# Patient Record
Sex: Female | Born: 1940 | Race: White | Hispanic: No | Smoking: Never smoker
Health system: Southern US, Community
[De-identification: ages and names within clinical notes are randomized; demographics above are authoritative.]

## PROBLEM LIST (undated history)

## (undated) DIAGNOSIS — I2510 Atherosclerotic heart disease of native coronary artery without angina pectoris: Secondary | ICD-10-CM

## (undated) DIAGNOSIS — C801 Malignant (primary) neoplasm, unspecified: Secondary | ICD-10-CM

## (undated) DIAGNOSIS — F329 Major depressive disorder, single episode, unspecified: Secondary | ICD-10-CM

## (undated) DIAGNOSIS — E785 Hyperlipidemia, unspecified: Secondary | ICD-10-CM

## (undated) DIAGNOSIS — E669 Obesity, unspecified: Secondary | ICD-10-CM

## (undated) DIAGNOSIS — Z923 Personal history of irradiation: Secondary | ICD-10-CM

## (undated) DIAGNOSIS — L821 Other seborrheic keratosis: Secondary | ICD-10-CM

## (undated) DIAGNOSIS — F32A Depression, unspecified: Secondary | ICD-10-CM

## (undated) DIAGNOSIS — I519 Heart disease, unspecified: Secondary | ICD-10-CM

## (undated) DIAGNOSIS — Z780 Asymptomatic menopausal state: Secondary | ICD-10-CM

## (undated) DIAGNOSIS — R7309 Other abnormal glucose: Secondary | ICD-10-CM

## (undated) DIAGNOSIS — Z803 Family history of malignant neoplasm of breast: Secondary | ICD-10-CM

## (undated) DIAGNOSIS — E538 Deficiency of other specified B group vitamins: Secondary | ICD-10-CM

## (undated) DIAGNOSIS — C50919 Malignant neoplasm of unspecified site of unspecified female breast: Secondary | ICD-10-CM

## (undated) HISTORY — DX: Obesity, unspecified: E66.9

## (undated) HISTORY — DX: Other seborrheic keratosis: L82.1

## (undated) HISTORY — DX: Depression, unspecified: F32.A

## (undated) HISTORY — DX: Malignant neoplasm of unspecified site of unspecified female breast: C50.919

## (undated) HISTORY — DX: Atherosclerotic heart disease of native coronary artery without angina pectoris: I25.10

## (undated) HISTORY — DX: Family history of malignant neoplasm of breast: Z80.3

## (undated) HISTORY — DX: Hyperlipidemia, unspecified: E78.5

## (undated) HISTORY — DX: Deficiency of other specified B group vitamins: E53.8

## (undated) HISTORY — DX: Asymptomatic menopausal state: Z78.0

## (undated) HISTORY — PX: TUBAL LIGATION: SHX77

## (undated) HISTORY — PX: TONSILLECTOMY: SUR1361

## (undated) HISTORY — DX: Other abnormal glucose: R73.09

## (undated) HISTORY — DX: Heart disease, unspecified: I51.9

## (undated) HISTORY — PX: NECK SURGERY: SHX720

## (undated) HISTORY — DX: Major depressive disorder, single episode, unspecified: F32.9

## (undated) HISTORY — PX: CHOLECYSTECTOMY: SHX55

---

## 2001-12-06 ENCOUNTER — Other Ambulatory Visit: Admission: RE | Admit: 2001-12-06 | Discharge: 2001-12-06 | Payer: Self-pay | Admitting: Family Medicine

## 2001-12-28 HISTORY — PX: CARDIAC CATHETERIZATION: SHX172

## 2002-01-10 ENCOUNTER — Ambulatory Visit (HOSPITAL_COMMUNITY): Admission: RE | Admit: 2002-01-10 | Discharge: 2002-01-10 | Payer: Self-pay | Admitting: Cardiology

## 2003-08-16 ENCOUNTER — Other Ambulatory Visit: Admission: RE | Admit: 2003-08-16 | Discharge: 2003-08-16 | Payer: Self-pay | Admitting: Family Medicine

## 2003-10-03 ENCOUNTER — Ambulatory Visit (HOSPITAL_COMMUNITY): Admission: RE | Admit: 2003-10-03 | Discharge: 2003-10-03 | Payer: Self-pay | Admitting: Family Medicine

## 2003-10-09 ENCOUNTER — Inpatient Hospital Stay (HOSPITAL_COMMUNITY): Admission: EM | Admit: 2003-10-09 | Discharge: 2003-10-10 | Payer: Self-pay | Admitting: Emergency Medicine

## 2004-11-09 ENCOUNTER — Ambulatory Visit: Payer: Self-pay | Admitting: Family Medicine

## 2004-11-09 ENCOUNTER — Other Ambulatory Visit: Admission: RE | Admit: 2004-11-09 | Discharge: 2004-11-09 | Payer: Self-pay | Admitting: Family Medicine

## 2005-06-09 ENCOUNTER — Ambulatory Visit: Payer: Self-pay | Admitting: Family Medicine

## 2005-06-21 ENCOUNTER — Ambulatory Visit: Payer: Self-pay

## 2005-07-19 ENCOUNTER — Ambulatory Visit: Payer: Self-pay | Admitting: Family Medicine

## 2005-11-23 ENCOUNTER — Ambulatory Visit: Payer: Self-pay | Admitting: Family Medicine

## 2006-01-18 ENCOUNTER — Ambulatory Visit: Payer: Self-pay | Admitting: Family Medicine

## 2006-03-07 ENCOUNTER — Ambulatory Visit: Payer: Self-pay | Admitting: Family Medicine

## 2007-07-13 ENCOUNTER — Ambulatory Visit: Payer: Self-pay | Admitting: Family Medicine

## 2007-07-13 DIAGNOSIS — I2510 Atherosclerotic heart disease of native coronary artery without angina pectoris: Secondary | ICD-10-CM | POA: Insufficient documentation

## 2007-07-13 DIAGNOSIS — E785 Hyperlipidemia, unspecified: Secondary | ICD-10-CM | POA: Insufficient documentation

## 2007-07-13 DIAGNOSIS — F329 Major depressive disorder, single episode, unspecified: Secondary | ICD-10-CM

## 2007-08-21 ENCOUNTER — Ambulatory Visit: Payer: Self-pay | Admitting: Family Medicine

## 2007-08-25 LAB — CONVERTED CEMR LAB
ALT: 18 units/L (ref 0–35)
BUN: 12 mg/dL (ref 6–23)
Bilirubin, Direct: 0.1 mg/dL (ref 0.0–0.3)
Calcium: 8.9 mg/dL (ref 8.4–10.5)
Eosinophils Absolute: 0.1 10*3/uL (ref 0.0–0.6)
Eosinophils Relative: 2.1 % (ref 0.0–5.0)
GFR calc Af Amer: 92 mL/min
GFR calc non Af Amer: 76 mL/min
Glucose, Bld: 110 mg/dL — ABNORMAL HIGH (ref 70–99)
Lymphocytes Relative: 34.2 % (ref 12.0–46.0)
MCHC: 35.9 g/dL (ref 30.0–36.0)
MCV: 90.8 fL (ref 78.0–100.0)
Monocytes Relative: 9.6 % (ref 3.0–11.0)
Neutro Abs: 2.8 10*3/uL (ref 1.4–7.7)
Platelets: 241 10*3/uL (ref 150–400)

## 2007-09-04 ENCOUNTER — Ambulatory Visit: Payer: Self-pay | Admitting: Family Medicine

## 2007-09-08 LAB — CONVERTED CEMR LAB
Direct LDL: 159.3 mg/dL
HDL: 46.1 mg/dL (ref >39.0)
Triglycerides: 124 mg/dL (ref 0–149)

## 2007-10-10 ENCOUNTER — Ambulatory Visit: Payer: Self-pay | Admitting: Family Medicine

## 2007-10-10 ENCOUNTER — Other Ambulatory Visit: Admission: RE | Admit: 2007-10-10 | Discharge: 2007-10-10 | Payer: Self-pay | Admitting: Family Medicine

## 2007-10-10 ENCOUNTER — Encounter: Payer: Self-pay | Admitting: Family Medicine

## 2007-10-10 DIAGNOSIS — R7309 Other abnormal glucose: Secondary | ICD-10-CM

## 2007-10-16 ENCOUNTER — Encounter (INDEPENDENT_AMBULATORY_CARE_PROVIDER_SITE_OTHER): Payer: Self-pay | Admitting: *Deleted

## 2007-10-23 ENCOUNTER — Ambulatory Visit: Payer: Self-pay | Admitting: Family Medicine

## 2007-10-25 LAB — CONVERTED CEMR LAB
Hgb A1c MFr Bld: 5.9 % (ref 4.6–6.0)
Total CHOL/HDL Ratio: 3.9
Triglycerides: 122 mg/dL (ref 0–149)
VLDL: 24 mg/dL (ref 0–40)

## 2008-02-19 ENCOUNTER — Encounter: Payer: Self-pay | Admitting: Family Medicine

## 2008-02-21 ENCOUNTER — Encounter: Payer: Self-pay | Admitting: Family Medicine

## 2008-02-27 ENCOUNTER — Encounter (INDEPENDENT_AMBULATORY_CARE_PROVIDER_SITE_OTHER): Payer: Self-pay | Admitting: *Deleted

## 2008-10-10 ENCOUNTER — Emergency Department (HOSPITAL_COMMUNITY): Admission: EM | Admit: 2008-10-10 | Discharge: 2008-10-11 | Payer: Self-pay | Admitting: Emergency Medicine

## 2009-04-29 ENCOUNTER — Encounter: Payer: Self-pay | Admitting: Family Medicine

## 2009-05-08 ENCOUNTER — Encounter (INDEPENDENT_AMBULATORY_CARE_PROVIDER_SITE_OTHER): Payer: Self-pay | Admitting: *Deleted

## 2009-07-15 ENCOUNTER — Ambulatory Visit: Payer: Self-pay | Admitting: Family Medicine

## 2009-07-15 DIAGNOSIS — E669 Obesity, unspecified: Secondary | ICD-10-CM

## 2009-07-17 LAB — CONVERTED CEMR LAB
ALT: 19 units/L (ref 0–35)
AST: 23 units/L (ref 0–37)
Alkaline Phosphatase: 69 units/L (ref 39–117)
CO2: 31 meq/L (ref 19–32)
Chloride: 102 meq/L (ref 96–112)
Cholesterol: 293 mg/dL — ABNORMAL HIGH (ref 0–200)
Eosinophils Relative: 1.8 % (ref 0.0–5.0)
GFR calc non Af Amer: 75.75 mL/min (ref >60)
HCT: 43.1 % (ref 36.0–46.0)
Hemoglobin: 14.5 g/dL (ref 12.0–15.0)
Hgb A1c MFr Bld: 5.8 % (ref 4.6–6.5)
Lymphs Abs: 1.5 10*3/uL (ref 0.7–4.0)
Monocytes Relative: 9.4 % (ref 3.0–12.0)
Neutro Abs: 3.1 10*3/uL (ref 1.4–7.7)
Potassium: 4.2 meq/L (ref 3.5–5.1)
RBC: 4.55 M/uL (ref 3.87–5.11)
Sodium: 141 meq/L (ref 135–145)
TSH: 1.94 microintl units/mL (ref 0.35–5.50)
Total CHOL/HDL Ratio: 6
WBC: 5.2 10*3/uL (ref 4.5–10.5)

## 2009-07-29 ENCOUNTER — Ambulatory Visit: Payer: Self-pay | Admitting: Family Medicine

## 2009-08-04 ENCOUNTER — Encounter: Payer: Self-pay | Admitting: Family Medicine

## 2009-09-18 ENCOUNTER — Encounter: Payer: Self-pay | Admitting: Family Medicine

## 2009-09-19 ENCOUNTER — Ambulatory Visit (HOSPITAL_COMMUNITY)
Admission: RE | Admit: 2009-09-19 | Discharge: 2009-09-19 | Payer: Self-pay | Source: Home / Self Care | Admitting: General Surgery

## 2009-09-26 ENCOUNTER — Encounter
Admission: RE | Admit: 2009-09-26 | Discharge: 2009-12-25 | Payer: Self-pay | Source: Home / Self Care | Admitting: General Surgery

## 2009-09-30 ENCOUNTER — Encounter (INDEPENDENT_AMBULATORY_CARE_PROVIDER_SITE_OTHER): Payer: Self-pay

## 2009-10-01 ENCOUNTER — Ambulatory Visit: Payer: Self-pay | Admitting: Gastroenterology

## 2009-10-20 ENCOUNTER — Ambulatory Visit: Payer: Self-pay | Admitting: Gastroenterology

## 2009-10-20 LAB — HM COLONOSCOPY

## 2009-10-22 ENCOUNTER — Encounter: Payer: Self-pay | Admitting: Gastroenterology

## 2009-11-27 ENCOUNTER — Ambulatory Visit (HOSPITAL_COMMUNITY): Admission: RE | Admit: 2009-11-27 | Discharge: 2009-11-27 | Payer: Self-pay | Admitting: General Surgery

## 2009-12-25 ENCOUNTER — Encounter
Admission: RE | Admit: 2009-12-25 | Discharge: 2010-03-25 | Payer: Self-pay | Source: Home / Self Care | Admitting: General Surgery

## 2010-01-13 ENCOUNTER — Inpatient Hospital Stay (HOSPITAL_COMMUNITY)
Admission: RE | Admit: 2010-01-13 | Discharge: 2010-01-15 | Payer: Self-pay | Source: Home / Self Care | Admitting: General Surgery

## 2010-01-13 HISTORY — PX: LAPAROSCOPIC GASTRIC BANDING: SHX1100

## 2010-02-05 ENCOUNTER — Encounter: Payer: Self-pay | Admitting: Family Medicine

## 2010-04-28 ENCOUNTER — Encounter
Admission: RE | Admit: 2010-04-28 | Discharge: 2010-05-29 | Payer: Self-pay | Source: Home / Self Care | Admitting: General Surgery

## 2010-05-27 IMAGING — CR DG ABDOMEN 1V
1 series · 1 of 1 positions shown · non-contrast
Comparison: None.

CLINICAL DATA: Postop gastric banding procedure.  Nausea.

ABDOMEN - 1 VIEW

[t abdomen supine]
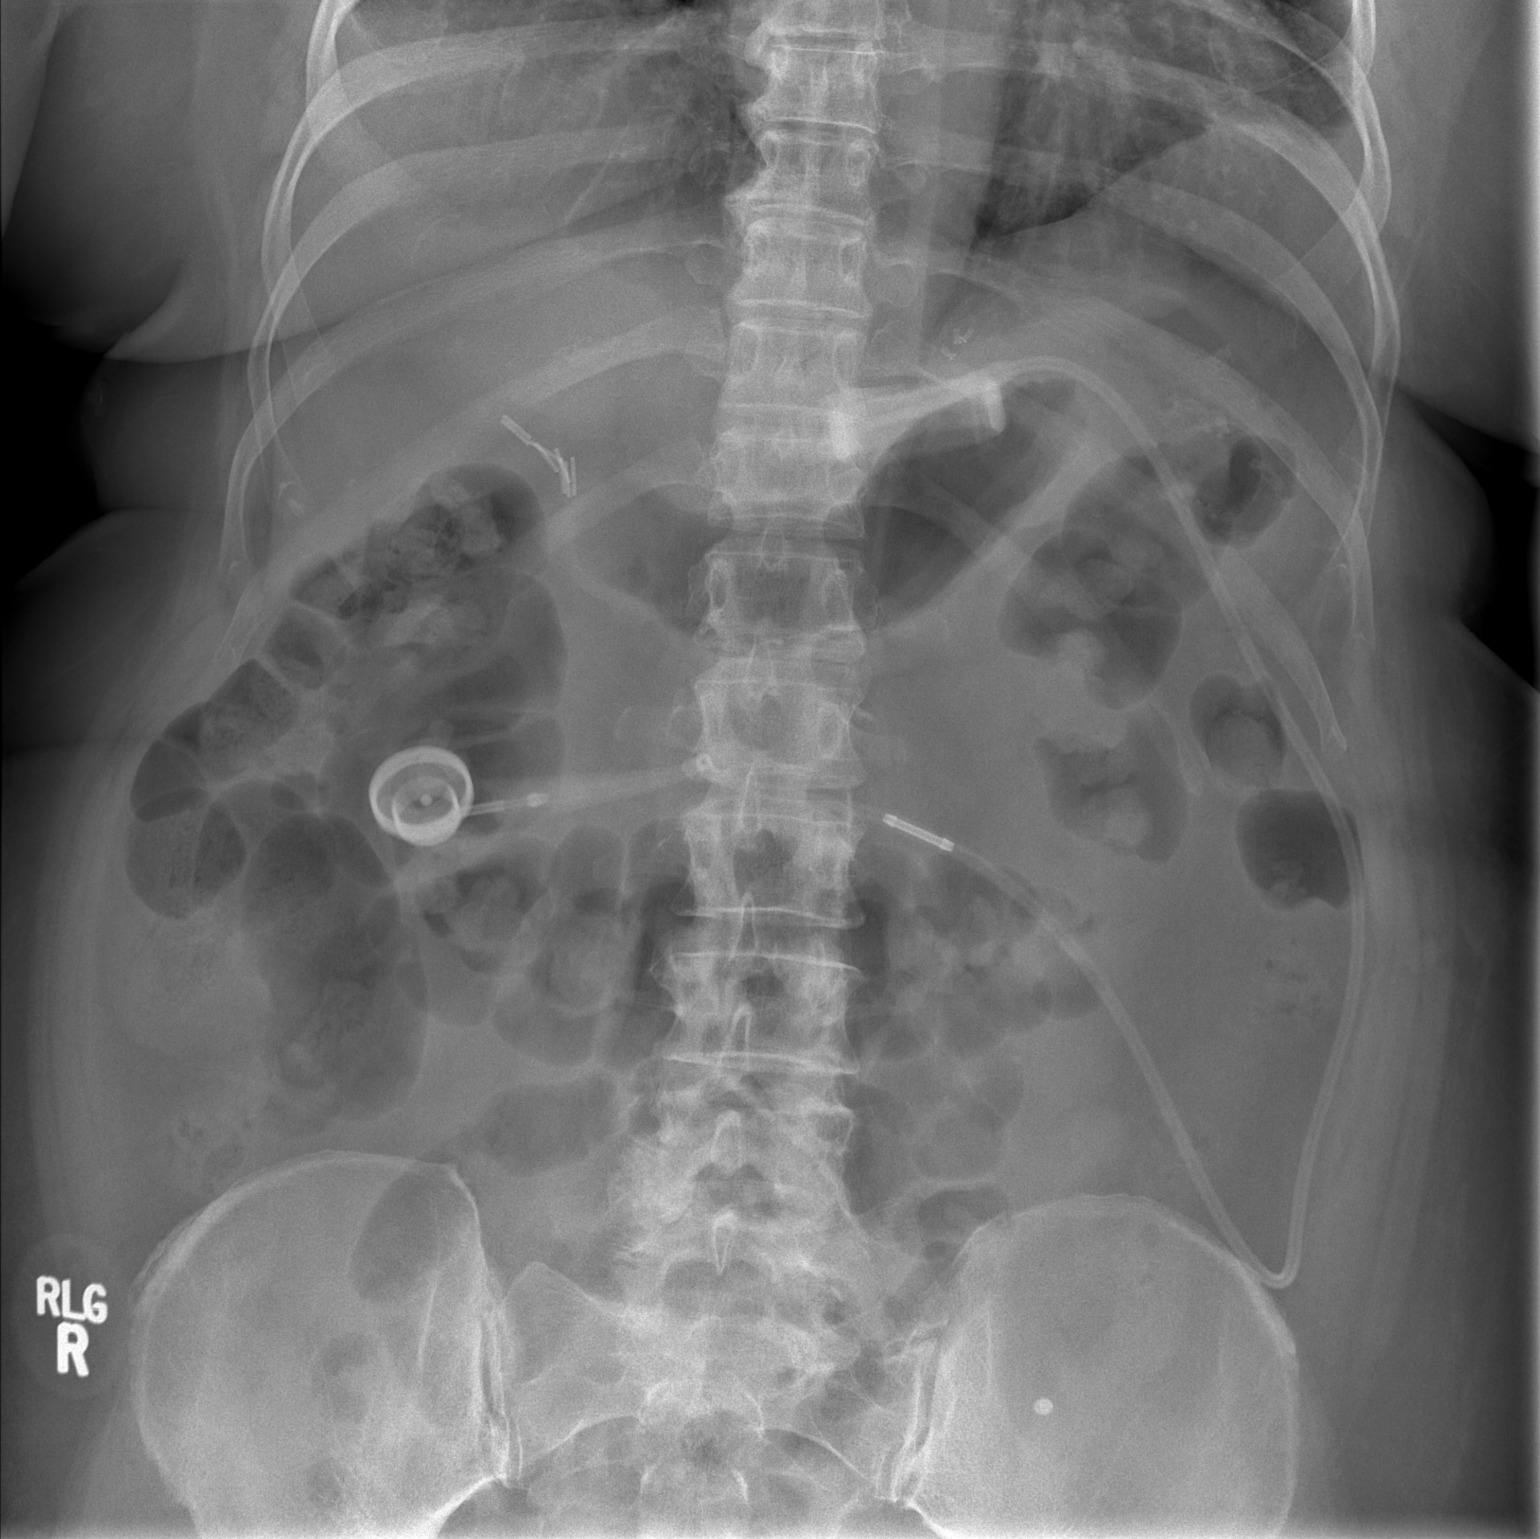

[1 of 1 positions shown; findings below may reference images not displayed]

FINDINGS: Single view of the abdomen was obtained.  There is a
gastric banding device in the upper abdomen near the GE junction.
There is continuity of the banding tube.  The patient has
cholecystectomy clips in the right upper quadrant.  The banding
device is nearly horizontal with a slight tilt towards the 2
o'clock and 8 o'clock position. Nonspecific bowel gas pattern with
gas in the small and large bowel.  Question a small BB or foreign
body overlying the left iliac crest.
IMPRESSION: Status post gastric banding procedure.  No gross abnormality to the
banding device.

Nonspecific bowel gas pattern.

## 2010-09-08 ENCOUNTER — Other Ambulatory Visit: Payer: Self-pay | Admitting: Family Medicine

## 2010-09-08 ENCOUNTER — Ambulatory Visit
Admission: RE | Admit: 2010-09-08 | Discharge: 2010-09-08 | Payer: Self-pay | Source: Home / Self Care | Attending: Family Medicine | Admitting: Family Medicine

## 2010-09-08 DIAGNOSIS — Z9884 Bariatric surgery status: Secondary | ICD-10-CM | POA: Insufficient documentation

## 2010-09-08 DIAGNOSIS — L821 Other seborrheic keratosis: Secondary | ICD-10-CM | POA: Insufficient documentation

## 2010-09-08 LAB — CBC WITH DIFFERENTIAL/PLATELET
Basophils Absolute: 0 10*3/uL (ref 0.0–0.1)
Basophils Relative: 0.5 % (ref 0.0–3.0)
Eosinophils Absolute: 0.1 10*3/uL (ref 0.0–0.7)
Eosinophils Relative: 2 % (ref 0.0–5.0)
HCT: 39.7 % (ref 36.0–46.0)
Hemoglobin: 13.4 g/dL (ref 12.0–15.0)
Lymphocytes Relative: 29.8 % (ref 12.0–46.0)
Lymphs Abs: 1.6 10*3/uL (ref 0.7–4.0)
MCHC: 33.8 g/dL (ref 30.0–36.0)
MCV: 92.4 fl (ref 78.0–100.0)
Monocytes Absolute: 0.5 10*3/uL (ref 0.1–1.0)
Monocytes Relative: 9.6 % (ref 3.0–12.0)
Neutro Abs: 3.2 10*3/uL (ref 1.4–7.7)
Neutrophils Relative %: 58.1 % (ref 43.0–77.0)
Platelets: 280 10*3/uL (ref 150.0–400.0)
RBC: 4.29 Mil/uL (ref 3.87–5.11)
RDW: 13.1 % (ref 11.5–14.6)
WBC: 5.5 10*3/uL (ref 4.5–10.5)

## 2010-09-08 LAB — LIPID PANEL
Cholesterol: 273 mg/dL — ABNORMAL HIGH (ref 0–200)
HDL: 50.9 mg/dL (ref >39.00)
Total CHOL/HDL Ratio: 5
Triglycerides: 112 mg/dL (ref 0.0–149.0)
VLDL: 22.4 mg/dL (ref 0.0–40.0)

## 2010-09-08 LAB — B12 AND FOLATE PANEL
Folate: 10.9 ng/mL
Vitamin B-12: 214 pg/mL (ref 211–911)

## 2010-09-08 LAB — ALT: ALT: 12 U/L (ref 0–35)

## 2010-09-08 LAB — LDL CHOLESTEROL, DIRECT: Direct LDL: 211.6 mg/dL

## 2010-09-08 LAB — AST: AST: 20 U/L (ref 0–37)

## 2010-09-08 LAB — HEMOGLOBIN A1C: Hgb A1c MFr Bld: 5.7 % (ref 4.6–6.5)

## 2010-09-16 ENCOUNTER — Encounter: Payer: Self-pay | Admitting: Family Medicine

## 2010-09-17 ENCOUNTER — Ambulatory Visit
Admission: RE | Admit: 2010-09-17 | Discharge: 2010-09-17 | Payer: Self-pay | Source: Home / Self Care | Attending: Family Medicine | Admitting: Family Medicine

## 2010-09-19 ENCOUNTER — Encounter: Payer: Self-pay | Admitting: Family Medicine

## 2010-09-22 ENCOUNTER — Encounter: Payer: Self-pay | Admitting: Family Medicine

## 2010-09-22 ENCOUNTER — Encounter (INDEPENDENT_AMBULATORY_CARE_PROVIDER_SITE_OTHER): Payer: Self-pay | Admitting: *Deleted

## 2010-09-24 ENCOUNTER — Ambulatory Visit
Admission: RE | Admit: 2010-09-24 | Discharge: 2010-09-24 | Payer: Self-pay | Source: Home / Self Care | Attending: Family Medicine | Admitting: Family Medicine

## 2010-09-29 NOTE — Procedures (Signed)
Summary: Colonoscopy  Patient: Jessica Livingston Note: All result statuses are Final unless otherwise noted.  Tests: (1) Colonoscopy (COL)   COL Colonoscopy           DONE     [facility]     [address]     [place], [place]  [address]           COLONOSCOPY PROCEDURE REPORT           PATIENT:  [name], [name]  MR#:  [id number]     BIRTHDATE:  Nov 11, 1940, 68 yrs. old  GENDER:  female           ENDOSCOPIST:  Vania Rea. Jarold Motto, MD, [facility]     Referred by:  Glenna Fellows, M.D.           PROCEDURE DATE:  10/20/2009     PROCEDURE:  Colonoscopy with snare polypectomy     ASA CLASS:  Class II     INDICATIONS:  Routine Risk Screening           MEDICATIONS:   Fentanyl 75 mcg IV, Versed 8 mg IV           DESCRIPTION OF PROCEDURE:   After the risks benefits and     alternatives of the procedure were thoroughly explained, informed     consent was obtained.  Digital rectal exam was performed and     revealed no abnormalities.   The LB [id number] endoscope     was introduced through the anus and advanced to the cecum, which     was identified by both the appendix and ileocecal valve, without     limitations.  The quality of the prep was excellent, using     MoviPrep.  The instrument was then slowly withdrawn as the colon     was fully examined.     <<PROCEDUREIMAGES>>           FINDINGS:  There were multiple polyps identified and removed. in     the rectum and sigmoid colon. 3-71mm polyps hot snaes excised.see     pictures.  This was otherwise a normal examination of the colon.     Retroflexed views in the rectum revealed no abnormalities.    The     scope was then withdrawn from the patient and the procedure     completed.           COMPLICATIONS:  None           ENDOSCOPIC IMPRESSION:     1) Polyps, multiple in the rectum and sigmoid colon     2) Otherwise normal examination     multiple adenomas.     RECOMMENDATIONS:     1) If the polyp(s) removed today are proven  to be adenomatous     (pre-cancerous) polyps, you will need a colonoscopy in 3 years.     Otherwise you should continue to follow colorectal cancer     screening guidelines for "routine risk" patients with a     colonoscopy in 10 years.           REPEAT EXAM:  No           ______________________________     Vania Rea. Jarold Motto, MD, [facility]           CC:  Judy Pimple, MD           n.     Rosalie DoctorVania Rea. Patterson at 10/20/2009 11:26  AM           Charyl Bigger, [id number]  Note: An exclamation mark (!) indicates a result that was not dispersed into the flowsheet. Document Creation Date: 10/20/2009 11:26 AM _______________________________________________________________________  (1) Order result status: Final Collection or observation date-time: 10/20/2009 11:18 Requested date-time:  Receipt date-time:  Reported date-time:  Referring Physician:   Ordering Physician: Sheryn Bison [phone]) Specimen Source:  Source: Launa Grill Order Number: [id number] Lab site:   Appended Document: Colonoscopy     Procedures Next Due Date:    Colonoscopy: 10/2012

## 2010-09-29 NOTE — Miscellaneous (Signed)
Summary: Lec previsit  Clinical Lists Changes  Medications: Added new medication of MOVIPREP 100 GM  SOLR (PEG-KCL-NACL-NASULF-NA ASC-C) As per prep instructions. - Signed Rx of MOVIPREP 100 GM  SOLR (PEG-KCL-NACL-NASULF-NA ASC-C) As per prep instructions.;  #1 x 0;  Signed;  Entered by: Ulis Rias RN;  Authorized by: Mardella Layman MD [facility];  Method used: Electronically to [facility]*, [address] [place], [place]  [address], Ph: [phone] or [phone], Fax: [phone] Observations: Added new observation of ALLERGY REV: Done (10/01/2009 15:19)    Prescriptions: MOVIPREP 100 GM  SOLR (PEG-KCL-NACL-NASULF-NA ASC-C) As per prep instructions.  #1 x 0   Entered by:   Ulis Rias RN   Authorized by:   Mardella Layman MD [facility]   Signed by:   Ulis Rias RN on 10/01/2009   Method used:   Electronically to        [facility]* (retail)       [address] [place], [place]  [address]       Ph: [phone] or [phone]       Fax: [phone]   RxID:   [id number]

## 2010-09-29 NOTE — Letter (Signed)
Summary: Patient Notice- Polyp Results  [facility]  [address] [place], [place] [address]   Phone: [phone]  Fax: [phone]        October 22, 2009 MRN: [id number]    SHARLETT LIENEMANN [address] [place], [place]  [address]    Dear Ms. SANJUAN,  I am pleased to inform you that the colon polyp(s) removed during your recent colonoscopy was (were) found to be benign (no cancer detected) upon pathologic examination.  I recommend you have a repeat colonoscopy examination in 3_ years to look for recurrent polyps, as having colon polyps increases your risk for having recurrent polyps or even colon cancer in the future.  Should you develop new or worsening symptoms of abdominal pain, bowel habit changes or bleeding from the rectum or bowels, please schedule an evaluation with either your primary care physician or with me.  Additional information/recommendations:  _x_ No further action with gastroenterology is needed at this time. Please      follow-up with your primary care physician for your other healthcare      needs.  __ Please call [phone] to schedule a return visit to review your      situation.  __ Please keep your follow-up visit as already scheduled.  __ Continue treatment plan as outlined the day of your exam.  Please call us if you are having persistent problems or have questions about your condition that have not been fully answered at this time.  Sincerely,  Mardella Layman MD [facility]  This letter has been electronically signed by your physician.  Appended Document: Patient Notice- Polyp Results  Letter mailed 2.24.11

## 2010-09-29 NOTE — Letter (Signed)
Summary: [facility]   Imported By: Lanelle Bal 02/14/2010 09:49:58  _____________________________________________________________________  External Attachment:    Type:   Image     Comment:   External Document

## 2010-09-29 NOTE — Consult Note (Signed)
Summary: [facility]   Imported By: Lanelle Bal 10/17/2009 12:49:53  _____________________________________________________________________  External Attachment:    Type:   Image     Comment:   External Document

## 2010-09-29 NOTE — Letter (Signed)
Summary: [facility] Instructions  [facility]  [address] [place], [place] [address]   Phone: [phone]  Fax: [phone]       Jessica Livingston    1941-03-24    MRN: [id number]        Procedure Day /Date:  Monday 10/20/09     Arrival Time:  9:30am     Procedure Time:  10:30am     Location of Procedure:                    _X _  [facility] (4th Floor)                        PREPARATION FOR COLONOSCOPY WITH MOVIPREP   Starting 5 days prior to your procedure   Wednesday 02/16   do not eat nuts, seeds, popcorn, corn, beans, peas,  salads, or any raw vegetables.  Do not take any fiber supplements (e.g. Metamucil, Citrucel, and Benefiber).  THE DAY BEFORE YOUR PROCEDURE         DATE:  02/20   DAY:  Sunday 1.  Drink clear liquids the entire day-NO SOLID FOOD  2.  Do not drink anything colored red or purple.  Avoid juices with pulp.  No orange juice.  3.  Drink at least 64 oz. (8 glasses) of fluid/clear liquids during the day to prevent dehydration and help the prep work efficiently.  CLEAR LIQUIDS INCLUDE: Water Jello Ice Popsicles Tea (sugar ok, no milk/cream) Powdered fruit flavored drinks Coffee (sugar ok, no milk/cream) Gatorade Juice: apple, white grape, white cranberry  Lemonade Clear bullion, consomm, broth Carbonated beverages (any kind) Strained chicken noodle soup Hard Candy                             4.  In the morning, mix first dose of MoviPrep solution:    Empty 1 Pouch A and 1 Pouch B into the disposable container    Add lukewarm drinking water to the top line of the container. Mix to dissolve    Refrigerate (mixed solution should be used within 24 hrs)  5.  Begin drinking the prep at 5:00 p.m. The MoviPrep container is divided by 4 marks.   Every 15 minutes drink the solution down to the next mark (approximately 8 oz) until the full liter is complete.   6.  Follow completed prep with 16 oz of clear liquid of your  choice (Nothing red or purple).  Continue to drink clear liquids until bedtime.  7.  Before going to bed, mix second dose of MoviPrep solution:    Empty 1 Pouch A and 1 Pouch B into the disposable container    Add lukewarm drinking water to the top line of the container. Mix to dissolve    Refrigerate  THE DAY OF YOUR PROCEDURE      DATE:  02/21  DAY:  Monday  Beginning at  5:30 a.m. (5 hours before procedure):         1. Every 15 minutes, drink the solution down to the next mark (approx 8 oz) until the full liter is complete.  2. Follow completed prep with 16 oz. of clear liquid of your choice.    3. You may drink clear liquids until   8:30am  (2 HOURS BEFORE PROCEDURE).   MEDICATION INSTRUCTIONS  Unless otherwise instructed, you should take regular prescription medications with  a small sip of water   as early as possible the morning of your procedure.         OTHER INSTRUCTIONS  You will need a responsible adult at least 71 years of age to accompany you and drive you home.   This person must remain in the waiting room during your procedure.  Wear loose fitting clothing that is easily removed.  Leave jewelry and other valuables at home.  However, you may wish to bring a book to read or  an iPod/MP3 player to listen to music as you wait for your procedure to start.  Remove all body piercing jewelry and leave at home.  Total time from sign-in until discharge is approximately 2-3 hours.  You should go home directly after your procedure and rest.  You can resume normal activities the  day after your procedure.  The day of your procedure you should not:   Drive   Make legal decisions   Operate machinery   Drink alcohol   Return to work  You will receive specific instructions about eating, activities and medications before you leave.    The above instructions have been reviewed and explained to me by   Ulis Rias RN  October 01, 2009 3:58 PM     I  fully understand and can verbalize these instructions _____________________________ Date _________

## 2010-10-01 ENCOUNTER — Ambulatory Visit: Admit: 2010-10-01 | Payer: Self-pay | Admitting: Family Medicine

## 2010-10-01 ENCOUNTER — Ambulatory Visit: Payer: MEDICARE

## 2010-10-01 ENCOUNTER — Encounter: Payer: Self-pay | Admitting: Family Medicine

## 2010-10-01 DIAGNOSIS — Z9884 Bariatric surgery status: Secondary | ICD-10-CM

## 2010-10-01 NOTE — Assessment & Plan Note (Signed)
Summary: CHECK SPOT ON LEFT BREAST/[facility] PT WANTS  CHOL LAB WORK   Vital Signs:  Patient profile:   70 year old female Height:      62.5 inches Weight:      178.25 pounds BMI:     32.20 Temp:     98.1 degrees F oral Pulse rate:   60 / minute Pulse rhythm:   regular BP sitting:   130 / 70  (left arm) Cuff size:   regular  Vitals Entered By: Lewanda Rife LPN (10/02/10 8:22 AM) CC: ck spot or mole on lt breast and wants cholesterol lab work   History of Present Illness: has 2 moles to check on L breast -- sometimes throws off mammogram   not getting bigger   wants to check cholesterol  decided to stop her zocor after a month   wt down from 215 to 178 with lap band  eating small amounts and slowly  getting enough protien , some fruits , more vegetables  is not exercising -- except shopping     bad summer - mother died of breast cancer -- 68 years old  tough emotionally        Allergies: 1)  ! Lipitor  Past History:  Past Medical History: Last updated: 07/13/2007 Coronary artery disease Depression Hyperlipidemia  Family History: Last updated: 10/02/2010 Father: deceased age 69- MI Mother: pacemaker, ETOH, smoking, thyroid problems, breast cancer, HTN, high cholesterol (died of breast cancer)  Siblings:  PGM DM  Social History: Last updated: 10/10/2007 Marital Status: Married Children: 3- 1 son, suicide Occupation: retired Never Smoked takes art classed- oil painting  Risk Factors: Smoking Status: never (10/10/2007)  Past Surgical History: Cholecystectomy Tubal ligation Tonsillectomy Exercise treadmill 30865) Cath- mild to mod single vessel disease (12/2001) C-S surgery (10/2003) Carotid dopplers- neg (05/2005) lap band for wt loss 2011  Family History: Father: deceased age 63- MI Mother: pacemaker, ETOH, smoking, thyroid problems, breast cancer, HTN, high cholesterol (died of breast cancer)  Siblings:  PGM DM  Review of  Systems General:  Denies fatigue, malaise, and sweats. Eyes:  Denies blurring and eye irritation. CV:  Denies chest pain or discomfort, lightheadness, and palpitations. Resp:  Denies cough, shortness of breath, and wheezing. GI:  Denies abdominal pain, change in bowel habits, indigestion, and nausea. GU:  Denies dysuria and urinary frequency. MS:  Denies joint pain, joint redness, and joint swelling. Derm:  Complains of lesion(s); denies itching, poor wound healing, and rash. Neuro:  Denies numbness and tingling. Psych:  mood is ok -- now . Endo:  Denies cold intolerance, excessive thirst, excessive urination, and heat intolerance. Heme:  Denies abnormal bruising and bleeding.  Physical Exam  General:  overweight but generally well appearing  wt loss noted  Head:  normocephalic, atraumatic, and no abnormalities observed.   Eyes:  vision grossly intact, pupils equal, pupils round, and pupils reactive to light.  no conjunctival pallor, injection or icterus  Nose:  no nasal discharge.   Mouth:  pharynx pink and moist.   Neck:  supple with full rom and no masses or thyromegally, no JVD or carotid bruit  Chest Wall:  No deformities, masses, or tenderness noted. Lungs:  Normal respiratory effort, chest expands symmetrically. Lungs are clear to auscultation, no crackles or wheezes. Heart:  Normal rate and regular rhythm. S1 and S2 normal without gallop, murmur, click, rub or other extra sounds. Abdomen:  soft and non-tender.  no renal bruits  Msk:  No deformity  or scoliosis noted of thoracic or lumbar spine.   no acute joint changes  Pulses:  R and L carotid,radial,femoral,dorsalis pedis and posterior tibial pulses are full and equal bilaterally Extremities:  No clubbing, cyanosis, edema, or deformity noted with normal full range of motion of all joints.   Neurologic:  sensation intact to light touch, gait normal, and DTRs symmetrical and normal.   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) Assessment Unchanged  these were treated (2 large on breast) with liquid nitrogen  pt tol well  disc aftercare   Orders: Cryotherapy/Destruction benign or premalignant lesion (1st lesion)  (17000) Cryotherapy/Destruction benign or premalignant lesion (2nd-14th lesions) (17003)  Problem # 2:  BARIATRIC SURGERY STATUS (ICD-V45.86) doing well with wt loss so far  lab today  rev diet and need for protien and vitamin supplementation Orders: Venipuncture (47829) TLB-Lipid Panel (80061-LIPID) TLB-ALT (SGPT) (84460-ALT) TLB-AST (SGOT) (84450-SGOT) TLB-B12 + Folate Pnl (82746_82607-B12/FOL) TLB-A1C / Hgb A1C (Glycohemoglobin) (83036-A1C) TLB-CBC Platelet - w/Differential (85025-CBCD)  Problem # 3:  HYPERGLYCEMIA (ICD-790.29) Assessment: Unchanged  check AIC -- expect imp with wt loss after lap band  Orders: Venipuncture (56213) TLB-Lipid Panel (80061-LIPID) TLB-ALT (SGPT) (84460-ALT) TLB-AST (SGOT) (84450-SGOT) TLB-B12 + Folate Pnl (08657_84696-E95/MWU) TLB-A1C / Hgb A1C (Glycohemoglobin) (83036-A1C) TLB-CBC Platelet - w/Differential (85025-CBCD)  Labs Reviewed: Creat: 0.8 (07/15/2009)     Problem # 4:  HYPERLIPIDEMIA (ICD-272.4) Assessment: Unchanged  lipid check today hope for improvement  if not - 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 1 by mouth once daily  Orders: Venipuncture (13244) TLB-Lipid Panel (80061-LIPID) TLB-ALT (SGPT) (84460-ALT) TLB-AST (SGOT) (84450-SGOT) TLB-B12 + Folate Pnl (01027_25366-Y40/HKV) TLB-A1C / Hgb A1C (Glycohemoglobin) (83036-A1C) TLB-CBC Platelet - w/Differential  (85025-CBCD)  Labs Reviewed: SGOT: 23 (07/15/2009)   SGPT: 19 (07/15/2009)   HDL:45.60 (07/15/2009), 46.1 (10/23/2007)  LDL:111 (10/23/2007), DEL (09/04/2007)  Chol:293 (07/15/2009), 182 (10/23/2007)  Trig:141.0 (07/15/2009), 122 (10/23/2007)  Complete Medication List: 1)  Meclizine Hcl 25 Mg Tabs (Meclizine hcl) .... 1/2 to 1 by mouth three times a day as needed for vertigo 2)  Zocor 40 Mg Tabs (Simvastatin) .Marland Kitchen.. 1 by mouth once daily  Patient Instructions: 1)  keep skin areas clean and dry 2)  antibiotic ointment is ok until healed 3)  labs today 4)  great job with the weight loss    Orders Added: 1)  Venipuncture [36415] 2)  TLB-Lipid Panel [80061-LIPID] 3)  TLB-ALT (SGPT) [84460-ALT] 4)  TLB-AST (SGOT) [84450-SGOT] 5)  TLB-B12 + Folate Pnl [82746_82607-B12/FOL] 6)  TLB-A1C / Hgb A1C (Glycohemoglobin) [83036-A1C] 7)  TLB-CBC Platelet - w/Differential [85025-CBCD] 8)  Cryotherapy/Destruction benign or premalignant lesion (1st lesion)  [17000] 9)  Cryotherapy/Destruction benign or premalignant lesion (2nd-14th lesions) [17003] 10)  Est. Patient Level III [42595]    Current Allergies (reviewed today): ! LIPITOR

## 2010-10-01 NOTE — Assessment & Plan Note (Signed)
Summary: Vit B12//kad  Nurse Visit   Allergies: 1)  ! Lipitor  Medication Administration  Injection # 1:    Medication: Vit B12 1000 mcg    Diagnosis: BARIATRIC SURGERY STATUS (ICD-V45.86)    Route: IM    Site: R deltoid    Exp Date: 05/30/2012    Lot #: [id number]    Mfr: [employer]  Orders Added: 1)  Vit B12 1000 mcg [J3420] 2)  Admin of Therapeutic Inj  intramuscular or subcutaneous [96372]   Medication Administration  Injection # 1:    Medication: Vit B12 1000 mcg    Diagnosis: BARIATRIC SURGERY STATUS (ICD-V45.86)    Route: IM    Site: R deltoid    Exp Date: 05/30/2012    Lot #: [id number]    Mfr: [employer]  Orders Added: 1)  Vit B12 1000 mcg [J3420] 2)  Admin of Therapeutic Inj  intramuscular or subcutaneous [[address]]

## 2010-10-01 NOTE — Letter (Signed)
Summary: Results Follow up Letter  [facility]  [address] [place], [place] [address]   Phone: [phone]  Fax: [phone]    09/22/2010 MRN: [id number]  Jessica Livingston [address] [place], [place]  [address]  Dear [name],  The following are the results of your recent test(s):  Test         Result    Pap Smear:        Normal _____  Not Normal _____ Comments: ______________________________________________________ Cholesterol: LDL(Bad cholesterol):         Your goal is less than:         HDL (Good cholesterol):       Your goal is more than: Comments:  ______________________________________________________ Mammogram:        Normal __X___  Not Normal _____ Comments: Repeat in 1 year  ___________________________________________________________________ Hemoccult:        Normal _____  Not normal _______ Comments:    _____________________________________________________________________ Other Tests:    We routinely do not discuss normal results over the telephone.  If you desire a copy of the results, or you have any questions about this information we can discuss them at your next office visit.   Sincerely,       Sharilyn Sites for Dr. Roxy Manns

## 2010-10-01 NOTE — Miscellaneous (Signed)
Summary: Mammogram to flowsheet  Clinical Lists Changes  Observations: Added new observation of MAMMO DUE: 10/2011 (09/22/2010 9:31) Added new observation of MAMMOGRAM: normal (09/16/2010 9:31)      Preventive Care Screening  Mammogram:    Date:  09/16/2010    Next Due:  10/2011    Results:  normal

## 2010-10-07 NOTE — Assessment & Plan Note (Signed)
Summary: B12//kad  Nurse Visit   Allergies: 1)  ! Lipitor  Medication Administration  Injection # 1:    Medication: Vit B12 1000 mcg    Diagnosis: BARIATRIC SURGERY STATUS (ICD-V45.86)    Route: IM    Site: L deltoid    Exp Date: 05/30/2012    Lot #: [id number]    Mfr: [employer]    Patient tolerated injection without complications    Given by: Lewanda Rife LPN (September 29, 2010 8:40 AM)  Orders Added: 1)  Vit B12 1000 mcg [J3420] 2)  Admin of Therapeutic Inj  intramuscular or subcutaneous [96372]  Current Allergies: ! LIPITOR

## 2010-10-07 NOTE — Assessment & Plan Note (Signed)
Summary: NURSE VISIT  Nurse Visit   Allergies: 1)  ! Lipitor  Medication Administration  Injection # 1:    Medication: Vit B12 1000 mcg    Diagnosis: BARIATRIC SURGERY STATUS (ICD-V45.86)    Route: IM    Site: R deltoid    Exp Date: 02/28/2012    Lot #: [id number]    Mfr: [employer]    Patient tolerated injection without complications    Given by: Mervin Hack CMA (AAMA) (October 01, 2010 10:53 AM)  Orders Added: 1)  Vit B12 1000 mcg [J3420] 2)  Admin of Therapeutic Inj  intramuscular or subcutaneous [96372]   Medication Administration  Injection # 1:    Medication: Vit B12 1000 mcg    Diagnosis: BARIATRIC SURGERY STATUS (ICD-V45.86)    Route: IM    Site: R deltoid    Exp Date: 02/28/2012    Lot #: [id number]    Mfr: [employer]    Patient tolerated injection without complications    Given by: Mervin Hack CMA (AAMA) (October 01, 2010 10:53 AM)  Orders Added: 1)  Vit B12 1000 mcg [J3420] 2)  Admin of Therapeutic Inj  intramuscular or subcutaneous [19147]

## 2010-10-08 ENCOUNTER — Ambulatory Visit (INDEPENDENT_AMBULATORY_CARE_PROVIDER_SITE_OTHER): Payer: MEDICARE

## 2010-10-08 ENCOUNTER — Encounter: Payer: Self-pay | Admitting: Family Medicine

## 2010-10-08 DIAGNOSIS — Z9884 Bariatric surgery status: Secondary | ICD-10-CM

## 2010-10-15 NOTE — Assessment & Plan Note (Signed)
Summary: b12/kad  Nurse Visit   Allergies: 1)  ! Lipitor  Medication Administration  Injection # 1:    Medication: Vit B12 1000 mcg    Diagnosis: BARIATRIC SURGERY STATUS (ICD-V45.86)    Route: IM    Site: L deltoid    Exp Date: 02/28/2012    Lot #: [id number]    Mfr: [employer]    Patient tolerated injection without complications    Given by: Linde Gillis CMA (AAMA) (October 08, 2010 10:06 AM)  Orders Added: 1)  Vit B12 1000 mcg [J3420] 2)  Admin of Therapeutic Inj  intramuscular or subcutaneous [45409]

## 2010-10-19 ENCOUNTER — Ambulatory Visit: Payer: Self-pay | Admitting: Family Medicine

## 2010-10-20 ENCOUNTER — Ambulatory Visit (INDEPENDENT_AMBULATORY_CARE_PROVIDER_SITE_OTHER): Payer: MEDICARE | Admitting: Family Medicine

## 2010-10-20 ENCOUNTER — Encounter: Payer: Self-pay | Admitting: Family Medicine

## 2010-10-20 ENCOUNTER — Other Ambulatory Visit: Payer: Self-pay | Admitting: Family Medicine

## 2010-10-20 DIAGNOSIS — E785 Hyperlipidemia, unspecified: Secondary | ICD-10-CM

## 2010-10-20 DIAGNOSIS — E538 Deficiency of other specified B group vitamins: Secondary | ICD-10-CM

## 2010-10-23 ENCOUNTER — Encounter: Payer: Self-pay | Admitting: Family Medicine

## 2010-10-27 NOTE — Miscellaneous (Signed)
  Clinical Lists Changes  Medications: Changed medication from CYANOCOBALAMIN 1000 MCG/ML SOLN (CYANOCOBALAMIN) One ml given IM weekly for 4 weeks to CYANOCOBALAMIN 1000 MCG/ML SOLN (CYANOCOBALAMIN) One ml given IM monthly

## 2010-10-27 NOTE — Assessment & Plan Note (Signed)
Summary: 6 wk f/u B12 and discuss cholesterol   Vital Signs:  Patient profile:   70 year old female Weight:      185.25 pounds BMI:     33.46 Temp:     98.2 degrees F oral Pulse rate:   60 / minute Pulse rhythm:   regular BP sitting:   110 / 60  (left arm) Cuff size:   large  Vitals Entered By: Sydell Axon LPN (October 20, 2010 8:50 AM) CC: 6 week follow-up on B-12 and cholesterol   History of Present Illness: here to f/u chol and B12   B12 was slt low at 214 started 4 shots in 4 weeks cannot tell much difference yet  did not have any parethesias or cramps some fatigue - ? multifactorial   is very nervous and stressed - working on her mother's estate  that will be done soon  is also still grieving     lipids very high in [place] with trig 112 and HDL 50 and LDL 211  (was 238) in past lipitor gave her leg pain  zocor -- ? why she stopped it - willing to try again    diet --is good , not eating greasy foods   wt up 7 lb with bmi of 33  AIC under 6     Allergies: 1)  ! Lipitor  Past History:  Past Medical History: Last updated: 07/13/2007 Coronary artery disease Depression Hyperlipidemia  Past Surgical History: Last updated: 2010/09/16 Cholecystectomy Tubal ligation Tonsillectomy Exercise treadmill 16109) Cath- mild to mod single vessel disease (12/2001) C-S surgery (10/2003) Carotid dopplers- neg (05/2005) lap band for wt loss 2011  Family History: Last updated: 09-16-10 Father: deceased age 60- MI Mother: pacemaker, ETOH, smoking, thyroid problems, breast cancer, HTN, high cholesterol (died of breast cancer)  Siblings:  PGM DM  Social History: Last updated: 10/10/2007 Marital Status: Married Children: 3- 1 son, suicide Occupation: retired Never Smoked takes art classed- oil painting  Risk Factors: Smoking Status: never (10/10/2007)  Review of Systems General:  Denies fatigue, fever, loss of appetite, and malaise. Eyes:  Denies  blurring and eye irritation. CV:  Denies chest pain or discomfort, lightheadness, and palpitations. Resp:  Denies cough, shortness of breath, and wheezing. GI:  Denies abdominal pain, change in bowel habits, indigestion, and nausea. MS:  Denies joint pain, joint redness, joint swelling, and cramps. Derm:  Denies itching and rash. Neuro:  Denies numbness and tingling. Psych:  Complains of anxiety; stressed and tired but overall doing ok . Endo:  Denies cold intolerance, excessive thirst, excessive urination, and heat intolerance. Heme:  Denies abnormal bruising and bleeding.  Physical Exam  General:  overweight but generally well appearing  Head:  normocephalic, atraumatic, and no abnormalities observed.   Eyes:  vision grossly intact, pupils equal, pupils round, and pupils reactive to light.  no conjunctival pallor, injection or icterus  Ears:  R ear normal and L ear normal.   Nose:  no nasal discharge.   Mouth:  pharynx pink and moist.   Neck:  supple with full rom and no masses or thyromegally, no JVD or carotid bruit  Chest Wall:  No deformities, masses, or tenderness noted. Lungs:  Normal respiratory effort, chest expands symmetrically. Lungs are clear to auscultation, no crackles or wheezes. Heart:  Normal rate and regular rhythm. S1 and S2 normal without gallop, murmur, click, rub or other extra sounds. Abdomen:  soft, non-tender, normal bowel sounds, no distention, and no masses.  no  renal bruits  Msk:  No deformity or scoliosis noted of thoracic or lumbar spine.   no acute joint changes  Pulses:  R and L carotid,radial,femoral,dorsalis pedis and posterior tibial pulses are full and equal bilaterally Extremities:  No clubbing, cyanosis, edema, or deformity noted with normal full range of motion of all joints.   Neurologic:  sensation intact to light touch and gait normal no tremor .   Skin:  Intact without suspicious lesions or rashes Cervical Nodes:  No lymphadenopathy  noted Inguinal Nodes:  No significant adenopathy Psych:  normal affect, talkative and pleasant --despite general fatigue and talk of stress    Impression & Recommendations:  Problem # 1:  VITAMIN B12 DEFICIENCY (ICD-266.2) Assessment New in pt with prev bariatric surgery suspect she will continue to need injections has had 4 in 4 weeks check level today then make dosing plan Orders: TLB-B12, Serum-Total ONLY (16109-U04) Venipuncture (54098)  Problem # 2:  HYPERLIPIDEMIA (ICD-272.4) Assessment: Unchanged  very high  pt not on statin  good diet- this is hereditary  is agreeable to anything but lipitor - will call ins to check coverage  ? if zocor 40 will be enough  would like to try crestor if affordible- will see rev low sat fat diet  The following medications were removed from the medication list:    Zocor 40 Mg Tabs (Simvastatin) .Marland Kitchen... 1 by mouth once daily  Labs Reviewed: SGOT: 20 (09/08/2010)   SGPT: 12 (09/08/2010)   HDL:50.90 (09/08/2010), 45.60 (07/15/2009)  LDL:111 (10/23/2007), DEL (09/04/2007)  Chol:273 (09/08/2010), 293 (07/15/2009)  Trig:112.0 (09/08/2010), 141.0 (07/15/2009)  Complete Medication List: 1)  Cyanocobalamin 1000 Mcg/ml Soln (Cyanocobalamin) .... One ml given im weekly for 4 weeks  Patient Instructions: 1)  checking vitamin B12 today so I can make a plan about how often to do shots  2)  please call your insurance and get a list of cholesterol medicines that are affordible to you -- then call or send me the list so I can choose    Orders Added: 1)  TLB-B12, Serum-Total ONLY [82607-B12] 2)  Venipuncture [11914] 3)  Est. Patient Level IV [78295]    Current Allergies (reviewed today): ! LIPITOR

## 2010-11-05 ENCOUNTER — Telehealth: Payer: Self-pay | Admitting: Family Medicine

## 2010-11-05 ENCOUNTER — Encounter: Payer: Self-pay | Admitting: Family Medicine

## 2010-11-05 ENCOUNTER — Ambulatory Visit (INDEPENDENT_AMBULATORY_CARE_PROVIDER_SITE_OTHER): Payer: MEDICARE

## 2010-11-05 DIAGNOSIS — E538 Deficiency of other specified B group vitamins: Secondary | ICD-10-CM

## 2010-11-06 ENCOUNTER — Telehealth: Payer: Self-pay | Admitting: Family Medicine

## 2010-11-10 NOTE — Assessment & Plan Note (Signed)
Summary: B-12 injection Salley Scarlet  Nurse Visit   Allergies: 1)  ! Lipitor  Medication Administration  Injection # 1:    Medication: Vit B12 1000 mcg    Diagnosis: VITAMIN B12 DEFICIENCY (ICD-266.2)    Route: IM    Site: R deltoid    Exp Date: 05/30/2012    Lot #: [id number]    Mfr: [employer]ent    Given by: Melody Comas (November 05, 2010 10:45 AM)  Orders Added: 1)  Vit B12 1000 mcg [J3420] 2)  Admin of Therapeutic Inj  intramuscular or subcutaneous [16109]

## 2010-11-10 NOTE — Progress Notes (Signed)
Summary: all statins are covered by insurance  Phone Note Call from Patient   Caller: Patient Call For: Judith Part MD Summary of Call: Pt called to report that her insurance covers any of the statins. She uses [facility] if something is called in.            Lowella Petties CMA, AAMA  November 05, 2010 12:19 PM   Follow-up for Phone Call        lets try crestor 10 mg if any side effects stop it and let me know  px written on EMR for call in  sched fasting lab 6 weeks please Follow-up by: Judith Part MD,  November 06, 2010 6:33 AM  Additional Follow-up for Phone Call Additional follow up Details #1::        Patient notified as instructed by telephone. Med sent electronically to [facility] as instructed. Pt will schedule fasting lab appt when she has nurse visit in 11/2010.Lewanda Rife LPN  November 05, 1608 9:57 AM     New/Updated Medications: CRESTOR 10 MG TABS (ROSUVASTATIN CALCIUM) 1 by mouth once daily Prescriptions: CRESTOR 10 MG TABS (ROSUVASTATIN CALCIUM) 1 by mouth once daily  #30 x 11   Entered by:   Lewanda Rife LPN   Authorized by:   Judith Part MD   Signed by:   Lewanda Rife LPN on 96/11/5407   Method used:   Electronically to        [facility]* (retail)       [address] [place], [place]  [address]       Ph: [phone] or [phone]       Fax: [phone]   RxID:   [id number]

## 2010-11-16 LAB — DIFFERENTIAL
Basophils Relative: 1 % (ref 0–1)
Eosinophils Absolute: 0.1 10*3/uL (ref 0.0–0.7)
Eosinophils Relative: 1 % (ref 0–5)
Monocytes Relative: 8 % (ref 3–12)
Neutrophils Relative %: 68 % (ref 43–77)

## 2010-11-16 LAB — CBC
HCT: 36.8 % (ref 36.0–46.0)
MCHC: 33.5 g/dL (ref 30.0–36.0)
MCV: 91.4 fL (ref 78.0–100.0)
RBC: 4.03 MIL/uL (ref 3.87–5.11)

## 2010-11-17 LAB — CBC
MCHC: 33.4 g/dL (ref 30.0–36.0)
MCV: 91.3 fL (ref 78.0–100.0)
Platelets: 232 10*3/uL (ref 150–400)
RBC: 4.76 MIL/uL (ref 3.87–5.11)
RDW: 14.1 % (ref 11.5–15.5)

## 2010-11-17 LAB — COMPREHENSIVE METABOLIC PANEL
ALT: 26 U/L (ref 0–35)
AST: 34 U/L (ref 0–37)
Albumin: 4.1 g/dL (ref 3.5–5.2)
CO2: 27 mEq/L (ref 19–32)
Calcium: 9.4 mg/dL (ref 8.4–10.5)
Creatinine, Ser: 0.79 mg/dL (ref 0.4–1.2)
GFR calc Af Amer: >60 mL/min (ref >60)
Sodium: 140 mEq/L (ref 135–145)
Total Protein: 7.4 g/dL (ref 6.0–8.3)

## 2010-11-17 LAB — DIFFERENTIAL
Eosinophils Absolute: 0.1 10*3/uL (ref 0.0–0.7)
Eosinophils Relative: 1 % (ref 0–5)
Lymphocytes Relative: 27 % (ref 12–46)
Lymphs Abs: 1.9 10*3/uL (ref 0.7–4.0)
Monocytes Absolute: 0.6 10*3/uL (ref 0.1–1.0)
Monocytes Relative: 9 % (ref 3–12)

## 2010-11-17 NOTE — Progress Notes (Signed)
Summary: crestor is too costly  Phone Note Call from Patient   Caller: Patient Summary of Call: Pt states crestor is too expensive, she wants to try a statin.  Uses [facility].               Lowella Petties CMA, AAMA  November 06, 2010 2:30 PM   Follow-up for Phone Call        for the record crestor is a statin and she just called and said that "all statins" are covered by her ins - so I am confused  I may need clarification of what statins are covered particularly thanks Follow-up by: Judith Part MD,  November 06, 2010 2:44 PM  Additional Follow-up for Phone Call Additional follow up Details #1::        Patient notified as instructed by telephone. Pt to contact insurance co for specific names of statins that are cov ered under a Tier acceptable to pt. Pt will call back with information.Lewanda Rife LPN  November 05, 1608 4:07 PM   Pt wants a generic statin.  She said simvastatin, pravastatin, etc. Additional Follow-up by: Lowella Petties CMA, AAMA,  November 06, 2010 4:42 PM    Additional Follow-up for Phone Call Additional follow up Details #2::    lets try pravastatin --px written on EMR for call in update if side eff fasting labs please in 6 weeks lipid/ast/alt 272 -thanks  Follow-up by: Judith Part MD,  November 06, 2010 4:43 PM  Additional Follow-up for Phone Call Additional follow up Details #3:: Details for Additional Follow-up Action Taken: Med sent electronically to [facility] pharmacy as instructed. Patient notified as instructed by telephone. Pt said she will make lab appt when she gets her B12 injection in a few weeks.Lewanda Rife LPN  November 09, 2010 11:16 AM   New/Updated Medications: PRAVACHOL 40 MG TABS (PRAVASTATIN SODIUM) 1 by mouth once daily Prescriptions: PRAVACHOL 40 MG TABS (PRAVASTATIN SODIUM) 1 by mouth once daily  #30 x 11   Entered by:   Lewanda Rife LPN   Authorized by:   Judith Part MD   Signed by:   Lewanda Rife LPN on 96/11/5407   Method  used:   Electronically to        [facility]* (retail)       [address] [place], [place]  [address]       Ph: [phone] or [phone]       Fax: [phone]   RxID:   [id number]

## 2010-12-08 ENCOUNTER — Ambulatory Visit: Payer: MEDICARE

## 2010-12-10 ENCOUNTER — Ambulatory Visit: Payer: MEDICARE

## 2010-12-16 ENCOUNTER — Other Ambulatory Visit: Payer: Self-pay | Admitting: *Deleted

## 2010-12-16 DIAGNOSIS — E7800 Pure hypercholesterolemia, unspecified: Secondary | ICD-10-CM

## 2010-12-24 ENCOUNTER — Ambulatory Visit (INDEPENDENT_AMBULATORY_CARE_PROVIDER_SITE_OTHER): Payer: Medicare Other | Admitting: Family Medicine

## 2010-12-24 ENCOUNTER — Other Ambulatory Visit (INDEPENDENT_AMBULATORY_CARE_PROVIDER_SITE_OTHER): Payer: Medicare Other

## 2010-12-24 DIAGNOSIS — E7800 Pure hypercholesterolemia, unspecified: Secondary | ICD-10-CM

## 2010-12-24 DIAGNOSIS — E538 Deficiency of other specified B group vitamins: Secondary | ICD-10-CM

## 2010-12-24 LAB — LIPID PANEL
Cholesterol: 216 mg/dL — ABNORMAL HIGH (ref 0–200)
Total CHOL/HDL Ratio: 4
VLDL: 20.8 mg/dL (ref 0.0–40.0)

## 2010-12-24 LAB — LDL CHOLESTEROL, DIRECT: Direct LDL: 145 mg/dL

## 2010-12-24 LAB — ALT: ALT: 11 U/L (ref 0–35)

## 2010-12-24 MED ORDER — CYANOCOBALAMIN 1000 MCG/ML IJ SOLN
1000.0000 ug | Freq: Once | INTRAMUSCULAR | Status: AC
  Administered 2010-12-24: 1000 ug via INTRAMUSCULAR

## 2010-12-24 NOTE — Progress Notes (Signed)
  Subjective:    Patient ID: Jessica Livingston, female    DOB: [birth year], 70 y.o.   MRN: [id number]  HPI Here for B12 shot    Review of Systems     Objective:   Physical Exam        Assessment & Plan:

## 2011-02-03 ENCOUNTER — Ambulatory Visit (INDEPENDENT_AMBULATORY_CARE_PROVIDER_SITE_OTHER): Payer: Medicare Other | Admitting: Family Medicine

## 2011-02-03 DIAGNOSIS — E538 Deficiency of other specified B group vitamins: Secondary | ICD-10-CM

## 2011-02-03 MED ORDER — CYANOCOBALAMIN 1000 MCG/ML IJ SOLN
1000.0000 ug | Freq: Once | INTRAMUSCULAR | Status: AC
  Administered 2011-02-03: 1000 ug via INTRAMUSCULAR

## 2011-02-03 NOTE — Progress Notes (Signed)
B12 injection given during nurse visit today. 

## 2011-07-06 ENCOUNTER — Telehealth (INDEPENDENT_AMBULATORY_CARE_PROVIDER_SITE_OTHER): Payer: Self-pay | Admitting: General Surgery

## 2011-07-06 NOTE — Telephone Encounter (Signed)
07/06/11 mailed recall notice to patient for bariatric surgery follow-up. Adv pt to call [facility] to schedule an appt. cef °

## 2011-07-30 ENCOUNTER — Encounter (INDEPENDENT_AMBULATORY_CARE_PROVIDER_SITE_OTHER): Payer: Self-pay

## 2011-07-30 ENCOUNTER — Ambulatory Visit (INDEPENDENT_AMBULATORY_CARE_PROVIDER_SITE_OTHER): Payer: Medicare Other | Admitting: Physician Assistant

## 2011-07-30 VITALS — BP 120/70 | Ht 63.0 in | Wt 187.6 lb

## 2011-07-30 DIAGNOSIS — Z4651 Encounter for fitting and adjustment of gastric lap band: Secondary | ICD-10-CM

## 2011-07-30 DIAGNOSIS — E669 Obesity, unspecified: Secondary | ICD-10-CM

## 2011-07-30 NOTE — Progress Notes (Signed)
  HISTORY: Jessica Livingston is a 70 y.o.female who received an AP-Standard lap-band in May 2011 by Dr. Johna Sheriff. She is complaining of persistent intolerance of most solids.  VITAL SIGNS: Filed Vitals:   07/30/11 1441  BP: 120/70    PHYSICAL EXAM: Physical exam reveals a very well-appearing 70 y.o.female in no apparent distress Neurologic: Awake, alert, oriented Psych: Bright affect, conversant Respiratory: Breathing even and unlabored. No stridor or wheezing Abdomen: Soft, nontender, nondistended to palpation. Incisions well-healed. No incisional hernias. Port easily palpated. Extremities: Atraumatic, good range of motion.  ASSESMENT: 70 y.o.  female  s/p AP-Standard lap-band.   PLAN: The patient's port was accessed with a 20G Huber needle without difficulty. Clear fluid was aspirated and 0.5 mL saline was removed from the port to give a total predicted volume of 4.5 mL. I recommended returning to her post-op diet to get back on track and to return to see [place] if she has persistent intolerance of solids.

## 2011-07-30 NOTE — Patient Instructions (Signed)
Return in 3 months or sooner as needed.

## 2011-11-04 ENCOUNTER — Encounter (INDEPENDENT_AMBULATORY_CARE_PROVIDER_SITE_OTHER): Payer: Self-pay

## 2011-11-04 ENCOUNTER — Ambulatory Visit (INDEPENDENT_AMBULATORY_CARE_PROVIDER_SITE_OTHER): Payer: Medicare Other | Admitting: Physician Assistant

## 2011-11-04 VITALS — BP 146/80 | HR 62 | Temp 98.1°F | Resp 16 | Ht 63.0 in | Wt 199.6 lb

## 2011-11-04 DIAGNOSIS — Z4651 Encounter for fitting and adjustment of gastric lap band: Secondary | ICD-10-CM

## 2011-11-04 NOTE — Progress Notes (Signed)
  HISTORY: Jessica Livingston is a 71 y.o.female who received an AP-Standard lap-band in May 2011 by Dr. Johna Sheriff. She comes in having gained 12 lbs since early December after having fluid removed for over-restriction. She denies vomiting or reflux. She would like a fill today to stay on track.  VITAL SIGNS: Filed Vitals:   11/04/11 1034  BP: 146/80  Pulse: 62  Temp: 98.1 F (36.7 C)  Resp: 16    PHYSICAL EXAM: Physical exam reveals a very well-appearing 71 y.o.female in no apparent distress Neurologic: Awake, alert, oriented Psych: Bright affect, conversant Respiratory: Breathing even and unlabored. No stridor or wheezing Abdomen: Soft, nontender, nondistended to palpation. Incisions well-healed. No incisional hernias. Port easily palpated. Extremities: Atraumatic, good range of motion.  ASSESMENT: 71 y.o.  female  s/p AP-Standard lap-band.   PLAN: The patient's port was accessed with a 20G Huber needle without difficulty. Clear fluid was aspirated and 0.25 mL saline was added to the port to give a total predicted volume of 4.75 mL. The patient was able to swallow water without difficulty following the procedure and was instructed to take clear liquids for the next 24-48 hours and advance slowly as tolerated.

## 2011-11-04 NOTE — Patient Instructions (Signed)
Take clear liquids tonight. Thin protein shakes are ok to start tomorrow morning. Slowly advance your diet thereafter. Call us if you have persistent vomiting or regurgitation, night cough or reflux symptoms. Return as scheduled or sooner if you notice no changes in hunger/portion sizes.  

## 2011-11-19 LAB — HM MAMMOGRAPHY: HM Mammogram: NORMAL

## 2011-11-22 ENCOUNTER — Encounter: Payer: Self-pay | Admitting: Family Medicine

## 2011-11-25 ENCOUNTER — Encounter: Payer: Self-pay | Admitting: Family Medicine

## 2011-11-25 ENCOUNTER — Encounter: Payer: Self-pay | Admitting: *Deleted

## 2012-01-27 ENCOUNTER — Encounter (INDEPENDENT_AMBULATORY_CARE_PROVIDER_SITE_OTHER): Payer: Medicare Other

## 2012-02-07 ENCOUNTER — Ambulatory Visit (INDEPENDENT_AMBULATORY_CARE_PROVIDER_SITE_OTHER): Payer: Medicare Other | Admitting: Family Medicine

## 2012-02-07 ENCOUNTER — Encounter: Payer: Self-pay | Admitting: Family Medicine

## 2012-02-07 VITALS — BP 116/72 | HR 60 | Temp 97.9°F | Ht 62.5 in | Wt 201.2 lb

## 2012-02-07 DIAGNOSIS — M545 Low back pain, unspecified: Secondary | ICD-10-CM

## 2012-02-07 MED ORDER — CYCLOBENZAPRINE HCL 10 MG PO TABS: 10.0000 mg | ORAL_TABLET | Freq: Every evening | ORAL | Status: DC | PRN

## 2012-02-07 NOTE — Progress Notes (Signed)
Subjective:    Patient ID: Jessica Livingston, female    DOB: [birth year], 71 y.o.   MRN: [id number]  HPI Here for a fall 3 weeks ago  Tumbled down a hill and hit R knee- felt pressure through to the back  Just above buttock on R side  Not sharp or throbbing  Worse when turning over in bed  Is dull and constant otherwise  advil does help  Heat feels very good   Hurts to bend forward  Patient Active Problem List  Diagnoses  . HYPERLIPIDEMIA  . DEPRESSION  . CORONARY ARTERY DISEASE  . HYPERGLYCEMIA  . OBESITY  . BARIATRIC SURGERY STATUS  . VITAMIN B12 DEFICIENCY  . SEBORRHEIC KERATOSIS   Past Medical History  Diagnosis Date  . CAD (coronary artery disease)   . Depression   . HLD (hyperlipidemia)   . Other abnormal glucose   . Obesity, unspecified   . Other seborrheic keratosis   . Other B-complex deficiencies    Past Surgical History  Procedure Date  . Cholecystectomy   . Laparoscopic gastric banding 01/13/2010  . Tubal ligation   . Tonsillectomy   . Cardiac catheterization 5/03    mild to mod single vessel disease   History  Substance Use Topics  . Smoking status: Never Smoker   . Smokeless tobacco: Never Used  . Alcohol Use: No   Family History  Problem Relation Age of Onset  . Breast cancer Mother   . Alcohol abuse Mother   . Thyroid disease Mother   . Hypertension Mother   . Hyperlipidemia Mother   . Heart attack Father   . Diabetes Paternal Grandmother    Allergies  Allergen Reactions  . Atorvastatin     REACTION: leg pain   Current Outpatient Prescriptions on File Prior to Visit  Medication Sig Dispense Refill  . pravastatin (PRAVACHOL) 40 MG tablet Take 40 mg by mouth daily.          Review of Systems Review of Systems  Constitutional: Negative for fever, appetite change, fatigue and unexpected weight change.  Eyes: Negative for pain and visual disturbance.  Respiratory: Negative for cough and shortness of breath.   Cardiovascular: Negative  for cp or palpitations    Gastrointestinal: Negative for nausea, diarrhea and constipation.  Genitourinary: Negative for urgency and frequency.  Skin: Negative for pallor or rash   MSK pos for dull pain in low back/ neg for acute joint swelling or pain  Neurological: Negative for weakness, light-headedness, numbness and headaches.  Hematological: Negative for adenopathy. Does not bruise/bleed easily.  Psychiatric/Behavioral: Negative for dysphoric mood. The patient is not nervous/anxious.         Objective:   Physical Exam  Constitutional: She appears well-developed and well-nourished. No distress.       overwt and well appearing   HENT:  Head: Normocephalic and atraumatic.  Eyes: Conjunctivae and EOM are normal. Pupils are equal, round, and reactive to light.  Neck: Normal range of motion. Neck supple.  Cardiovascular: Normal rate and regular rhythm.   Pulmonary/Chest: Breath sounds normal.  Musculoskeletal: She exhibits tenderness. She exhibits no edema.       Lumbar back: She exhibits decreased range of motion, tenderness and spasm. She exhibits no bony tenderness, no swelling and no edema.       Tender in R peri lumbar musculature  Spasm noted  Neg SLR Nl rom hip Flex 90 deg with pain, nl ext of LS  Nl gait  No  acute joint changes   Neurological: She is alert. She has normal reflexes. She displays no atrophy. No cranial nerve deficit or sensory deficit. She exhibits normal muscle tone. Coordination normal.  Skin: Skin is warm and dry. No rash noted. No erythema. No pallor.  Psychiatric: She has a normal mood and affect.          Assessment & Plan:

## 2012-02-07 NOTE — Assessment & Plan Note (Signed)
R lumbar pain after a fall with re assuring exam Suspect spasm - stiffness after inactivity Disc stretches and use of heat Given px for flexeril to use at night  If not imp will consider PT

## 2012-02-07 NOTE — Patient Instructions (Addendum)
Keep using heat on your back 10 minutes at a time Try the flexeril at night  Update if not starting to improve in a week or if worsening   Could consider physical therapy if no improvement

## 2012-02-08 MED ORDER — CYCLOBENZAPRINE HCL 10 MG PO TABS: 10.0000 mg | ORAL_TABLET | Freq: Every evening | ORAL | Status: AC | PRN
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
## 2012-04-27 ENCOUNTER — Telehealth: Payer: Self-pay

## 2012-04-27 MED ORDER — ZOSTER VACCINE LIVE 19400 UNT/0.65ML ~~LOC~~ SOLR: 0.6500 mL | Freq: Once | SUBCUTANEOUS | Status: AC

## 2012-04-27 NOTE — Telephone Encounter (Signed)
Pt left v/m requesting shingles vaccine rx faxed to [facility]  Fax # [phone].Please advise.

## 2012-04-27 NOTE — Telephone Encounter (Signed)
I sent it electronically  

## 2012-04-28 NOTE — Telephone Encounter (Signed)
Patient notified

## 2012-11-21 ENCOUNTER — Encounter: Payer: Self-pay | Admitting: Family Medicine

## 2012-11-22 ENCOUNTER — Encounter: Payer: Self-pay | Admitting: *Deleted

## 2013-01-08 ENCOUNTER — Encounter: Payer: Self-pay | Admitting: Family Medicine

## 2013-01-08 ENCOUNTER — Ambulatory Visit: Payer: Medicare Other | Admitting: Family Medicine

## 2013-01-08 ENCOUNTER — Ambulatory Visit (INDEPENDENT_AMBULATORY_CARE_PROVIDER_SITE_OTHER): Payer: Medicare Other | Admitting: Family Medicine

## 2013-01-08 VITALS — BP 120/72 | HR 76 | Temp 98.6°F | Ht 62.5 in | Wt 207.5 lb

## 2013-01-08 DIAGNOSIS — S30860A Insect bite (nonvenomous) of lower back and pelvis, initial encounter: Secondary | ICD-10-CM

## 2013-01-08 DIAGNOSIS — W57XXXA Bitten or stung by nonvenomous insect and other nonvenomous arthropods, initial encounter: Secondary | ICD-10-CM | POA: Insufficient documentation

## 2013-01-08 NOTE — Progress Notes (Signed)
Subjective:    Patient ID: Jessica Livingston, female    DOB: 23-Jan-1941, 72 y.o.   MRN: [id number]  HPI Here with a tick bite   Had some itching on her back - about a week ago - discovered the tick yesterday- someone looked at her back Thinks they got the whole thing off  Size of a pin - not engorged (thinks it was a deer tick) Is generally not out in the woods a lot  No rash or fever   Patient Active Problem List   Diagnosis Date Noted  . Low back pain 02/07/2012  . VITAMIN B12 DEFICIENCY 10/20/2010  . BARIATRIC SURGERY STATUS 09/08/2010  . SEBORRHEIC KERATOSIS 09/08/2010  . OBESITY 07/15/2009  . HYPERGLYCEMIA 10/10/2007  . HYPERLIPIDEMIA 07/13/2007  . DEPRESSION 07/13/2007  . CORONARY ARTERY DISEASE 07/13/2007   Past Medical History  Diagnosis Date  . CAD (coronary artery disease)   . Depression   . HLD (hyperlipidemia)   . Other abnormal glucose   . Obesity, unspecified   . Other seborrheic keratosis   . Other B-complex deficiencies    Past Surgical History  Procedure Laterality Date  . Cholecystectomy    . Laparoscopic gastric banding  01/13/2010  . Tubal ligation    . Tonsillectomy    . Cardiac catheterization  5/03    mild to mod single vessel disease   History  Substance Use Topics  . Smoking status: Never Smoker   . Smokeless tobacco: Never Used  . Alcohol Use: No   Family History  Problem Relation Age of Onset  . Breast cancer Mother   . Alcohol abuse Mother   . Thyroid disease Mother   . Hypertension Mother   . Hyperlipidemia Mother   . Heart attack Father   . Diabetes Paternal Grandmother    Allergies  Allergen Reactions  . Atorvastatin     REACTION: leg pain   Current Outpatient Prescriptions on File Prior to Visit  Medication Sig Dispense Refill  . pravastatin (PRAVACHOL) 40 MG tablet Take 40 mg by mouth daily.       No current facility-administered medications on file prior to visit.      Review of Systems Review of Systems   Constitutional: Negative for fever, appetite change, fatigue and unexpected weight change.  Eyes: Negative for pain and visual disturbance.  Respiratory: Negative for cough and shortness of breath.   Cardiovascular: Negative for cp or palpitations    Gastrointestinal: Negative for nausea, diarrhea and constipation.  Genitourinary: Negative for urgency and frequency.  Skin: Negative for pallor or rash  pos for redness of tick bite site  Neurological: Negative for weakness, light-headedness, numbness and headaches.  Hematological: Negative for adenopathy. Does not bruise/bleed easily.  Psychiatric/Behavioral: Negative for dysphoric mood. The patient is not nervous/anxious.         Objective:   Physical Exam  Constitutional: She appears well-developed and well-nourished. No distress.  obese and well appearing   Eyes: Conjunctivae and EOM are normal. Pupils are equal, round, and reactive to light. Right eye exhibits no discharge. Left eye exhibits no discharge.  Neck: Normal range of motion. Neck supple.  Cardiovascular: Normal rate and regular rhythm.   Pulmonary/Chest: Effort normal and breath sounds normal.  Musculoskeletal: She exhibits no edema and no tenderness.  No acute joint changes   Lymphadenopathy:    She has no cervical adenopathy.  Neurological: She is alert.  Skin: Skin is warm and dry. No rash noted. There is erythema.  No pallor.  Tick bite mid back with small scab -no remaining tick parts No drainage  Erythema in approx 1 inch perimeter  Non tender No bullseye pattern   No rashes   Psychiatric: She has a normal mood and affect.          Assessment & Plan:

## 2013-01-08 NOTE — Assessment & Plan Note (Signed)
Tick bite from likely deer tick - with scab (no tick remains) and about 1 inch of redness surrounding (no bullseye)-no fever/ malaise/rash or other symptoms Disc infection prev/ tick bite prev Cleaned and dressed with band aid  Will call / or f/u if enl area of redness or bullseye shape/ rash/ fever or joint pain

## 2013-01-08 NOTE — Patient Instructions (Addendum)
For tick bite- keep it clean and dry  Antibiotic ointment over the counter is ok  If you develop a bullseye shaped lesion around the tick bite or much increased redness or swelling- let me know  If fever/ joint pain / rash or other symptoms -- let me know

## 2013-02-22 ENCOUNTER — Encounter: Payer: Self-pay | Admitting: Internal Medicine

## 2013-02-22 ENCOUNTER — Encounter (HOSPITAL_COMMUNITY): Payer: Self-pay | Admitting: Internal Medicine

## 2013-02-22 ENCOUNTER — Other Ambulatory Visit (HOSPITAL_COMMUNITY): Payer: Self-pay | Admitting: Internal Medicine

## 2013-02-22 DIAGNOSIS — I519 Heart disease, unspecified: Secondary | ICD-10-CM

## 2013-03-07 ENCOUNTER — Ambulatory Visit (HOSPITAL_COMMUNITY)
Admission: RE | Admit: 2013-03-07 | Discharge: 2013-03-07 | Disposition: A | Discharge status: Home / Self Care | Payer: Medicare Other | Source: Ambulatory Visit | Attending: Cardiology | Admitting: Cardiology

## 2013-03-07 DIAGNOSIS — I519 Heart disease, unspecified: Secondary | ICD-10-CM | POA: Insufficient documentation

## 2013-03-07 DIAGNOSIS — R42 Dizziness and giddiness: Secondary | ICD-10-CM

## 2015-04-04 ENCOUNTER — Encounter: Payer: Self-pay | Admitting: Gastroenterology

## 2016-01-01 DIAGNOSIS — E559 Vitamin D deficiency, unspecified: Secondary | ICD-10-CM | POA: Diagnosis not present

## 2016-01-01 DIAGNOSIS — E7800 Pure hypercholesterolemia, unspecified: Secondary | ICD-10-CM | POA: Diagnosis not present

## 2016-01-01 DIAGNOSIS — Z0000 Encounter for general adult medical examination without abnormal findings: Secondary | ICD-10-CM | POA: Diagnosis not present

## 2016-01-08 DIAGNOSIS — F329 Major depressive disorder, single episode, unspecified: Secondary | ICD-10-CM | POA: Diagnosis not present

## 2016-01-08 DIAGNOSIS — Z0000 Encounter for general adult medical examination without abnormal findings: Secondary | ICD-10-CM | POA: Diagnosis not present

## 2016-01-08 DIAGNOSIS — E7800 Pure hypercholesterolemia, unspecified: Secondary | ICD-10-CM | POA: Diagnosis not present

## 2016-01-08 DIAGNOSIS — E669 Obesity, unspecified: Secondary | ICD-10-CM | POA: Diagnosis not present

## 2016-01-27 DIAGNOSIS — Z1389 Encounter for screening for other disorder: Secondary | ICD-10-CM | POA: Diagnosis not present

## 2016-01-27 DIAGNOSIS — Z0000 Encounter for general adult medical examination without abnormal findings: Secondary | ICD-10-CM | POA: Diagnosis not present

## 2016-01-27 DIAGNOSIS — E559 Vitamin D deficiency, unspecified: Secondary | ICD-10-CM | POA: Diagnosis not present

## 2016-02-27 DIAGNOSIS — Z1231 Encounter for screening mammogram for malignant neoplasm of breast: Secondary | ICD-10-CM | POA: Diagnosis not present

## 2016-05-06 DIAGNOSIS — Z23 Encounter for immunization: Secondary | ICD-10-CM | POA: Diagnosis not present

## 2016-06-03 DIAGNOSIS — H52223 Regular astigmatism, bilateral: Secondary | ICD-10-CM | POA: Diagnosis not present

## 2016-06-03 DIAGNOSIS — H5203 Hypermetropia, bilateral: Secondary | ICD-10-CM | POA: Diagnosis not present

## 2016-06-03 DIAGNOSIS — H25013 Cortical age-related cataract, bilateral: Secondary | ICD-10-CM | POA: Diagnosis not present

## 2016-06-03 DIAGNOSIS — H2513 Age-related nuclear cataract, bilateral: Secondary | ICD-10-CM | POA: Diagnosis not present

## 2016-06-03 DIAGNOSIS — H353132 Nonexudative age-related macular degeneration, bilateral, intermediate dry stage: Secondary | ICD-10-CM | POA: Diagnosis not present

## 2016-06-29 DIAGNOSIS — H2513 Age-related nuclear cataract, bilateral: Secondary | ICD-10-CM | POA: Diagnosis not present

## 2016-06-29 DIAGNOSIS — H353131 Nonexudative age-related macular degeneration, bilateral, early dry stage: Secondary | ICD-10-CM | POA: Diagnosis not present

## 2016-06-29 DIAGNOSIS — H3562 Retinal hemorrhage, left eye: Secondary | ICD-10-CM | POA: Diagnosis not present

## 2016-07-01 DIAGNOSIS — H3562 Retinal hemorrhage, left eye: Secondary | ICD-10-CM | POA: Diagnosis not present

## 2016-07-08 DIAGNOSIS — E538 Deficiency of other specified B group vitamins: Secondary | ICD-10-CM | POA: Diagnosis not present

## 2016-07-08 DIAGNOSIS — E7800 Pure hypercholesterolemia, unspecified: Secondary | ICD-10-CM | POA: Diagnosis not present

## 2016-07-08 DIAGNOSIS — E559 Vitamin D deficiency, unspecified: Secondary | ICD-10-CM | POA: Diagnosis not present

## 2016-07-19 DIAGNOSIS — H2511 Age-related nuclear cataract, right eye: Secondary | ICD-10-CM | POA: Diagnosis not present

## 2016-07-29 DIAGNOSIS — H2511 Age-related nuclear cataract, right eye: Secondary | ICD-10-CM | POA: Diagnosis not present

## 2016-08-17 ENCOUNTER — Encounter (HOSPITAL_COMMUNITY): Payer: Self-pay

## 2016-08-19 DIAGNOSIS — H2512 Age-related nuclear cataract, left eye: Secondary | ICD-10-CM | POA: Diagnosis not present

## 2016-08-26 DIAGNOSIS — H2512 Age-related nuclear cataract, left eye: Secondary | ICD-10-CM | POA: Diagnosis not present

## 2016-08-30 DIAGNOSIS — H269 Unspecified cataract: Secondary | ICD-10-CM

## 2016-08-30 HISTORY — PX: POLYPECTOMY: SHX149

## 2016-08-30 HISTORY — PX: COLONOSCOPY: SHX174

## 2016-08-30 HISTORY — DX: Unspecified cataract: H26.9

## 2017-01-07 DIAGNOSIS — E559 Vitamin D deficiency, unspecified: Secondary | ICD-10-CM | POA: Diagnosis not present

## 2017-01-07 DIAGNOSIS — N390 Urinary tract infection, site not specified: Secondary | ICD-10-CM | POA: Diagnosis not present

## 2017-01-07 DIAGNOSIS — Z0000 Encounter for general adult medical examination without abnormal findings: Secondary | ICD-10-CM | POA: Diagnosis not present

## 2017-01-07 DIAGNOSIS — E7800 Pure hypercholesterolemia, unspecified: Secondary | ICD-10-CM | POA: Diagnosis not present

## 2017-01-12 DIAGNOSIS — E669 Obesity, unspecified: Secondary | ICD-10-CM | POA: Diagnosis not present

## 2017-01-12 DIAGNOSIS — Z0000 Encounter for general adult medical examination without abnormal findings: Secondary | ICD-10-CM | POA: Diagnosis not present

## 2017-01-12 DIAGNOSIS — E559 Vitamin D deficiency, unspecified: Secondary | ICD-10-CM | POA: Diagnosis not present

## 2017-01-12 DIAGNOSIS — Z1212 Encounter for screening for malignant neoplasm of rectum: Secondary | ICD-10-CM | POA: Diagnosis not present

## 2017-01-12 DIAGNOSIS — E7800 Pure hypercholesterolemia, unspecified: Secondary | ICD-10-CM | POA: Diagnosis not present

## 2017-02-02 DIAGNOSIS — Z1211 Encounter for screening for malignant neoplasm of colon: Secondary | ICD-10-CM | POA: Diagnosis not present

## 2017-02-02 DIAGNOSIS — Z1212 Encounter for screening for malignant neoplasm of rectum: Secondary | ICD-10-CM | POA: Diagnosis not present

## 2017-02-02 LAB — COLOGUARD

## 2017-02-11 ENCOUNTER — Encounter: Payer: Self-pay | Admitting: Gastroenterology

## 2017-02-14 ENCOUNTER — Encounter: Payer: Self-pay | Admitting: Emergency Medicine

## 2017-02-17 ENCOUNTER — Encounter (INDEPENDENT_AMBULATORY_CARE_PROVIDER_SITE_OTHER): Payer: Self-pay

## 2017-02-17 ENCOUNTER — Encounter: Payer: Self-pay | Admitting: Gastroenterology

## 2017-02-17 ENCOUNTER — Ambulatory Visit (INDEPENDENT_AMBULATORY_CARE_PROVIDER_SITE_OTHER): Payer: PPO | Admitting: Gastroenterology

## 2017-02-17 VITALS — BP 140/72 | HR 58 | Ht 62.6 in | Wt 210.0 lb

## 2017-02-17 DIAGNOSIS — R195 Other fecal abnormalities: Secondary | ICD-10-CM | POA: Diagnosis not present

## 2017-02-17 DIAGNOSIS — Z86010 Personal history of colonic polyps: Secondary | ICD-10-CM | POA: Insufficient documentation

## 2017-02-17 MED ORDER — NA SULFATE-K SULFATE-MG SULF 17.5-3.13-1.6 GM/177ML PO SOLN: 1.0000 | ORAL | 0 refills | Status: DC

## 2017-02-17 NOTE — Patient Instructions (Signed)

## 2017-02-17 NOTE — Progress Notes (Signed)
Agree with assessment and plan as outlined.  

## 2017-02-17 NOTE — Progress Notes (Signed)
02/17/2017 Jessica Livingston [id number] 1941-02-07   HISTORY OF PRESENT ILLNESS:  This is a 76 year old female who is previously known to Dr. Sharlett Iles.  Had a colonoscopy in 09/2009 at which time she was found to have some polyps that were removed, at least one of which was a tubular adenoma.  She had not returned for colonoscopy since that time.  She had a recent positive Cologuard.  Was referred here by her PCP, Dr. Shelia Media, for that reason.  Recent Hgb 13.8 grams.  Patient denies seeing any definite blood in her stools.  Says that her stools are mushy/soft but have been that way for quite some time and is not really a change for her.  Essentially does not really have any GI complaints.  Complains of fatigue, which she was told was likely from having a lot of stress with caring for her sick husband, etc.  Past Medical History:  Diagnosis Date  . Breast cancer ([place])   . CAD (coronary artery disease)   . Depression   . Heart disease '  . HLD (hyperlipidemia)   . Obesity, unspecified   . Other abnormal glucose   . Other B-complex deficiencies   . Other seborrheic keratosis   . Postmenopausal    Past Surgical History:  Procedure Laterality Date  . CARDIAC CATHETERIZATION  5/03   mild to mod single vessel disease  . CHOLECYSTECTOMY    . LAPAROSCOPIC GASTRIC BANDING  01/13/2010  . NECK SURGERY    . TONSILLECTOMY    . TUBAL LIGATION Bilateral     reports that she has never smoked. She has never used smokeless tobacco. She reports that she drinks alcohol. She reports that she does not use drugs. family history includes Alcohol abuse in her mother; Breast cancer in her mother; Diabetes in her paternal grandmother; Heart attack in her father; Heart disease in her father; Hyperlipidemia in her mother; Hypertension in her mother; Kidney disease in her mother; Lung cancer in her mother; Thyroid disease in her mother. Allergies  Allergen Reactions  . Atorvastatin     REACTION: leg pain        Outpatient Encounter Prescriptions as of 02/17/2017  Medication Sig  . VITAMIN D, CHOLECALCIFEROL, PO Take 2,000 mcg by mouth.  . [DISCONTINUED] pravastatin (PRAVACHOL) 40 MG tablet Take 40 mg by mouth daily.   No facility-administered encounter medications on file as of 02/17/2017.      REVIEW OF SYSTEMS  : All other systems reviewed and negative except where noted in the History of Present Illness.   PHYSICAL EXAM: BP 140/72   Pulse (!) 58   Ht 5' 2.6" (1.59 m)   Wt 210 lb (95.3 kg)   BMI 37.68 kg/m  General: Well developed white female in no acute distress Head: Normocephalic and atraumatic Eyes:  Sclerae anicteric, conjunctiva pink. Ears: Normal auditory acuity Lungs: Clear throughout to auscultation; no increased WOB. Heart: Regular rate and rhythm Abdomen: Soft, non-distended. Normal bowel sounds.  Non-tender. Rectal:  Will be done at the time of colonoscopy. Musculoskeletal: Symmetrical with no gross deformities  Skin: No lesions on visible extremities Extremities: No edema  Neurological: Alert oriented x 4, grossly non-focal Psychological:  Alert and cooperative. Normal mood and affect  ASSESSMENT AND PLAN: -Personal history of colon polyps:  Tubular adenoma in 2011. -Abnormal contents in stool:  Positive Cologuard.    *Will schedule patient for colonoscopy with Dr. Havery Moros.  The risks, benefits, and alternatives to colonoscopy  were discussed with the patient and she consents to proceed.   CC:  Deland Pretty, MD

## 2017-02-25 ENCOUNTER — Ambulatory Visit (AMBULATORY_SURGERY_CENTER): Payer: PPO | Admitting: Gastroenterology

## 2017-02-25 ENCOUNTER — Encounter: Payer: Self-pay | Admitting: Gastroenterology

## 2017-02-25 VITALS — BP 128/68 | HR 57 | Temp 98.0°F | Resp 10 | Ht 62.0 in | Wt 210.0 lb

## 2017-02-25 DIAGNOSIS — D120 Benign neoplasm of cecum: Secondary | ICD-10-CM

## 2017-02-25 DIAGNOSIS — D123 Benign neoplasm of transverse colon: Secondary | ICD-10-CM

## 2017-02-25 DIAGNOSIS — R195 Other fecal abnormalities: Secondary | ICD-10-CM

## 2017-02-25 DIAGNOSIS — Z86010 Personal history of colonic polyps: Secondary | ICD-10-CM | POA: Diagnosis not present

## 2017-02-25 DIAGNOSIS — D122 Benign neoplasm of ascending colon: Secondary | ICD-10-CM | POA: Diagnosis not present

## 2017-02-25 DIAGNOSIS — D124 Benign neoplasm of descending colon: Secondary | ICD-10-CM | POA: Diagnosis not present

## 2017-02-25 MED ORDER — SODIUM CHLORIDE 0.9 % IV SOLN: 500.0000 mL | INTRAVENOUS | Status: DC

## 2017-02-25 NOTE — Op Note (Signed)
[facility] Patient Name: Jessica Livingston Procedure Date: 02/25/2017 2:31 PM MRN: [id number] Endoscopist: Remo Lipps P. Jerzy Crotteau MD, MD Age: 76 Referring MD:  Date of Birth: 1940/10/27 Gender: Female Account #: [id number] Procedure:                Colonoscopy Indications:              Positive Cologuard test Medicines:                Monitored Anesthesia Care Procedure:                Pre-Anesthesia Assessment:                           - Prior to the procedure, a History and Physical                            was performed, and patient medications and                            allergies were reviewed. The patient's tolerance of                            previous anesthesia was also reviewed. The risks                            and benefits of the procedure and the sedation                            options and risks were discussed with the patient.                            All questions were answered, and informed consent                            was obtained. Prior Anticoagulants: The patient has                            taken no previous anticoagulant or antiplatelet                            agents. ASA Grade Assessment: III - A patient with                            severe systemic disease. After reviewing the risks                            and benefits, the patient was deemed in                            satisfactory condition to undergo the procedure.                           After obtaining informed consent, the colonoscope  was passed under direct vision. Throughout the                            procedure, the patient's blood pressure, pulse, and                            oxygen saturations were monitored continuously. The                            Colonoscope was introduced through the anus and                            advanced to the the cecum, identified by                            appendiceal orifice and ileocecal  valve. The                            colonoscopy was performed without difficulty. The                            patient tolerated the procedure well. The quality                            of the bowel preparation was adequate. The                            ileocecal valve, appendiceal orifice, and rectum                            were photographed. Scope In: 2:36:36 PM Scope Out: 2:59:34 PM Total Procedure Duration: 0 hours 22 minutes 58 seconds  Findings:                 The perianal and digital rectal examinations were                            normal.                           A diminutive polyp was found in the cecum. The                            polyp was sessile. The polyp was removed with a                            cold biopsy forceps. Resection and retrieval were                            complete.                           Two sessile polyps were found in the cecum. The  polyps were 4 to 7 mm in size. These polyps were                            removed with a cold snare. Resection and retrieval                            were complete.                           Three sessile polyps were found in the ascending                            colon. The polyps were 4 to 5 mm in size. These                            polyps were removed with a cold snare. Resection                            and retrieval were complete.                           Two sessile polyps were found in the transverse                            colon. The polyps were 4 to 7 mm in size. These                            polyps were removed with a cold snare. Resection                            and retrieval were complete.                           A 4 mm polyp was found in the descending colon. The                            polyp was sessile. The polyp was removed with a                            cold snare. Resection and retrieval were complete.                            Scattered medium-mouthed diverticula were found in                            the transverse colon and left colon.                           The exam was otherwise without abnormality on                            direct and retroflexion views. Complications:  No immediate complications. Estimated blood loss:                            Minimal. Estimated Blood Loss:     Estimated blood loss was minimal. Impression:               - One diminutive polyp in the cecum, removed with a                            cold biopsy forceps. Resected and retrieved.                           - Two 4 to 7 mm polyps in the cecum, removed with a                            cold snare. Resected and retrieved.                           - Three 4 to 5 mm polyps in the ascending colon,                            removed with a cold snare. Resected and retrieved.                           - Two 4 to 7 mm polyps in the transverse colon,                            removed with a cold snare. Resected and retrieved.                           - One 4 mm polyp in the descending colon, removed                            with a cold snare. Resected and retrieved.                           - Diverticulosis in the transverse colon and in the                            left colon.                           - The examination was otherwise normal on direct                            and retroflexion views. Recommendation:           - Patient has a contact number available for                            emergencies. The signs and symptoms of potential                            delayed complications were  discussed with the                            patient. Return to normal activities tomorrow.                            Written discharge instructions were provided to the                            patient.                           - Resume previous diet.                           - Continue present medications.                            - Await pathology results.                           - Repeat colonoscopy is recommended for                            surveillance. The colonoscopy date will be                            determined after pathology results from today's                            exam become available for review.                           - No ibuprofen, naproxen, or other non-steroidal                            anti-inflammatory drugs for 2 weeks after polyp                            removal. Remo Lipps P. Eugean Arnott MD, MD 02/25/2017 3:05:53 PM This report has been signed electronically.

## 2017-02-25 NOTE — Progress Notes (Signed)
Called to room to assist during endoscopic procedure.  Patient ID and intended procedure confirmed with present staff. Received instructions for my participation in the procedure from the performing physician.  

## 2017-02-25 NOTE — Patient Instructions (Signed)
Handout given on polyps  YOU HAD AN ENDOSCOPIC PROCEDURE TODAY: Refer to the procedure report and other information in the discharge instructions given to you for any specific questions about what was found during the examination. If this information does not answer your questions, please call [place] office at [phone] to clarify.   YOU SHOULD EXPECT: Some feelings of bloating in the abdomen. Passage of more gas than usual. Walking can help get rid of the air that was put into your GI tract during the procedure and reduce the bloating. If you had a lower endoscopy (such as a colonoscopy or flexible sigmoidoscopy) you may notice spotting of blood in your stool or on the toilet paper. Some abdominal soreness may be present for a day or two, also.  DIET: Your first meal following the procedure should be a light meal and then it is ok to progress to your normal diet. A half-sandwich or bowl of soup is an example of a good first meal. Heavy or fried foods are harder to digest and may make you feel nauseous or bloated. Drink plenty of fluids but you should avoid alcoholic beverages for 24 hours. If you had a esophageal dilation, please see attached instructions for diet.    ACTIVITY: Your care partner should take you home directly after the procedure. You should plan to take it easy, moving slowly for the rest of the day. You can resume normal activity the day after the procedure however YOU SHOULD NOT DRIVE, use power tools, machinery or perform tasks that involve climbing or major physical exertion for 24 hours (because of the sedation medicines used during the test).   SYMPTOMS TO REPORT IMMEDIATELY: A gastroenterologist can be reached at any hour. Please call [phone]  for any of the following symptoms:  Following lower endoscopy (colonoscopy, flexible sigmoidoscopy) Excessive amounts of blood in the stool  Significant tenderness, worsening of abdominal pains  Swelling of the abdomen that is  new, acute  Fever of 100 or higher    FOLLOW UP:  If any biopsies were taken you will be contacted by phone or by letter within the next 1-3 weeks. Call [phone]  if you have not heard about the biopsies in 3 weeks.  Please also call with any specific questions about appointments or follow up tests.  

## 2017-02-25 NOTE — Progress Notes (Signed)
Alert and oriented x3, pleased with MAC, report to RN  

## 2017-02-28 ENCOUNTER — Telehealth: Payer: Self-pay

## 2017-02-28 NOTE — Telephone Encounter (Signed)
  Follow up Call-  Call back number 02/25/2017  Post procedure Call Back phone  # [phone]  Permission to leave phone message Yes  Some recent data might be hidden     Patient questions:  Do you have a fever, pain , or abdominal swelling? No. Pain Score  0 *  Have you tolerated food without any problems? Yes.    Have you been able to return to your normal activities? Yes.    Do you have any questions about your discharge instructions: Diet   No. Medications  No. Follow up visit  No.  Do you have questions or concerns about your Care? No.  Actions: * If pain score is 4 or above: No action needed, pain <4.

## 2017-03-04 ENCOUNTER — Encounter: Payer: Self-pay | Admitting: Gastroenterology

## 2017-03-28 DIAGNOSIS — Z803 Family history of malignant neoplasm of breast: Secondary | ICD-10-CM | POA: Diagnosis not present

## 2017-03-28 DIAGNOSIS — Z1231 Encounter for screening mammogram for malignant neoplasm of breast: Secondary | ICD-10-CM | POA: Diagnosis not present

## 2017-03-28 DIAGNOSIS — N611 Abscess of the breast and nipple: Secondary | ICD-10-CM | POA: Diagnosis not present

## 2017-03-31 DIAGNOSIS — Z1231 Encounter for screening mammogram for malignant neoplasm of breast: Secondary | ICD-10-CM | POA: Diagnosis not present

## 2017-03-31 DIAGNOSIS — Z803 Family history of malignant neoplasm of breast: Secondary | ICD-10-CM | POA: Diagnosis not present

## 2017-03-31 DIAGNOSIS — N611 Abscess of the breast and nipple: Secondary | ICD-10-CM | POA: Diagnosis not present

## 2017-04-12 DIAGNOSIS — H04123 Dry eye syndrome of bilateral lacrimal glands: Secondary | ICD-10-CM | POA: Diagnosis not present

## 2017-04-12 DIAGNOSIS — H11423 Conjunctival edema, bilateral: Secondary | ICD-10-CM | POA: Diagnosis not present

## 2017-04-12 DIAGNOSIS — H40013 Open angle with borderline findings, low risk, bilateral: Secondary | ICD-10-CM | POA: Diagnosis not present

## 2017-04-12 DIAGNOSIS — Z961 Presence of intraocular lens: Secondary | ICD-10-CM | POA: Diagnosis not present

## 2017-04-12 DIAGNOSIS — H11153 Pinguecula, bilateral: Secondary | ICD-10-CM | POA: Diagnosis not present

## 2017-04-12 DIAGNOSIS — H353230 Exudative age-related macular degeneration, bilateral, stage unspecified: Secondary | ICD-10-CM | POA: Diagnosis not present

## 2017-04-12 DIAGNOSIS — H18413 Arcus senilis, bilateral: Secondary | ICD-10-CM | POA: Diagnosis not present

## 2017-04-12 DIAGNOSIS — Z9849 Cataract extraction status, unspecified eye: Secondary | ICD-10-CM | POA: Diagnosis not present

## 2017-04-12 DIAGNOSIS — H5340 Unspecified visual field defects: Secondary | ICD-10-CM | POA: Diagnosis not present

## 2017-04-25 ENCOUNTER — Encounter (INDEPENDENT_AMBULATORY_CARE_PROVIDER_SITE_OTHER): Payer: PPO | Admitting: Ophthalmology

## 2017-04-25 DIAGNOSIS — H353132 Nonexudative age-related macular degeneration, bilateral, intermediate dry stage: Secondary | ICD-10-CM | POA: Diagnosis not present

## 2017-04-25 DIAGNOSIS — H43813 Vitreous degeneration, bilateral: Secondary | ICD-10-CM | POA: Diagnosis not present

## 2017-05-19 DIAGNOSIS — L820 Inflamed seborrheic keratosis: Secondary | ICD-10-CM | POA: Diagnosis not present

## 2017-05-19 DIAGNOSIS — L821 Other seborrheic keratosis: Secondary | ICD-10-CM | POA: Diagnosis not present

## 2017-05-19 DIAGNOSIS — L603 Nail dystrophy: Secondary | ICD-10-CM | POA: Diagnosis not present

## 2017-06-07 DIAGNOSIS — N6311 Unspecified lump in the right breast, upper outer quadrant: Secondary | ICD-10-CM | POA: Diagnosis not present

## 2017-08-10 DIAGNOSIS — Z23 Encounter for immunization: Secondary | ICD-10-CM | POA: Diagnosis not present

## 2017-08-11 ENCOUNTER — Encounter (HOSPITAL_COMMUNITY): Payer: Self-pay

## 2018-01-04 DIAGNOSIS — I8312 Varicose veins of left lower extremity with inflammation: Secondary | ICD-10-CM | POA: Diagnosis not present

## 2018-01-04 DIAGNOSIS — I8311 Varicose veins of right lower extremity with inflammation: Secondary | ICD-10-CM | POA: Diagnosis not present

## 2018-01-12 DIAGNOSIS — N390 Urinary tract infection, site not specified: Secondary | ICD-10-CM | POA: Diagnosis not present

## 2018-01-12 DIAGNOSIS — Z0000 Encounter for general adult medical examination without abnormal findings: Secondary | ICD-10-CM | POA: Diagnosis not present

## 2018-01-12 DIAGNOSIS — E7800 Pure hypercholesterolemia, unspecified: Secondary | ICD-10-CM | POA: Diagnosis not present

## 2018-01-12 DIAGNOSIS — E559 Vitamin D deficiency, unspecified: Secondary | ICD-10-CM | POA: Diagnosis not present

## 2018-01-19 DIAGNOSIS — E669 Obesity, unspecified: Secondary | ICD-10-CM | POA: Diagnosis not present

## 2018-01-19 DIAGNOSIS — E559 Vitamin D deficiency, unspecified: Secondary | ICD-10-CM | POA: Diagnosis not present

## 2018-01-19 DIAGNOSIS — E7800 Pure hypercholesterolemia, unspecified: Secondary | ICD-10-CM | POA: Diagnosis not present

## 2018-01-19 DIAGNOSIS — Z0000 Encounter for general adult medical examination without abnormal findings: Secondary | ICD-10-CM | POA: Diagnosis not present

## 2018-01-19 DIAGNOSIS — Z01419 Encounter for gynecological examination (general) (routine) without abnormal findings: Secondary | ICD-10-CM | POA: Diagnosis not present

## 2018-01-20 DIAGNOSIS — I8311 Varicose veins of right lower extremity with inflammation: Secondary | ICD-10-CM | POA: Diagnosis not present

## 2018-01-20 DIAGNOSIS — I8312 Varicose veins of left lower extremity with inflammation: Secondary | ICD-10-CM | POA: Diagnosis not present

## 2018-03-07 DIAGNOSIS — I8311 Varicose veins of right lower extremity with inflammation: Secondary | ICD-10-CM | POA: Diagnosis not present

## 2018-03-07 DIAGNOSIS — I8312 Varicose veins of left lower extremity with inflammation: Secondary | ICD-10-CM | POA: Diagnosis not present

## 2018-03-20 DIAGNOSIS — Z1231 Encounter for screening mammogram for malignant neoplasm of breast: Secondary | ICD-10-CM | POA: Diagnosis not present

## 2018-03-20 DIAGNOSIS — Z803 Family history of malignant neoplasm of breast: Secondary | ICD-10-CM | POA: Diagnosis not present

## 2018-04-25 DIAGNOSIS — I8311 Varicose veins of right lower extremity with inflammation: Secondary | ICD-10-CM | POA: Diagnosis not present

## 2018-04-26 ENCOUNTER — Encounter (INDEPENDENT_AMBULATORY_CARE_PROVIDER_SITE_OTHER): Payer: PPO | Admitting: Ophthalmology

## 2018-04-27 DIAGNOSIS — I8311 Varicose veins of right lower extremity with inflammation: Secondary | ICD-10-CM | POA: Diagnosis not present

## 2018-05-02 ENCOUNTER — Encounter (INDEPENDENT_AMBULATORY_CARE_PROVIDER_SITE_OTHER): Payer: PPO | Admitting: Ophthalmology

## 2018-05-02 DIAGNOSIS — H353132 Nonexudative age-related macular degeneration, bilateral, intermediate dry stage: Secondary | ICD-10-CM

## 2018-05-02 DIAGNOSIS — H43813 Vitreous degeneration, bilateral: Secondary | ICD-10-CM | POA: Diagnosis not present

## 2018-05-24 DIAGNOSIS — I8311 Varicose veins of right lower extremity with inflammation: Secondary | ICD-10-CM | POA: Diagnosis not present

## 2018-06-07 DIAGNOSIS — I8311 Varicose veins of right lower extremity with inflammation: Secondary | ICD-10-CM | POA: Diagnosis not present

## 2018-06-21 DIAGNOSIS — I8311 Varicose veins of right lower extremity with inflammation: Secondary | ICD-10-CM | POA: Diagnosis not present

## 2018-06-21 DIAGNOSIS — M7981 Nontraumatic hematoma of soft tissue: Secondary | ICD-10-CM | POA: Diagnosis not present

## 2018-07-14 DIAGNOSIS — L304 Erythema intertrigo: Secondary | ICD-10-CM | POA: Diagnosis not present

## 2018-07-14 DIAGNOSIS — Z01419 Encounter for gynecological examination (general) (routine) without abnormal findings: Secondary | ICD-10-CM | POA: Diagnosis not present

## 2018-07-14 DIAGNOSIS — N950 Postmenopausal bleeding: Secondary | ICD-10-CM | POA: Diagnosis not present

## 2018-08-01 DIAGNOSIS — Z124 Encounter for screening for malignant neoplasm of cervix: Secondary | ICD-10-CM | POA: Diagnosis not present

## 2018-08-01 DIAGNOSIS — N950 Postmenopausal bleeding: Secondary | ICD-10-CM | POA: Diagnosis not present

## 2018-08-09 DIAGNOSIS — Z23 Encounter for immunization: Secondary | ICD-10-CM | POA: Diagnosis not present

## 2018-08-17 ENCOUNTER — Encounter (HOSPITAL_COMMUNITY): Payer: Self-pay

## 2018-08-21 DIAGNOSIS — N840 Polyp of corpus uteri: Secondary | ICD-10-CM | POA: Diagnosis not present

## 2018-08-21 DIAGNOSIS — N950 Postmenopausal bleeding: Secondary | ICD-10-CM | POA: Diagnosis not present

## 2018-09-07 DIAGNOSIS — N950 Postmenopausal bleeding: Secondary | ICD-10-CM | POA: Diagnosis not present

## 2018-09-07 DIAGNOSIS — Z01812 Encounter for preprocedural laboratory examination: Secondary | ICD-10-CM | POA: Diagnosis not present

## 2018-09-21 DIAGNOSIS — N840 Polyp of corpus uteri: Secondary | ICD-10-CM | POA: Diagnosis not present

## 2018-09-21 DIAGNOSIS — N950 Postmenopausal bleeding: Secondary | ICD-10-CM | POA: Diagnosis not present

## 2019-02-14 DIAGNOSIS — R42 Dizziness and giddiness: Secondary | ICD-10-CM | POA: Diagnosis not present

## 2019-05-09 ENCOUNTER — Other Ambulatory Visit: Payer: Self-pay

## 2019-05-09 ENCOUNTER — Encounter (INDEPENDENT_AMBULATORY_CARE_PROVIDER_SITE_OTHER): Payer: PPO | Admitting: Ophthalmology

## 2019-05-09 DIAGNOSIS — H43813 Vitreous degeneration, bilateral: Secondary | ICD-10-CM | POA: Diagnosis not present

## 2019-05-09 DIAGNOSIS — H353132 Nonexudative age-related macular degeneration, bilateral, intermediate dry stage: Secondary | ICD-10-CM | POA: Diagnosis not present

## 2019-05-31 DIAGNOSIS — N8501 Benign endometrial hyperplasia: Secondary | ICD-10-CM | POA: Diagnosis not present

## 2019-06-07 DIAGNOSIS — N8501 Benign endometrial hyperplasia: Secondary | ICD-10-CM | POA: Diagnosis not present

## 2019-06-21 DIAGNOSIS — N8501 Benign endometrial hyperplasia: Secondary | ICD-10-CM | POA: Diagnosis not present

## 2019-06-21 DIAGNOSIS — N950 Postmenopausal bleeding: Secondary | ICD-10-CM | POA: Diagnosis not present

## 2019-06-21 DIAGNOSIS — Z30430 Encounter for insertion of intrauterine contraceptive device: Secondary | ICD-10-CM | POA: Diagnosis not present

## 2019-06-21 DIAGNOSIS — N8500 Endometrial hyperplasia, unspecified: Secondary | ICD-10-CM | POA: Diagnosis not present

## 2019-07-13 DIAGNOSIS — H18413 Arcus senilis, bilateral: Secondary | ICD-10-CM | POA: Diagnosis not present

## 2019-07-13 DIAGNOSIS — H259 Unspecified age-related cataract: Secondary | ICD-10-CM | POA: Diagnosis not present

## 2019-07-13 DIAGNOSIS — H11153 Pinguecula, bilateral: Secondary | ICD-10-CM | POA: Diagnosis not present

## 2019-07-13 DIAGNOSIS — Z961 Presence of intraocular lens: Secondary | ICD-10-CM | POA: Diagnosis not present

## 2019-07-13 DIAGNOSIS — H35723 Serous detachment of retinal pigment epithelium, bilateral: Secondary | ICD-10-CM | POA: Diagnosis not present

## 2019-07-16 ENCOUNTER — Encounter (INDEPENDENT_AMBULATORY_CARE_PROVIDER_SITE_OTHER): Payer: PPO | Admitting: Ophthalmology

## 2019-07-16 ENCOUNTER — Inpatient Hospital Stay (HOSPITAL_COMMUNITY): Admission: RE | Admit: 2019-07-16 | Payer: PPO | Source: Ambulatory Visit

## 2019-07-16 DIAGNOSIS — H26491 Other secondary cataract, right eye: Secondary | ICD-10-CM | POA: Diagnosis not present

## 2019-07-16 DIAGNOSIS — H353231 Exudative age-related macular degeneration, bilateral, with active choroidal neovascularization: Secondary | ICD-10-CM

## 2019-07-16 DIAGNOSIS — H43813 Vitreous degeneration, bilateral: Secondary | ICD-10-CM

## 2019-07-17 ENCOUNTER — Encounter (HOSPITAL_COMMUNITY): Admission: RE | Payer: Self-pay | Source: Ambulatory Visit

## 2019-07-17 ENCOUNTER — Ambulatory Visit (HOSPITAL_COMMUNITY): Admission: RE | Admit: 2019-07-17 | Payer: PPO | Source: Ambulatory Visit | Admitting: Ophthalmology

## 2019-07-17 SURGERY — SCLERAL BUCKLE WITH POSSIBLE 25 GAUGE PARS PLANA VITRECTOMY
Anesthesia: General | Laterality: Right

## 2019-07-23 DIAGNOSIS — Z0000 Encounter for general adult medical examination without abnormal findings: Secondary | ICD-10-CM | POA: Diagnosis not present

## 2019-07-23 DIAGNOSIS — E538 Deficiency of other specified B group vitamins: Secondary | ICD-10-CM | POA: Diagnosis not present

## 2019-07-23 DIAGNOSIS — N390 Urinary tract infection, site not specified: Secondary | ICD-10-CM | POA: Diagnosis not present

## 2019-07-23 DIAGNOSIS — E559 Vitamin D deficiency, unspecified: Secondary | ICD-10-CM | POA: Diagnosis not present

## 2019-07-23 DIAGNOSIS — E7800 Pure hypercholesterolemia, unspecified: Secondary | ICD-10-CM | POA: Diagnosis not present

## 2019-07-31 DIAGNOSIS — Z0000 Encounter for general adult medical examination without abnormal findings: Secondary | ICD-10-CM | POA: Diagnosis not present

## 2019-07-31 DIAGNOSIS — E7800 Pure hypercholesterolemia, unspecified: Secondary | ICD-10-CM | POA: Diagnosis not present

## 2019-07-31 DIAGNOSIS — Z23 Encounter for immunization: Secondary | ICD-10-CM | POA: Diagnosis not present

## 2019-07-31 DIAGNOSIS — N390 Urinary tract infection, site not specified: Secondary | ICD-10-CM | POA: Diagnosis not present

## 2019-07-31 DIAGNOSIS — E559 Vitamin D deficiency, unspecified: Secondary | ICD-10-CM | POA: Diagnosis not present

## 2019-08-01 DIAGNOSIS — H26491 Other secondary cataract, right eye: Secondary | ICD-10-CM | POA: Diagnosis not present

## 2019-08-13 ENCOUNTER — Other Ambulatory Visit: Payer: Self-pay

## 2019-08-13 ENCOUNTER — Encounter (INDEPENDENT_AMBULATORY_CARE_PROVIDER_SITE_OTHER): Payer: PPO | Admitting: Ophthalmology

## 2019-08-13 DIAGNOSIS — H43813 Vitreous degeneration, bilateral: Secondary | ICD-10-CM | POA: Diagnosis not present

## 2019-08-13 DIAGNOSIS — H353231 Exudative age-related macular degeneration, bilateral, with active choroidal neovascularization: Secondary | ICD-10-CM

## 2019-08-29 DIAGNOSIS — N390 Urinary tract infection, site not specified: Secondary | ICD-10-CM | POA: Diagnosis not present

## 2019-09-06 DIAGNOSIS — R350 Frequency of micturition: Secondary | ICD-10-CM | POA: Diagnosis not present

## 2019-09-06 DIAGNOSIS — N390 Urinary tract infection, site not specified: Secondary | ICD-10-CM | POA: Diagnosis not present

## 2019-09-10 ENCOUNTER — Encounter (INDEPENDENT_AMBULATORY_CARE_PROVIDER_SITE_OTHER): Payer: PPO | Admitting: Ophthalmology

## 2019-09-10 DIAGNOSIS — H43813 Vitreous degeneration, bilateral: Secondary | ICD-10-CM

## 2019-09-10 DIAGNOSIS — H353231 Exudative age-related macular degeneration, bilateral, with active choroidal neovascularization: Secondary | ICD-10-CM | POA: Diagnosis not present

## 2019-09-26 ENCOUNTER — Ambulatory Visit: Payer: PPO

## 2019-09-26 DIAGNOSIS — N8501 Benign endometrial hyperplasia: Secondary | ICD-10-CM | POA: Diagnosis not present

## 2019-09-26 DIAGNOSIS — N858 Other specified noninflammatory disorders of uterus: Secondary | ICD-10-CM | POA: Diagnosis not present

## 2019-10-04 DIAGNOSIS — N3941 Urge incontinence: Secondary | ICD-10-CM | POA: Diagnosis not present

## 2019-10-04 DIAGNOSIS — R351 Nocturia: Secondary | ICD-10-CM | POA: Diagnosis not present

## 2019-10-04 DIAGNOSIS — R350 Frequency of micturition: Secondary | ICD-10-CM | POA: Diagnosis not present

## 2019-10-05 ENCOUNTER — Ambulatory Visit: Payer: PPO

## 2019-10-08 ENCOUNTER — Other Ambulatory Visit: Payer: Self-pay

## 2019-10-08 ENCOUNTER — Encounter (INDEPENDENT_AMBULATORY_CARE_PROVIDER_SITE_OTHER): Payer: PPO | Admitting: Ophthalmology

## 2019-10-08 DIAGNOSIS — H43813 Vitreous degeneration, bilateral: Secondary | ICD-10-CM | POA: Diagnosis not present

## 2019-10-08 DIAGNOSIS — H353231 Exudative age-related macular degeneration, bilateral, with active choroidal neovascularization: Secondary | ICD-10-CM

## 2019-10-17 ENCOUNTER — Ambulatory Visit: Payer: PPO

## 2019-11-05 ENCOUNTER — Encounter (INDEPENDENT_AMBULATORY_CARE_PROVIDER_SITE_OTHER): Payer: PPO | Admitting: Ophthalmology

## 2019-11-05 DIAGNOSIS — H353231 Exudative age-related macular degeneration, bilateral, with active choroidal neovascularization: Secondary | ICD-10-CM

## 2019-11-05 DIAGNOSIS — H43813 Vitreous degeneration, bilateral: Secondary | ICD-10-CM

## 2019-11-14 DIAGNOSIS — N3941 Urge incontinence: Secondary | ICD-10-CM | POA: Diagnosis not present

## 2019-11-14 DIAGNOSIS — R351 Nocturia: Secondary | ICD-10-CM | POA: Diagnosis not present

## 2019-11-26 DIAGNOSIS — T63421A Toxic effect of venom of ants, accidental (unintentional), initial encounter: Secondary | ICD-10-CM | POA: Diagnosis not present

## 2019-12-03 ENCOUNTER — Encounter (INDEPENDENT_AMBULATORY_CARE_PROVIDER_SITE_OTHER): Payer: PPO | Admitting: Ophthalmology

## 2019-12-03 DIAGNOSIS — H353231 Exudative age-related macular degeneration, bilateral, with active choroidal neovascularization: Secondary | ICD-10-CM | POA: Diagnosis not present

## 2019-12-03 DIAGNOSIS — H43813 Vitreous degeneration, bilateral: Secondary | ICD-10-CM | POA: Diagnosis not present

## 2019-12-31 ENCOUNTER — Other Ambulatory Visit: Payer: Self-pay

## 2019-12-31 ENCOUNTER — Encounter (INDEPENDENT_AMBULATORY_CARE_PROVIDER_SITE_OTHER): Payer: PPO | Admitting: Ophthalmology

## 2019-12-31 DIAGNOSIS — H353231 Exudative age-related macular degeneration, bilateral, with active choroidal neovascularization: Secondary | ICD-10-CM | POA: Diagnosis not present

## 2019-12-31 DIAGNOSIS — H43813 Vitreous degeneration, bilateral: Secondary | ICD-10-CM

## 2020-01-17 DIAGNOSIS — Z1231 Encounter for screening mammogram for malignant neoplasm of breast: Secondary | ICD-10-CM | POA: Diagnosis not present

## 2020-01-29 ENCOUNTER — Encounter (INDEPENDENT_AMBULATORY_CARE_PROVIDER_SITE_OTHER): Payer: PPO | Admitting: Ophthalmology

## 2020-01-29 ENCOUNTER — Other Ambulatory Visit: Payer: Self-pay

## 2020-01-29 DIAGNOSIS — H43813 Vitreous degeneration, bilateral: Secondary | ICD-10-CM

## 2020-01-29 DIAGNOSIS — H353231 Exudative age-related macular degeneration, bilateral, with active choroidal neovascularization: Secondary | ICD-10-CM | POA: Diagnosis not present

## 2020-02-25 ENCOUNTER — Other Ambulatory Visit: Payer: Self-pay

## 2020-02-25 ENCOUNTER — Encounter (INDEPENDENT_AMBULATORY_CARE_PROVIDER_SITE_OTHER): Payer: PPO | Admitting: Ophthalmology

## 2020-02-25 DIAGNOSIS — H353231 Exudative age-related macular degeneration, bilateral, with active choroidal neovascularization: Secondary | ICD-10-CM | POA: Diagnosis not present

## 2020-02-25 DIAGNOSIS — H43813 Vitreous degeneration, bilateral: Secondary | ICD-10-CM

## 2020-03-24 ENCOUNTER — Encounter (INDEPENDENT_AMBULATORY_CARE_PROVIDER_SITE_OTHER): Payer: PPO | Admitting: Ophthalmology

## 2020-03-24 ENCOUNTER — Other Ambulatory Visit: Payer: Self-pay

## 2020-03-24 DIAGNOSIS — H353231 Exudative age-related macular degeneration, bilateral, with active choroidal neovascularization: Secondary | ICD-10-CM

## 2020-03-24 DIAGNOSIS — H43813 Vitreous degeneration, bilateral: Secondary | ICD-10-CM | POA: Diagnosis not present

## 2020-03-25 ENCOUNTER — Other Ambulatory Visit: Payer: Self-pay

## 2020-03-26 DIAGNOSIS — N858 Other specified noninflammatory disorders of uterus: Secondary | ICD-10-CM | POA: Diagnosis not present

## 2020-03-26 DIAGNOSIS — N8501 Benign endometrial hyperplasia: Secondary | ICD-10-CM | POA: Diagnosis not present

## 2020-04-03 ENCOUNTER — Other Ambulatory Visit: Payer: Self-pay

## 2020-04-03 ENCOUNTER — Ambulatory Visit (AMBULATORY_SURGERY_CENTER): Payer: Self-pay

## 2020-04-03 ENCOUNTER — Encounter: Payer: Self-pay | Admitting: Gastroenterology

## 2020-04-03 VITALS — Ht 62.0 in | Wt 204.4 lb

## 2020-04-03 DIAGNOSIS — Z86010 Personal history of colonic polyps: Secondary | ICD-10-CM

## 2020-04-03 MED ORDER — SUTAB 1479-225-188 MG PO TABS: 1.0000 | ORAL_TABLET | ORAL | 0 refills | Status: DC

## 2020-04-03 NOTE — Progress Notes (Signed)
No egg or soy allergy known to patient  No issues with past sedation with any surgeries or procedures No intubation problems in the past  No FH of Malignant Hyperthermia No diet pills per patient No home 02 use per patient  No blood thinners per patient  Pt denies issues with constipation  No A fib or A flutter  EMMI video via [place] 19 guidelines implemented in PV today with Pt and RN   Sutab sample given to patient at this pre visit MQT#[id number] EXP:01/23  COVID vaccines completed on 10/2019 per pt;   Due to the COVID-19 pandemic we are asking patients to follow these guidelines. Please only bring one care partner. Please be aware that your care partner may wait in the car in the parking lot or if they feel like they will be too hot to wait in the car, they may wait in the lobby on the 4th floor. All care partners are required to wear a mask the entire time (we do not have any that we can provide them), they need to practice social distancing, and we will do a Covid check for all patient's and care partners when you arrive. Also we will check their temperature and your temperature. If the care partner waits in their car they need to stay in the parking lot the entire time and we will call them on their cell phone when the patient is ready for discharge so they can bring the car to the front of the building. Also all patient's will need to wear a mask into building.

## 2020-04-16 ENCOUNTER — Encounter: Payer: Self-pay | Admitting: Certified Registered Nurse Anesthetist

## 2020-04-17 ENCOUNTER — Ambulatory Visit (AMBULATORY_SURGERY_CENTER): Payer: PPO | Admitting: Gastroenterology

## 2020-04-17 ENCOUNTER — Other Ambulatory Visit: Payer: Self-pay

## 2020-04-17 ENCOUNTER — Encounter: Payer: Self-pay | Admitting: Gastroenterology

## 2020-04-17 VITALS — BP 130/72 | HR 63 | Temp 97.8°F | Resp 15 | Ht 62.0 in | Wt 204.4 lb

## 2020-04-17 DIAGNOSIS — D123 Benign neoplasm of transverse colon: Secondary | ICD-10-CM | POA: Diagnosis not present

## 2020-04-17 DIAGNOSIS — D125 Benign neoplasm of sigmoid colon: Secondary | ICD-10-CM | POA: Diagnosis not present

## 2020-04-17 DIAGNOSIS — D122 Benign neoplasm of ascending colon: Secondary | ICD-10-CM

## 2020-04-17 DIAGNOSIS — Z86010 Personal history of colonic polyps: Secondary | ICD-10-CM

## 2020-04-17 DIAGNOSIS — Z1211 Encounter for screening for malignant neoplasm of colon: Secondary | ICD-10-CM | POA: Diagnosis not present

## 2020-04-17 DIAGNOSIS — I2510 Atherosclerotic heart disease of native coronary artery without angina pectoris: Secondary | ICD-10-CM | POA: Diagnosis not present

## 2020-04-17 MED ORDER — SODIUM CHLORIDE 0.9 % IV SOLN
500.0000 mL | Freq: Once | INTRAVENOUS | Status: DC
Start: 2020-04-17 — End: 2021-04-16

## 2020-04-17 NOTE — Progress Notes (Signed)
Report given to PACU, vss 

## 2020-04-17 NOTE — Progress Notes (Signed)
Called to room to assist during endoscopic procedure.  Patient ID and intended procedure confirmed with present staff. Received instructions for my participation in the procedure from the performing physician.  

## 2020-04-17 NOTE — Op Note (Signed)
[facility] Patient Name: Jessica Livingston Procedure Date: 04/17/2020 9:49 AM MRN: [id number] Endoscopist: Remo Lipps P. Havery Moros , MD Age: 79 Referring MD:  Date of Birth: May 10, 1941 Gender: Female Account #: [id number] Procedure:                Colonoscopy Indications:              High risk colon cancer surveillance: Personal                            history of colonic polyps - 9 polyps removed in                            01/2017 Medicines:                Monitored Anesthesia Care Procedure:                Pre-Anesthesia Assessment:                           - Prior to the procedure, a History and Physical                            was performed, and patient medications and                            allergies were reviewed. The patient's tolerance of                            previous anesthesia was also reviewed. The risks                            and benefits of the procedure and the sedation                            options and risks were discussed with the patient.                            All questions were answered, and informed consent                            was obtained. Prior Anticoagulants: The patient has                            taken no previous anticoagulant or antiplatelet                            agents. ASA Grade Assessment: III - A patient with                            severe systemic disease. After reviewing the risks                            and benefits, the patient was deemed in  satisfactory condition to undergo the procedure.                           After obtaining informed consent, the colonoscope                            was passed under direct vision. Throughout the                            procedure, the patient's blood pressure, pulse, and                            oxygen saturations were monitored continuously. The                            Colonoscope was introduced through the anus and                             advanced to the the cecum, identified by                            appendiceal orifice and ileocecal valve. The                            colonoscopy was performed without difficulty. The                            patient tolerated the procedure well. The quality                            of the bowel preparation was good. The ileocecal                            valve, appendiceal orifice, and rectum were                            photographed. Scope In: 9:58:03 AM Scope Out: 10:15:26 AM Scope Withdrawal Time: 0 hours 14 minutes 26 seconds  Total Procedure Duration: 0 hours 17 minutes 23 seconds  Findings:                 The perianal and digital rectal examinations were                            normal.                           A 3 mm polyp was found in the ascending colon. The                            polyp was sessile. The polyp was removed with a                            cold snare. Resection and retrieval were complete.  A 3 mm polyp was found in the sigmoid colon. The                            polyp was sessile. The polyp was removed with a                            cold snare. Resection and retrieval were complete.                           Scattered medium-mouthed diverticula were found in                            the transverse colon and left colon.                           Internal hemorrhoids were found during                            retroflexion. The hemorrhoids were small.                           The exam was otherwise without abnormality. Complications:            No immediate complications. Estimated blood loss:                            Minimal. Estimated Blood Loss:     Estimated blood loss was minimal. Impression:               - One 3 mm polyp in the ascending colon, removed                            with a cold snare. Resected and retrieved.                           - One 3 mm polyp in the  sigmoid colon, removed with                            a cold snare. Resected and retrieved.                           - Diverticulosis in the transverse colon and in the                            left colon.                           - Internal hemorrhoids.                           - The examination was otherwise normal. Recommendation:           - Patient has a contact number available for  emergencies. The signs and symptoms of potential                            delayed complications were discussed with the                            patient. Return to normal activities tomorrow.                            Written discharge instructions were provided to the                            patient.                           - Resume previous diet.                           - Continue present medications.                           - Await pathology results.                           - No further surveillance exams warranted due to                            the polyps on this exam are small and the patient's                            age Jessica Livingston. Jessica Holzworth, MD 04/17/2020 10:19:27 AM This report has been signed electronically.

## 2020-04-17 NOTE — Patient Instructions (Signed)
Impression/Recommendations:  Polyp handout given to patient. Diverticulosis handout given to patient. Hemorrhoid handout given to patient.  Resume previous diet. Continue present medications. Await pathology results.  No further surveillance colonoscopies indicated.  YOU HAD AN ENDOSCOPIC PROCEDURE TODAY AT [facility]:   Refer to the procedure report that was given to you for any specific questions about what was found during the examination.  If the procedure report does not answer your questions, please call your gastroenterologist to clarify.  If you requested that your care partner not be given the details of your procedure findings, then the procedure report has been included in a sealed envelope for you to review at your convenience later.  YOU SHOULD EXPECT: Some feelings of bloating in the abdomen. Passage of more gas than usual.  Walking can help get rid of the air that was put into your GI tract during the procedure and reduce the bloating. If you had a lower endoscopy (such as a colonoscopy or flexible sigmoidoscopy) you may notice spotting of blood in your stool or on the toilet paper. If you underwent a bowel prep for your procedure, you may not have a normal bowel movement for a few days.  Please Note:  You might notice some irritation and congestion in your nose or some drainage.  This is from the oxygen used during your procedure.  There is no need for concern and it should clear up in a day or so.  SYMPTOMS TO REPORT IMMEDIATELY:   Following lower endoscopy (colonoscopy or flexible sigmoidoscopy):  Excessive amounts of blood in the stool  Significant tenderness or worsening of abdominal pains  Swelling of the abdomen that is new, acute  Fever of 100F or higher For urgent or emergent issues, a gastroenterologist can be reached at any hour by calling [phone]. Do not use MyChart messaging for urgent concerns.    DIET:  We do recommend a small meal  at first, but then you may proceed to your regular diet.  Drink plenty of fluids but you should avoid alcoholic beverages for 24 hours.  ACTIVITY:  You should plan to take it easy for the rest of today and you should NOT DRIVE or use heavy machinery until tomorrow (because of the sedation medicines used during the test).    FOLLOW UP: Our staff will call the number listed on your records 48-72 hours following your procedure to check on you and address any questions or concerns that you may have regarding the information given to you following your procedure. If we do not reach you, we will leave a message.  We will attempt to reach you two times.  During this call, we will ask if you have developed any symptoms of COVID 19. If you develop any symptoms (ie: fever, flu-like symptoms, shortness of breath, cough etc.) before then, please call [phone].  If you test positive for Covid 19 in the 2 weeks post procedure, please call and report this information to [place].    If any biopsies were taken you will be contacted by phone or by letter within the next 1-3 weeks.  Please call us at [phone] if you have not heard about the biopsies in 3 weeks.    SIGNATURES/CONFIDENTIALITY: You and/or your care partner have signed paperwork which will be entered into your electronic medical record.  These signatures attest to the fact that that the information above on your After Visit Summary has been reviewed and is understood.  Full  responsibility of the confidentiality of this discharge information lies with you and/or your care-partner.

## 2020-04-17 NOTE — Progress Notes (Signed)
VS taken by C.W. 

## 2020-04-17 NOTE — Progress Notes (Signed)
Pt's states no medical or surgical changes since previsit or office visit. 

## 2020-04-21 ENCOUNTER — Other Ambulatory Visit: Payer: Self-pay

## 2020-04-21 ENCOUNTER — Encounter (INDEPENDENT_AMBULATORY_CARE_PROVIDER_SITE_OTHER): Payer: PPO | Admitting: Ophthalmology

## 2020-04-21 ENCOUNTER — Telehealth: Payer: Self-pay

## 2020-04-21 DIAGNOSIS — H43813 Vitreous degeneration, bilateral: Secondary | ICD-10-CM

## 2020-04-21 DIAGNOSIS — H353231 Exudative age-related macular degeneration, bilateral, with active choroidal neovascularization: Secondary | ICD-10-CM | POA: Diagnosis not present

## 2020-04-21 NOTE — Telephone Encounter (Signed)
Covid-19 screening questions   Do you now or have you had a fever in the last 14 days? No.  Do you have any respiratory symptoms of shortness of breath or cough now or in the last 14 days? No.  Do you have any family members or close contacts with diagnosed or suspected Covid-19 in the past 14 days? No.  Have you been tested for Covid-19 and found to be positive? No.       Follow up Call-  Call back number 04/17/2020  Post procedure Call Back phone  # [phone]  Permission to leave phone message Yes  Some recent data might be hidden     Patient questions:  Do you have a fever, pain , or abdominal swelling? No. Pain Score  0 *  Have you tolerated food without any problems? Yes.    Have you been able to return to your normal activities? Yes.    Do you have any questions about your discharge instructions: Diet   No. Medications  No. Follow up visit  No.  Do you have questions or concerns about your Care? No.  Actions: * If pain score is 4 or above: No action needed, pain <4.

## 2020-04-22 DIAGNOSIS — N8501 Benign endometrial hyperplasia: Secondary | ICD-10-CM | POA: Diagnosis not present

## 2020-05-09 ENCOUNTER — Encounter (INDEPENDENT_AMBULATORY_CARE_PROVIDER_SITE_OTHER): Payer: PPO | Admitting: Ophthalmology

## 2020-05-19 ENCOUNTER — Encounter (INDEPENDENT_AMBULATORY_CARE_PROVIDER_SITE_OTHER): Payer: PPO | Admitting: Ophthalmology

## 2020-05-19 ENCOUNTER — Other Ambulatory Visit: Payer: Self-pay

## 2020-05-19 DIAGNOSIS — H353231 Exudative age-related macular degeneration, bilateral, with active choroidal neovascularization: Secondary | ICD-10-CM

## 2020-05-19 DIAGNOSIS — I10 Essential (primary) hypertension: Secondary | ICD-10-CM

## 2020-05-19 DIAGNOSIS — H43813 Vitreous degeneration, bilateral: Secondary | ICD-10-CM | POA: Diagnosis not present

## 2020-05-19 DIAGNOSIS — H35033 Hypertensive retinopathy, bilateral: Secondary | ICD-10-CM

## 2020-05-29 DIAGNOSIS — N858 Other specified noninflammatory disorders of uterus: Secondary | ICD-10-CM | POA: Diagnosis not present

## 2020-05-29 DIAGNOSIS — N8501 Benign endometrial hyperplasia: Secondary | ICD-10-CM | POA: Diagnosis not present

## 2020-05-29 DIAGNOSIS — Z30433 Encounter for removal and reinsertion of intrauterine contraceptive device: Secondary | ICD-10-CM | POA: Diagnosis not present

## 2020-06-16 ENCOUNTER — Other Ambulatory Visit: Payer: Self-pay

## 2020-06-16 ENCOUNTER — Encounter (INDEPENDENT_AMBULATORY_CARE_PROVIDER_SITE_OTHER): Payer: PPO | Admitting: Ophthalmology

## 2020-06-16 DIAGNOSIS — H43813 Vitreous degeneration, bilateral: Secondary | ICD-10-CM

## 2020-06-16 DIAGNOSIS — H353231 Exudative age-related macular degeneration, bilateral, with active choroidal neovascularization: Secondary | ICD-10-CM

## 2020-07-14 ENCOUNTER — Other Ambulatory Visit: Payer: Self-pay

## 2020-07-14 ENCOUNTER — Encounter (INDEPENDENT_AMBULATORY_CARE_PROVIDER_SITE_OTHER): Payer: PPO | Admitting: Ophthalmology

## 2020-07-14 DIAGNOSIS — H353231 Exudative age-related macular degeneration, bilateral, with active choroidal neovascularization: Secondary | ICD-10-CM

## 2020-07-14 DIAGNOSIS — H43813 Vitreous degeneration, bilateral: Secondary | ICD-10-CM | POA: Diagnosis not present

## 2020-08-11 ENCOUNTER — Other Ambulatory Visit: Payer: Self-pay

## 2020-08-11 ENCOUNTER — Encounter (INDEPENDENT_AMBULATORY_CARE_PROVIDER_SITE_OTHER): Payer: PPO | Admitting: Ophthalmology

## 2020-08-11 DIAGNOSIS — H43813 Vitreous degeneration, bilateral: Secondary | ICD-10-CM

## 2020-08-11 DIAGNOSIS — H353231 Exudative age-related macular degeneration, bilateral, with active choroidal neovascularization: Secondary | ICD-10-CM

## 2020-08-30 DIAGNOSIS — C801 Malignant (primary) neoplasm, unspecified: Secondary | ICD-10-CM

## 2020-08-30 HISTORY — DX: Malignant (primary) neoplasm, unspecified: C80.1

## 2020-09-08 ENCOUNTER — Other Ambulatory Visit: Payer: Self-pay

## 2020-09-08 ENCOUNTER — Encounter (INDEPENDENT_AMBULATORY_CARE_PROVIDER_SITE_OTHER): Payer: PPO | Admitting: Ophthalmology

## 2020-09-08 DIAGNOSIS — H43813 Vitreous degeneration, bilateral: Secondary | ICD-10-CM | POA: Diagnosis not present

## 2020-09-08 DIAGNOSIS — H2703 Aphakia, bilateral: Secondary | ICD-10-CM | POA: Diagnosis not present

## 2020-09-08 DIAGNOSIS — H353231 Exudative age-related macular degeneration, bilateral, with active choroidal neovascularization: Secondary | ICD-10-CM

## 2020-09-24 DIAGNOSIS — E559 Vitamin D deficiency, unspecified: Secondary | ICD-10-CM | POA: Diagnosis not present

## 2020-09-24 DIAGNOSIS — Z0000 Encounter for general adult medical examination without abnormal findings: Secondary | ICD-10-CM | POA: Diagnosis not present

## 2020-09-24 DIAGNOSIS — E7800 Pure hypercholesterolemia, unspecified: Secondary | ICD-10-CM | POA: Diagnosis not present

## 2020-09-24 DIAGNOSIS — N390 Urinary tract infection, site not specified: Secondary | ICD-10-CM | POA: Diagnosis not present

## 2020-09-24 DIAGNOSIS — E538 Deficiency of other specified B group vitamins: Secondary | ICD-10-CM | POA: Diagnosis not present

## 2020-10-01 DIAGNOSIS — E559 Vitamin D deficiency, unspecified: Secondary | ICD-10-CM | POA: Diagnosis not present

## 2020-10-01 DIAGNOSIS — Z0000 Encounter for general adult medical examination without abnormal findings: Secondary | ICD-10-CM | POA: Diagnosis not present

## 2020-10-01 DIAGNOSIS — E538 Deficiency of other specified B group vitamins: Secondary | ICD-10-CM | POA: Diagnosis not present

## 2020-10-01 DIAGNOSIS — R7301 Impaired fasting glucose: Secondary | ICD-10-CM | POA: Diagnosis not present

## 2020-10-06 ENCOUNTER — Encounter (INDEPENDENT_AMBULATORY_CARE_PROVIDER_SITE_OTHER): Payer: PPO | Admitting: Ophthalmology

## 2020-10-06 ENCOUNTER — Other Ambulatory Visit: Payer: Self-pay

## 2020-10-06 DIAGNOSIS — H353231 Exudative age-related macular degeneration, bilateral, with active choroidal neovascularization: Secondary | ICD-10-CM

## 2020-10-06 DIAGNOSIS — H43813 Vitreous degeneration, bilateral: Secondary | ICD-10-CM | POA: Diagnosis not present

## 2020-11-03 ENCOUNTER — Other Ambulatory Visit: Payer: Self-pay

## 2020-11-03 ENCOUNTER — Encounter (INDEPENDENT_AMBULATORY_CARE_PROVIDER_SITE_OTHER): Payer: PPO | Admitting: Ophthalmology

## 2020-11-03 DIAGNOSIS — H43813 Vitreous degeneration, bilateral: Secondary | ICD-10-CM

## 2020-11-03 DIAGNOSIS — H353231 Exudative age-related macular degeneration, bilateral, with active choroidal neovascularization: Secondary | ICD-10-CM | POA: Diagnosis not present

## 2020-12-01 ENCOUNTER — Encounter (INDEPENDENT_AMBULATORY_CARE_PROVIDER_SITE_OTHER): Payer: PPO | Admitting: Ophthalmology

## 2020-12-01 ENCOUNTER — Other Ambulatory Visit: Payer: Self-pay

## 2020-12-01 DIAGNOSIS — H353231 Exudative age-related macular degeneration, bilateral, with active choroidal neovascularization: Secondary | ICD-10-CM

## 2020-12-01 DIAGNOSIS — H43813 Vitreous degeneration, bilateral: Secondary | ICD-10-CM | POA: Diagnosis not present

## 2020-12-09 DIAGNOSIS — N8501 Benign endometrial hyperplasia: Secondary | ICD-10-CM | POA: Diagnosis not present

## 2020-12-09 DIAGNOSIS — N719 Inflammatory disease of uterus, unspecified: Secondary | ICD-10-CM | POA: Diagnosis not present

## 2020-12-29 ENCOUNTER — Encounter (INDEPENDENT_AMBULATORY_CARE_PROVIDER_SITE_OTHER): Payer: PPO | Admitting: Ophthalmology

## 2020-12-29 ENCOUNTER — Other Ambulatory Visit: Payer: Self-pay

## 2020-12-29 DIAGNOSIS — H353231 Exudative age-related macular degeneration, bilateral, with active choroidal neovascularization: Secondary | ICD-10-CM

## 2020-12-29 DIAGNOSIS — H43813 Vitreous degeneration, bilateral: Secondary | ICD-10-CM

## 2021-01-22 DIAGNOSIS — Z780 Asymptomatic menopausal state: Secondary | ICD-10-CM | POA: Diagnosis not present

## 2021-01-22 DIAGNOSIS — Z1231 Encounter for screening mammogram for malignant neoplasm of breast: Secondary | ICD-10-CM | POA: Diagnosis not present

## 2021-01-27 ENCOUNTER — Encounter (INDEPENDENT_AMBULATORY_CARE_PROVIDER_SITE_OTHER): Payer: PPO | Admitting: Ophthalmology

## 2021-01-27 ENCOUNTER — Other Ambulatory Visit: Payer: Self-pay

## 2021-01-27 DIAGNOSIS — H353231 Exudative age-related macular degeneration, bilateral, with active choroidal neovascularization: Secondary | ICD-10-CM

## 2021-01-27 DIAGNOSIS — H43813 Vitreous degeneration, bilateral: Secondary | ICD-10-CM

## 2021-02-04 DIAGNOSIS — R921 Mammographic calcification found on diagnostic imaging of breast: Secondary | ICD-10-CM | POA: Diagnosis not present

## 2021-02-04 DIAGNOSIS — R928 Other abnormal and inconclusive findings on diagnostic imaging of breast: Secondary | ICD-10-CM | POA: Diagnosis not present

## 2021-02-17 ENCOUNTER — Other Ambulatory Visit: Payer: Self-pay

## 2021-02-17 DIAGNOSIS — D0512 Intraductal carcinoma in situ of left breast: Secondary | ICD-10-CM | POA: Diagnosis not present

## 2021-02-18 ENCOUNTER — Encounter: Payer: Self-pay | Admitting: Obstetrics and Gynecology

## 2021-02-19 ENCOUNTER — Encounter: Payer: Self-pay | Admitting: *Deleted

## 2021-02-19 ENCOUNTER — Telehealth: Payer: Self-pay | Admitting: Hematology and Oncology

## 2021-02-19 DIAGNOSIS — D0512 Intraductal carcinoma in situ of left breast: Secondary | ICD-10-CM

## 2021-02-19 NOTE — Telephone Encounter (Signed)
Spoke to patient to confirm appointment, patient wasn't in a position to take call but wanted me to email the information to her

## 2021-02-20 DIAGNOSIS — D0512 Intraductal carcinoma in situ of left breast: Secondary | ICD-10-CM | POA: Insufficient documentation

## 2021-02-24 ENCOUNTER — Other Ambulatory Visit: Payer: Self-pay

## 2021-02-24 ENCOUNTER — Encounter (INDEPENDENT_AMBULATORY_CARE_PROVIDER_SITE_OTHER): Payer: PPO | Admitting: Ophthalmology

## 2021-02-24 DIAGNOSIS — H353231 Exudative age-related macular degeneration, bilateral, with active choroidal neovascularization: Secondary | ICD-10-CM | POA: Diagnosis not present

## 2021-02-24 DIAGNOSIS — H43813 Vitreous degeneration, bilateral: Secondary | ICD-10-CM | POA: Diagnosis not present

## 2021-02-24 NOTE — Progress Notes (Signed)
[facility] NOTE  Patient Care Team: Deland Pretty, MD as PCP - General (Internal Medicine) Mauro Kaufmann, RN as Oncology Nurse Navigator Rockwell Germany, RN as Oncology Nurse Navigator  CHIEF COMPLAINTS/PURPOSE OF CONSULTATION:  Newly diagnosed breast cancer  HISTORY OF PRESENTING ILLNESS:  Jessica Livingston 80 y.o. female is here because of recent diagnosis of DCIS of the left breast.   Screening mammogram on 01/22/2021 showed possible mass with calcifications of the left breast that needs additional imaging.  Diagnostic mammogram and [place] on 02/04/2021 showed 0.9 cm grouped heterogenous calcifications on the left breast suspicious of malignancy.   Biopsy on 02/17/2021 showed high grade ductal carcinoma in situ with necrosis and calcifications.  She presents to the clinic today for initial evaluation and discussion of treatment options.   I reviewed her records extensively and collaborated the history with the patient.  SUMMARY OF ONCOLOGIC HISTORY: Oncology History  Ductal carcinoma in situ (DCIS) of left breast  02/17/2021 Initial Diagnosis   Screening mammogram detected left breast calcifications lower inner quadrant 0.9 cm: Biopsy revealed high-grade DCIS with necrosis and calcifications, ER 30%, PR 50% weak   02/25/2021 Cancer Staging   Staging form: Breast, AJCC 8th Edition - Clinical stage from 02/25/2021: Stage 0 (cTis (DCIS), cN0, cM0, G3, ER+, PR+, HER2: Not Assessed) - Signed by Nicholas Lose, MD on 02/25/2021  Stage prefix: Initial diagnosis  Histologic grading system: 3 grade system      MEDICAL HISTORY:  Past Medical History:  Diagnosis Date   CAD (coronary artery disease)    Cataract 2018   had sx   Depression    Heart disease '   HLD (hyperlipidemia)    Obesity, unspecified    Other abnormal glucose    Other B-complex deficiencies    Other seborrheic keratosis    Postmenopausal     SURGICAL HISTORY: Past Surgical History:   Procedure Laterality Date   CARDIAC CATHETERIZATION  5/03   mild to mod single vessel disease   CHOLECYSTECTOMY     COLONOSCOPY  2018   HPP/TA   LAPAROSCOPIC GASTRIC BANDING  01/13/2010   NECK SURGERY     POLYPECTOMY  2018   TONSILLECTOMY     TUBAL LIGATION Bilateral     SOCIAL HISTORY: Social History   Socioeconomic History   Marital status: Married    Spouse name: Not on file   Number of children: 3   Years of education: Not on file   Highest education level: Not on file  Occupational History   Occupation: retired    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  Tobacco Use   Smoking status: Never   Smokeless tobacco: Never  Vaping Use   Vaping Use: Never used  Substance and Sexual Activity   Alcohol use: Yes    Comment: rarely    Drug use: No   Sexual activity: Never  Other Topics Concern   Not on file  Social History Narrative   Married      3 children; 1 son-suicide      Retired      Geologist, 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   Social Determinants of Radio broadcast assistant Strain: Not on file  Food Insecurity: Not on file  Transportation Needs: Not on file  Physical Activity: Not on file  Stress: Not on file  Social Connections: Not on file  Intimate Partner  Violence: Not on file    FAMILY HISTORY: Family History  Problem Relation Age of Onset   Heart attack Father 75   Heart disease Father    Aneurysm Father 10   Breast cancer Mother 26   Thyroid disease Mother 20   Hypertension Mother    Hyperlipidemia Mother    Lung cancer Mother 13   Kidney disease Mother 98   Diabetes Paternal Grandmother    Diabetes Brother    Colon polyps Neg Hx    Colon cancer Neg Hx    Esophageal cancer Neg Hx    Rectal cancer Neg Hx    Stomach cancer Neg Hx     ALLERGIES:  is allergic to atorvastatin.  MEDICATIONS:  Current Outpatient Medications  Medication Sig Dispense Refill   Besifloxacin HCl (BESIVANCE) 0.6 %  SUSP Besivance 0.6 % eye drops,suspension     levonorgestrel (MIRENA, 52 MG,) 20 MCG/24HR IUD Mirena 20 mcg/24 hours (6 yrs) 52 mg intrauterine device  Take 1 device by intrauterine route.     trimethoprim-polymyxin b (POLYTRIM) ophthalmic solution INSTILL ONE DROP INTO RIGHT EYE 4 TIMES A DAY FOR 2 DAYS AFTER EACH MONTHLY EYE INJECTION     Current Facility-Administered Medications  Medication Dose Route Frequency Provider Last Rate Last Admin   0.9 %  sodium chloride infusion  500 mL Intravenous Once Armbruster, [name], MD        REVIEW OF SYSTEMS:   Constitutional: Denies fevers, chills or abnormal night sweats Eyes: Denies blurriness of vision, double vision or watery eyes Ears, nose, mouth, throat, and face: Denies mucositis or sore throat Respiratory: Denies cough, dyspnea or wheezes Cardiovascular: Denies palpitation, chest discomfort or lower extremity swelling Gastrointestinal:  Denies nausea, heartburn or change in bowel habits Skin: Denies abnormal skin rashes Lymphatics: Denies new lymphadenopathy or easy bruising Neurological:Denies numbness, tingling or new weaknesses Behavioral/Psych: Mood is stable, no new changes  Breast:  Denies any palpable lumps or discharge All other systems were reviewed with the patient and are negative.  PHYSICAL EXAMINATION: ECOG PERFORMANCE STATUS: 1 - Symptomatic but completely ambulatory  Vitals:   02/25/21 1302  BP: (!) 142/61  Pulse: 64  Resp: 18  Temp: 97.9 F (36.6 C)  SpO2: 96%   Filed Weights   02/25/21 1302  Weight: 213 lb 4.8 oz (96.8 kg)       LABORATORY DATA:  I have reviewed the data as listed Lab Results  Component Value Date   WBC 7.0 02/25/2021   HGB 15.0 02/25/2021   HCT 45.2 02/25/2021   MCV 93.2 02/25/2021   PLT 232 02/25/2021   Lab Results  Component Value Date   NA 142 02/25/2021   K 4.2 02/25/2021   CL 102 02/25/2021   CO2 28 02/25/2021    RADIOGRAPHIC STUDIES: I have personally reviewed  the radiological reports and agreed with the findings in the report.  ASSESSMENT AND PLAN:  Ductal carcinoma in situ (DCIS) of left breast 02/17/2021:Screening mammogram detected left breast calcifications lower inner quadrant 0.9 cm: Biopsy revealed high-grade DCIS with necrosis and calcifications, ER 30%, PR 50% weak  Pathology review: I discussed with the patient the difference between DCIS and invasive breast cancer. It is considered a precancerous lesion. DCIS is classified as a 0. It is generally detected through mammograms as calcifications. We discussed the significance of grades and its impact on prognosis. We also discussed the importance of ER and PR receptors and their implications to adjuvant treatment options. Prognosis of  DCIS dependence on grade, comedo necrosis. It is anticipated that if not treated, 20-30% of DCIS can develop into invasive breast cancer.  Recommendation: 1. Breast conserving surgery 2. Followed by adjuvant radiation therapy 3. Followed by antiestrogen therapy with tamoxifen versus anastrozole 5 years (patient had uterine polyps and has an IUD in place to combat that) we may consider using anastrozole instead.  However she does worry about osteoporosis risk.  Return to clinic after surgery to discuss the final pathology report and come up with an adjuvant treatment plan.   All questions were answered. The patient knows to call the clinic with any problems, questions or concerns.   Rulon Eisenmenger, MD, MPH 02/25/2021   I, Reinaldo Raddle, am acting as scribe for Dr. Nicholas Lose, MD.  I have reviewed the above documentation for accuracy and completeness, and I agree with the above.

## 2021-02-25 ENCOUNTER — Ambulatory Visit
Admission: RE | Admit: 2021-02-25 | Discharge: 2021-02-25 | Disposition: A | Discharge status: Home / Self Care | Payer: PPO | Source: Ambulatory Visit | Attending: Radiation Oncology | Admitting: Radiation Oncology

## 2021-02-25 ENCOUNTER — Ambulatory Visit: Payer: PPO | Attending: General Surgery | Admitting: Physical Therapy

## 2021-02-25 ENCOUNTER — Encounter: Payer: Self-pay | Admitting: *Deleted

## 2021-02-25 ENCOUNTER — Ambulatory Visit: Payer: Self-pay | Admitting: General Surgery

## 2021-02-25 ENCOUNTER — Inpatient Hospital Stay (HOSPITAL_BASED_OUTPATIENT_CLINIC_OR_DEPARTMENT_OTHER): Payer: PPO | Admitting: Hematology and Oncology

## 2021-02-25 ENCOUNTER — Inpatient Hospital Stay: Payer: PPO | Attending: Hematology and Oncology

## 2021-02-25 DIAGNOSIS — Z8349 Family history of other endocrine, nutritional and metabolic diseases: Secondary | ICD-10-CM | POA: Insufficient documentation

## 2021-02-25 DIAGNOSIS — Z801 Family history of malignant neoplasm of trachea, bronchus and lung: Secondary | ICD-10-CM | POA: Diagnosis not present

## 2021-02-25 DIAGNOSIS — Z803 Family history of malignant neoplasm of breast: Secondary | ICD-10-CM | POA: Insufficient documentation

## 2021-02-25 DIAGNOSIS — D0512 Intraductal carcinoma in situ of left breast: Secondary | ICD-10-CM | POA: Diagnosis not present

## 2021-02-25 DIAGNOSIS — Z833 Family history of diabetes mellitus: Secondary | ICD-10-CM | POA: Insufficient documentation

## 2021-02-25 DIAGNOSIS — Z79899 Other long term (current) drug therapy: Secondary | ICD-10-CM | POA: Diagnosis not present

## 2021-02-25 DIAGNOSIS — E785 Hyperlipidemia, unspecified: Secondary | ICD-10-CM | POA: Insufficient documentation

## 2021-02-25 DIAGNOSIS — Z170 Estrogen receptor positive status [ER+]: Secondary | ICD-10-CM | POA: Diagnosis not present

## 2021-02-25 DIAGNOSIS — C50312 Malignant neoplasm of lower-inner quadrant of left female breast: Secondary | ICD-10-CM | POA: Diagnosis not present

## 2021-02-25 DIAGNOSIS — Z8249 Family history of ischemic heart disease and other diseases of the circulatory system: Secondary | ICD-10-CM | POA: Insufficient documentation

## 2021-02-25 LAB — CBC WITH DIFFERENTIAL (CANCER CENTER ONLY)
Abs Immature Granulocytes: 0.01 10*3/uL (ref 0.00–0.07)
Basophils Absolute: 0 10*3/uL (ref 0.0–0.1)
Basophils Relative: 0 %
Eosinophils Absolute: 0.1 10*3/uL (ref 0.0–0.5)
Eosinophils Relative: 1 %
HCT: 45.2 % (ref 36.0–46.0)
Hemoglobin: 15 g/dL (ref 12.0–15.0)
Immature Granulocytes: 0 %
Lymphocytes Relative: 25 %
Lymphs Abs: 1.8 10*3/uL (ref 0.7–4.0)
MCH: 30.9 pg (ref 26.0–34.0)
MCHC: 33.2 g/dL (ref 30.0–36.0)
MCV: 93.2 fL (ref 80.0–100.0)
Monocytes Absolute: 0.6 10*3/uL (ref 0.1–1.0)
Monocytes Relative: 8 %
Neutro Abs: 4.6 10*3/uL (ref 1.7–7.7)
Neutrophils Relative %: 66 %
Platelet Count: 232 10*3/uL (ref 150–400)
RBC: 4.85 MIL/uL (ref 3.87–5.11)
RDW: 12.8 % (ref 11.5–15.5)
WBC Count: 7 10*3/uL (ref 4.0–10.5)
nRBC: 0 % (ref 0.0–0.2)

## 2021-02-25 LAB — CMP (CANCER CENTER ONLY)
ALT: 14 U/L (ref 0–44)
AST: 19 U/L (ref 15–41)
Albumin: 3.7 g/dL (ref 3.5–5.0)
Alkaline Phosphatase: 79 U/L (ref 38–126)
Anion gap: 12 (ref 5–15)
BUN: 10 mg/dL (ref 8–23)
CO2: 28 mmol/L (ref 22–32)
Calcium: 9.4 mg/dL (ref 8.9–10.3)
Chloride: 102 mmol/L (ref 98–111)
Creatinine: 0.9 mg/dL (ref 0.44–1.00)
GFR, Estimated: >60 mL/min (ref >60)
Glucose, Bld: 136 mg/dL — ABNORMAL HIGH (ref 70–99)
Potassium: 4.2 mmol/L (ref 3.5–5.1)
Sodium: 142 mmol/L (ref 135–145)
Total Bilirubin: 0.7 mg/dL (ref 0.3–1.2)
Total Protein: 7.4 g/dL (ref 6.5–8.1)

## 2021-02-25 LAB — GENETIC SCREENING ORDER

## 2021-02-25 NOTE — Assessment & Plan Note (Signed)
02/17/2021:Screening mammogram detected left breast calcifications lower inner quadrant 0.9 cm: Biopsy revealed high-grade DCIS with necrosis and calcifications, ER 30%, PR 50% weak  Pathology review: I discussed with the patient the difference between DCIS and invasive breast cancer. It is considered a precancerous lesion. DCIS is classified as a 0. It is generally detected through mammograms as calcifications. We discussed the significance of grades and its impact on prognosis. We also discussed the importance of ER and PR receptors and their implications to adjuvant treatment options. Prognosis of DCIS dependence on grade, comedo necrosis. It is anticipated that if not treated, 20-30% of DCIS can develop into invasive breast cancer.  Recommendation: 1. Breast conserving surgery 2. Followed by adjuvant radiation therapy 3. Followed by antiestrogen therapy with tamoxifen versus anastrozole 5 years (patient had uterine polyps and has an IUD in place to combat that) we may consider using anastrozole instead.  However she does worry about osteoporosis risk.  Return to clinic after surgery to discuss the final pathology report and come up with an adjuvant treatment plan.

## 2021-02-25 NOTE — Progress Notes (Signed)
Radiation Oncology         [phone] ________________________________  Multidisciplinary Breast Oncology Clinic [facility]) Initial Outpatient Consultation  Name: Jessica Livingston MRN: [id number]  Date: 02/25/2021  DOB: [birth year]  [id number], Thayer Jew, MD  Jovita Kussmaul, MD   REFERRING PHYSICIAN: Autumn Messing III, MD  DIAGNOSIS: The encounter diagnosis was Ductal carcinoma in situ (DCIS) of left breast.  Stage 0  Left Breast LIQ, High grade ductal carcinoma in situ, ER+ / PR+ /     ICD-10-CM   1. Ductal carcinoma in situ (DCIS) of left breast  D05.12       HISTORY OF PRESENT ILLNESS::Jessica Livingston is a 80 y.o. female who is presenting to the office today for evaluation of her newly diagnosed breast cancer. She is accompanied by no one ( recent widow). She is doing well overall.   She had routine screening mammography on 01/22/21 showing a possible abnormality in the left breast. She underwent bilateral diagnostic mammography with tomography and left breast ultrasonography at [facility] on 02/04/21 showing: 0.9 cm grouped heterogenous calcifications in the left breast; suspicious for malignancy.  Biopsy on 02/17/21 showed: left breast high grade ductal carcinoma in situ with necrosis and calcifications. Prognostic indicators significant for: estrogen receptor, 30% positive with moderate staining intensity and progesterone receptor, 50% positive with weak staining intensity.   Menarche: 80 years old Age at first live birth: 80 years old GP: 3 LMP: N/A, no longer having period Contraceptive: Yes, for 2 years (dates unknown) HRT: none   The patient was referred today for presentation in the multidisciplinary conference.  Radiology studies and pathology slides were presented there for review and discussion of treatment options.  A consensus was discussed regarding potential next steps.  PREVIOUS RADIATION THERAPY: No  PAST MEDICAL HISTORY:  Past Medical History:  Diagnosis Date   CAD  (coronary artery disease)    Cataract 2018   had sx   Depression    Heart disease '   HLD (hyperlipidemia)    Obesity, unspecified    Other abnormal glucose    Other B-complex deficiencies    Other seborrheic keratosis    Postmenopausal     PAST SURGICAL HISTORY: Past Surgical History:  Procedure Laterality Date   CARDIAC CATHETERIZATION  5/03   mild to mod single vessel disease   CHOLECYSTECTOMY     COLONOSCOPY  2018   HPP/TA   LAPAROSCOPIC GASTRIC BANDING  01/13/2010   NECK SURGERY     POLYPECTOMY  2018   TONSILLECTOMY     TUBAL LIGATION Bilateral     FAMILY HISTORY:  Family History  Problem Relation Age of Onset   Heart attack Father 32   Heart disease Father    Aneurysm Father 31   Breast cancer Mother 58   Thyroid disease Mother 110   Hypertension Mother    Hyperlipidemia Mother    Lung cancer Mother 18   Kidney disease Mother 52   Diabetes Paternal Grandmother    Diabetes Brother    Colon polyps Neg Hx    Colon cancer Neg Hx    Esophageal cancer Neg Hx    Rectal cancer Neg Hx    Stomach cancer Neg Hx     SOCIAL HISTORY:  Social History   Socioeconomic History   Marital status: Married    Spouse name: Not on file   Number of children: 3   Years of education: Not on file   Highest education level: Not on file  Occupational History   Occupation: retired    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  Tobacco Use   Smoking status: Never   Smokeless tobacco: Never  Vaping Use   Vaping Use: Never used  Substance and Sexual Activity   Alcohol use: Yes    Comment: rarely    Drug use: No   Sexual activity: Never  Other Topics Concern   Not on file  Social History Narrative   Married      3 children; 1 son-suicide      Retired      Geologist, 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   Social Determinants of Radio broadcast assistant Strain: Not on file  Food Insecurity: Not on file  Transportation Needs: Not  on file  Physical Activity: Not on file  Stress: Not on file  Social Connections: Not on file    ALLERGIES:  Allergies  Allergen Reactions   Atorvastatin     REACTION: leg pain    MEDICATIONS:  Current Outpatient Medications  Medication Sig Dispense Refill   Besifloxacin HCl (BESIVANCE) 0.6 % SUSP Besivance 0.6 % eye drops,suspension     levonorgestrel (MIRENA, 52 MG,) 20 MCG/24HR IUD Mirena 20 mcg/24 hours (6 yrs) 52 mg intrauterine device  Take 1 device by intrauterine route.     trimethoprim-polymyxin b (POLYTRIM) ophthalmic solution INSTILL ONE DROP INTO RIGHT EYE 4 TIMES A DAY FOR 2 DAYS AFTER EACH MONTHLY EYE INJECTION     Current Facility-Administered Medications  Medication Dose Route Frequency Provider Last Rate Last Admin   0.9 %  sodium chloride infusion  500 mL Intravenous Once Armbruster, [name], MD        REVIEW OF SYSTEMS: A 10+ POINT REVIEW OF SYSTEMS WAS OBTAINED including neurology, dermatology, psychiatry, cardiac, respiratory, lymph, extremities, GI, GU, musculoskeletal, constitutional, reproductive, HEENT. On the provided form, she reports wearing glasses and changes in vision. She denies any other symptoms.    PHYSICAL EXAM:    Vitals with BMI 02/25/2021  Height 5\' 2"   Weight 213 lbs 5 oz  BMI 39  Systolic 947  Diastolic 61  Pulse 64    Lungs are clear to auscultation bilaterally. Heart has regular rate and rhythm. No palpable cervical, supraclavicular, or axillary adenopathy. Abdomen soft, non-tender, normal bowel sounds. Breast: Right breast with no palpable mass, nipple discharge, or bleeding. Left breast with mild bruising in the inferior aspect of the breast adjacent to the nipple areolar complex. no Mass appreciated, no nipple discharge or bleeding .   KPS = 100  100 - Normal; no complaints; no evidence of disease. 90   - Able to carry on normal activity; minor signs or symptoms of disease. 80   - Normal activity with effort; some signs or  symptoms of disease. 55   - Cares for self; unable to carry on normal activity or to do active work. 60   - Requires occasional assistance, but is able to care for most of his personal needs. 50   - Requires considerable assistance and frequent medical care. 64   - Disabled; requires special care and assistance. 53   - Severely disabled; hospital admission is indicated although death not imminent. 85   - Very sick; hospital admission necessary; active supportive treatment necessary. 10   - Moribund; fatal processes progressing rapidly. 0     - Dead  Karnofsky DA, Abelmann WH, Craver LS and  Burchenal JH [phone]) The use of the nitrogen mustards in the palliative treatment of carcinoma: with particular reference to bronchogenic carcinoma Cancer 1 634-56  LABORATORY DATA:  Lab Results  Component Value Date   WBC 7.0 02/25/2021   HGB 15.0 02/25/2021   HCT 45.2 02/25/2021   MCV 93.2 02/25/2021   PLT 232 02/25/2021   Lab Results  Component Value Date   NA 142 02/25/2021   K 4.2 02/25/2021   CL 102 02/25/2021   CO2 28 02/25/2021   Lab Results  Component Value Date   ALT 14 02/25/2021   AST 19 02/25/2021   ALKPHOS 79 02/25/2021   BILITOT 0.7 02/25/2021    PULMONARY FUNCTION TEST:   Recent Review Flowsheet Data   There is no flowsheet data to display.     RADIOGRAPHY: No results found.    IMPRESSION: Stage 0  Left Breast LIQ, High grade ductal carcinoma in situ, ER+ / PR+ /    Patient will be a good candidate for breast conservation with radiotherapy to left breast. We discussed the general course of radiation, potential side effects, and toxicities with radiation and the patient is interested in this approach.   In light of the high grade nature of her DCIS, I would recommend adjuvant radiation therapy, despite her advanced age.    PLAN:  Left lumpectomy  Radiation therapy  AI   ------------------------------------------------  Blair Promise, PhD, MD  This document  serves as a record of services personally performed by Gery Pray, MD. It was created on his behalf by Roney Mans, a trained medical scribe. The creation of this record is based on the scribe's personal observations and the provider's statements to them. This document has been checked and approved by the attending provider.

## 2021-02-27 ENCOUNTER — Encounter: Payer: Self-pay | Admitting: *Deleted

## 2021-02-27 ENCOUNTER — Encounter: Payer: Self-pay | Admitting: General Practice

## 2021-02-27 ENCOUNTER — Telehealth: Payer: Self-pay | Admitting: *Deleted

## 2021-02-27 NOTE — Telephone Encounter (Signed)
Left message for a return phone call to follow up from [facility] 6/29

## 2021-02-27 NOTE — Progress Notes (Signed)
[facility] Psychosocial Distress Screening Spiritual Care  Met with Jessica Livingston by phone following Breast Multidisciplinary Clinic to introduce [facility] team/resources, reviewing distress screen per protocol.  The patient scored a  [unspecified]  on the Psychosocial Distress Thermometer which indicates  [unspecified]  distress. Also assessed for distress and other psychosocial needs.   ONCBCN DISTRESS SCREENING 02/27/2021  Screening Type Initial Screening  Emotional problem type Depression;Nervousness/Anxiety;Isolation/feeling alone;Feeling hopeless  Referral to support programs Yes   Jessica Livingston reports feeling "better when I left than when I arrived" and reports no other needs or concerns at this time.   Follow up needed: No. Jessica Livingston knows to reach out as needed/desired to chaplain/team.   Suzette Battiest, [place], [facility] Pager [phone] Voicemail [phone]

## 2021-03-09 ENCOUNTER — Encounter: Payer: Self-pay | Admitting: *Deleted

## 2021-03-10 ENCOUNTER — Telehealth: Payer: Self-pay | Admitting: Hematology and Oncology

## 2021-03-10 ENCOUNTER — Encounter: Payer: Self-pay | Admitting: *Deleted

## 2021-03-10 DIAGNOSIS — D0512 Intraductal carcinoma in situ of left breast: Secondary | ICD-10-CM

## 2021-03-10 NOTE — Telephone Encounter (Signed)
Scheduled appointment per 07/12 sch msg. Left message 

## 2021-03-11 DIAGNOSIS — N8501 Benign endometrial hyperplasia: Secondary | ICD-10-CM | POA: Diagnosis not present

## 2021-03-11 DIAGNOSIS — Z30432 Encounter for removal of intrauterine contraceptive device: Secondary | ICD-10-CM | POA: Diagnosis not present

## 2021-03-11 DIAGNOSIS — C50112 Malignant neoplasm of central portion of left female breast: Secondary | ICD-10-CM | POA: Diagnosis not present

## 2021-03-13 ENCOUNTER — Encounter: Payer: Self-pay | Admitting: *Deleted

## 2021-03-17 ENCOUNTER — Inpatient Hospital Stay
Admission: RE | Admit: 2021-03-17 | Discharge: 2021-03-17 | Disposition: A | Discharge status: Home / Self Care | Payer: Self-pay | Source: Ambulatory Visit | Attending: Radiation Oncology | Admitting: Radiation Oncology

## 2021-03-17 ENCOUNTER — Other Ambulatory Visit: Payer: Self-pay | Admitting: Radiation Oncology

## 2021-03-17 DIAGNOSIS — D0512 Intraductal carcinoma in situ of left breast: Secondary | ICD-10-CM

## 2021-03-25 ENCOUNTER — Other Ambulatory Visit: Payer: Self-pay

## 2021-03-25 ENCOUNTER — Encounter (INDEPENDENT_AMBULATORY_CARE_PROVIDER_SITE_OTHER): Payer: PPO | Admitting: Ophthalmology

## 2021-03-25 DIAGNOSIS — H43813 Vitreous degeneration, bilateral: Secondary | ICD-10-CM

## 2021-03-25 DIAGNOSIS — H353231 Exudative age-related macular degeneration, bilateral, with active choroidal neovascularization: Secondary | ICD-10-CM | POA: Diagnosis not present

## 2021-03-31 DIAGNOSIS — L570 Actinic keratosis: Secondary | ICD-10-CM | POA: Diagnosis not present

## 2021-03-31 DIAGNOSIS — X32XXXD Exposure to sunlight, subsequent encounter: Secondary | ICD-10-CM | POA: Diagnosis not present

## 2021-04-07 NOTE — Progress Notes (Signed)
Surgical Instructions    Your procedure is scheduled on Thursday August 18.  Report to [facility] Main Entrance "A" at 6:30 A.M., then check in with the Admitting office.  Call this number if you have problems the morning of surgery:  [phone]   If you have any questions prior to your surgery date call [phone]: Open Monday-Friday 8am-4pm    Remember:  Do not eat after midnight the night before your surgery  You may drink clear liquids until 5:30am the morning of your surgery.   Clear liquids allowed are: Water, Non-Citrus Juices (without pulp), Carbonated Beverages, Clear Tea, Black Coffee Only, and Gatorade    Take these medicines the morning of surgery with A SIP OF WATER :            NONE  As of today, STOP taking any Aspirin (unless otherwise instructed by your surgeon) Aleve, Naproxen, Ibuprofen, Motrin, Advil, Goody's, BC's, all herbal medications, fish oil, and all vitamins.          Do not wear jewelry or makeup Do not wear lotions, powders, perfumes/colognes, or deodorant. Do not shave 48 hours prior to surgery.  Men may shave face and neck. Do not bring valuables to the hospital. DO Not wear nail polish, gel polish, artificial nails, or any other type of covering on  natural nails including finger and toenails. If patients have artificial nails, gel coating, etc. that need to be removed by a nail salon please have this removed prior to surgery or surgery may need to be canceled/delayed if the surgeon/ anesthesia feels like the patient is unable to be adequately monitored.             [facility] is not responsible for any belongings or valuables.  Do NOT Smoke (Tobacco/Vaping) or drink Alcohol 24 hours prior to your procedure If you use a CPAP at night, you may bring all equipment for your overnight stay.   Contacts, glasses, dentures or bridgework may not be worn into surgery, please bring cases for these belongings   For patients admitted to the hospital,  discharge time will be determined by your treatment team.   Patients discharged the day of surgery will not be allowed to drive home, and someone needs to stay with them for 24 hours.  ONLY 1 SUPPORT PERSON MAY BE PRESENT WHILE YOU ARE IN SURGERY. IF YOU ARE TO BE ADMITTED ONCE YOU ARE IN YOUR ROOM YOU WILL BE ALLOWED TWO (2) VISITORS.  Minor children may have two parents present. Special consideration for safety and communication needs will be reviewed on a case by case basis.  Special instructions:    Oral Hygiene is also important to reduce your risk of infection.  Remember - BRUSH YOUR TEETH THE MORNING OF SURGERY WITH YOUR REGULAR TOOTHPASTE   [facility]- Preparing For Surgery  Before surgery, you can play an important role. Because skin is not sterile, your skin needs to be as free of germs as possible. You can reduce the number of germs on your skin by washing with CHG (chlorahexidine gluconate) Soap before surgery.  CHG is an antiseptic cleaner which kills germs and bonds with the skin to continue killing germs even after washing.     Please do not use if you have an allergy to CHG or antibacterial soaps. If your skin becomes reddened/irritated stop using the CHG.  Do not shave (including legs and underarms) for at least 48 hours prior to first CHG shower. It is  OK to shave your face.  Please follow these instructions carefully.     Shower the NIGHT BEFORE SURGERY and the MORNING OF SURGERY with CHG Soap.   If you chose to wash your hair, wash your hair first as usual with your normal shampoo. After you shampoo, rinse your hair and body thoroughly to remove the shampoo.  Then [employer] and genitals (private parts) with your normal soap and rinse thoroughly to remove soap.  After that Use CHG Soap as you would any other liquid soap. You can apply CHG directly to the skin and wash gently with a scrungie or a clean washcloth.   Apply the CHG Soap to your body ONLY FROM THE NECK DOWN.   Do not use on open wounds or open sores. Avoid contact with your eyes, ears, mouth and genitals (private parts). Wash Face and genitals (private parts)  with your normal soap.   Wash thoroughly, paying special attention to the area where your surgery will be performed.  Thoroughly rinse your body with warm water from the neck down.  DO NOT shower/wash with your normal soap after using and rinsing off the CHG Soap.  Pat yourself dry with a CLEAN TOWEL.  Wear CLEAN PAJAMAS to bed the night before surgery  Place CLEAN SHEETS on your bed the night before your surgery  DO NOT SLEEP WITH PETS.   Day of Surgery:  Take a shower with CHG soap. Wear Clean/Comfortable clothing the morning of surgery Do not apply any deodorants/lotions.   Remember to brush your teeth WITH YOUR REGULAR TOOTHPASTE.   Please read over the following fact sheets that you were given.

## 2021-04-08 ENCOUNTER — Encounter (HOSPITAL_COMMUNITY): Payer: Self-pay

## 2021-04-08 ENCOUNTER — Other Ambulatory Visit: Payer: Self-pay

## 2021-04-08 ENCOUNTER — Encounter (HOSPITAL_COMMUNITY)
Admission: RE | Admit: 2021-04-08 | Discharge: 2021-04-08 | Disposition: A | Discharge status: Home / Self Care | Payer: PPO | Source: Ambulatory Visit | Attending: General Surgery | Admitting: General Surgery

## 2021-04-08 DIAGNOSIS — Z01818 Encounter for other preprocedural examination: Secondary | ICD-10-CM | POA: Diagnosis not present

## 2021-04-08 HISTORY — DX: Malignant (primary) neoplasm, unspecified: C80.1

## 2021-04-08 LAB — BASIC METABOLIC PANEL
Anion gap: 7 (ref 5–15)
BUN: 9 mg/dL (ref 8–23)
CO2: 29 mmol/L (ref 22–32)
Calcium: 9.1 mg/dL (ref 8.9–10.3)
Chloride: 103 mmol/L (ref 98–111)
Creatinine, Ser: 0.82 mg/dL (ref 0.44–1.00)
GFR, Estimated: >60 mL/min (ref >60)
Glucose, Bld: 112 mg/dL — ABNORMAL HIGH (ref 70–99)
Potassium: 3.8 mmol/L (ref 3.5–5.1)
Sodium: 139 mmol/L (ref 135–145)

## 2021-04-08 LAB — CBC
HCT: 47.2 % — ABNORMAL HIGH (ref 36.0–46.0)
Hemoglobin: 15.4 g/dL — ABNORMAL HIGH (ref 12.0–15.0)
MCH: 30.8 pg (ref 26.0–34.0)
MCHC: 32.6 g/dL (ref 30.0–36.0)
MCV: 94.4 fL (ref 80.0–100.0)
Platelets: 220 10*3/uL (ref 150–400)
RBC: 5 MIL/uL (ref 3.87–5.11)
RDW: 13.1 % (ref 11.5–15.5)
WBC: 6.3 10*3/uL (ref 4.0–10.5)
nRBC: 0 % (ref 0.0–0.2)

## 2021-04-08 NOTE — Progress Notes (Addendum)
PCP: Dr. Deland Pretty, MD Cardiologist: Dr. Chrissie Noa, not seen since 2014.  MD is retired.   EKG: 04/08/21 CXR: 09/19/09 ECHO: denies Stress Test: 03/07/13 Cardiac Cath: 12/2001  Fasting Blood Sugar- na Checks Blood Sugar__na_ times a day  OSA/CPAP: NO  ASA/Blood Thinner: No  Covid test not needed  Anesthesia: Yes, history lists CAD.  Patient denies this. Abnormal EKG  Patient denies shortness of breath, fever, cough, and chest pain at PAT appointment.  Patient verbalized understanding of instructions provided today at the PAT appointment.  Patient asked to review instructions at home and day of surgery.

## 2021-04-09 NOTE — Anesthesia Preprocedure Evaluation (Addendum)
Anesthesia Evaluation  Patient identified by MRN, date of birth, ID band Patient awake    Reviewed: Allergy & Precautions, NPO status , Patient's Chart, lab work & pertinent test results  History of Anesthesia Complications Negative for: history of anesthetic complications  Airway Mallampati: II  TM Distance: >3 FB Neck ROM: Full    Dental  (+) Dental Advisory Given, Teeth Intact   Pulmonary neg pulmonary ROS,    Pulmonary exam normal        Cardiovascular + CAD  Normal cardiovascular exam     Neuro/Psych PSYCHIATRIC DISORDERS Depression negative neurological ROS     GI/Hepatic Neg liver ROS,  S/p gastric banding    Endo/Other   Obesity   Renal/GU negative Renal ROS     Musculoskeletal negative musculoskeletal ROS (+)   Abdominal   Peds  Hematology negative hematology ROS (+)   Anesthesia Other Findings   Reproductive/Obstetrics  Breast cancer                             Anesthesia Physical Anesthesia Plan  ASA: 3  Anesthesia Plan: General   Post-op Pain Management:    Induction: Intravenous  PONV Risk Score and Plan: 3 and Treatment may vary due to age or medical condition, Ondansetron, Aprepitant and Propofol infusion  Airway Management Planned: LMA  Additional Equipment: None  Intra-op Plan:   Post-operative Plan: Extubation in OR  Informed Consent: I have reviewed the patients History and Physical, chart, labs and discussed the procedure including the risks, benefits and alternatives for the proposed anesthesia with the patient or authorized representative who has indicated his/her understanding and acceptance.     Dental advisory given  Plan Discussed with: CRNA and Anesthesiologist  Anesthesia Plan Comments:       Anesthesia Quick Evaluation

## 2021-04-09 NOTE — Progress Notes (Signed)
Anesthesia Chart Review:  Case: [id number] Date/Time: 04/16/21 0815   Procedure: LEFT BREAST LUMPECTOMY WITH RADIOACTIVE SEED LOCALIZATION (Left: Breast)   Anesthesia type: General   Pre-op diagnosis: LEFT BREAST DCIS   Location: [place] OR ROOM 09 / [place] [facility]   Surgeons: Jovita Kussmaul, MD     RSL at [facility] 04/15/21 at 8:15 AM  DISCUSSION: Patient is an 80 year old female scheduled for the above procedure.  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   CAD is listed in her history, although she could not recall much about it--just that she has never been told there was any significant concerns and that she has not had any recent issues. Specifically, she denied chest pain, orthopnea, SOB at rest, palpitations, dizziness, syncope. She will get occasional mild ankle edema, but no current/new issue. She has stable DOE with activities such as walking fast or going up a flight of stairs. She is able to clean her house and vacuum. She also mows her own yard, but it is a riding Therapist, art. She reports she had been the primary caregiver of her husband for six years after he had a stroke. He died in 12/25/22, and she is still struggling with this. She did not recall the findings of her 2003 cath (and report is not viewable in [facility]), but prior to her 2011 lap band surgery, she had written on her intake form that it showed mild 1V CAD. She said she was seeing Dr. Romeo Apple at the time, but he retired probably > 10 years ago. When she first started seeing PCP Dr. Shelia Media in 2014, he ordered a ETT because she reported dizziness with heavy lifting and had family history of CAD. Per the report, it was moderate risk, primarily due to extreme dyspnea, and clinical correlation recommended to determine if additional testing was needed. She reported that Dr. Shelia Media did not  refer her to cardiology or for additional testing. (01/12/17 note by Dr. Shelia Media indicates, "ETT, neg, 2014"). Her 04/08/21 EKG showed SR with sinus arrhythmia, low QRS voltage, inferior infarct (age undetermined) which is similar to 09/19/09 tracing.   Preoperative labs acceptable for OR. Cr 0.82, glucose 112, H/H 15.4/47.2, PLT 220.  Patient with reported mild 1V CAD ~ 20 years ago. Last ETT in 2014 and was not referred for any additional testing per primary care whom she continues to see, last 10/01/20 for general medical exam (limited records available for review in Care Everywhere). She denied CV symptoms and feels DOE associated with moderate activity is stable "for some time". She is able to clean her home and vacuum without CV symptoms. Her EKG is stable. Discussed with anesthesiologist Hoy Morn, MD. If no acute changes then it is anticipated that she can proceed with breast lumpectomy as planned. Assigned anesthesiologist to evaluate on the day of surgery.    VS: BP (!) 156/66   Pulse (!) 59   Temp 36.8 C (Oral)   Resp 17   Ht '5\' 3"'$  (1.6 m)   Wt 94.9 kg   SpO2 96%   BMI 37.08 kg/m    PROVIDERS: Deland Pretty, MD is PCP, established since 02/14/13, last visit 10/21/20. [facility]) Nicholas Lose, MD is HEM-ONC Gery Pray, MD is RAD-ONC   LABS: Labs reviewed: Acceptable for surgery. (all labs ordered are listed, but only abnormal results are displayed)  Labs  Reviewed  BASIC METABOLIC PANEL - Abnormal; Notable for the following components:      Result Value   Glucose, Bld 112 (*)    All other components within normal limits  CBC - Abnormal; Notable for the following components:   Hemoglobin 15.4 (*)    HCT 47.2 (*)    All other components within normal limits    EKG: EKG 04/08/21:  Normal sinus rhythm with sinus arrhythmia Low voltage QRS Inferior infarct , age undetermined Abnormal ECG No significant change since last tracing [09/19/09]  Confirmed  by Lyman Bishop [phone]) on 04/08/2021 5:33:09 PM   CV: ETT 03/07/13 (ordered by Dr. Shelia Media for dizziness on heavy lifting) IMPRESSION: Borderline normal Exercise tolerance with Hypertensive BP response Reached near 100% max predicted HR ST depression with low voltage ECG noted. If Extreme Dyspnea is considered an Anginal Equivalent: Duke TM Score is -6.5 (if not, the score is 1.5)-- both considered MODERATE Risk Recommendation: Clinical correlation required. If symptoms are concerning, consider evaluation for possible Imaging Stress Testing vs. Invasive Evaluation via coronary angiography. - She had follow-up/clinical correlation with Dr. Shelia Media who reported did not refer her for any additional testing. 01/12/17 note by Dr. Shelia Media indicates, "ETT, neg, 2014".   Past Medical History:  Diagnosis Date   CAD (coronary artery disease)    Cancer ([place])    Cataract 2018   had sx   Depression    Heart disease '   HLD (hyperlipidemia)    Obesity, unspecified    Other abnormal glucose    Other B-complex deficiencies    Other seborrheic keratosis    Postmenopausal     Past Surgical History:  Procedure Laterality Date   CARDIAC CATHETERIZATION  5/03   mild to mod single vessel disease   CHOLECYSTECTOMY     COLONOSCOPY  2018   HPP/TA   LAPAROSCOPIC GASTRIC BANDING  01/13/2010   NECK SURGERY     POLYPECTOMY  2018   TONSILLECTOMY     TUBAL LIGATION Bilateral     MEDICATIONS:  Besifloxacin HCl (BESIVANCE) 0.6 % SUSP   Cyanocobalamin (VITAMIN B-12 PO)   VITAMIN D PO    0.9 %  sodium chloride infusion    Myra Gianotti, PA-C Surgical Short Stay/Anesthesiology [facility] Phone [phone] [facility] Phone [phone] 04/09/2021 5:58 PM

## 2021-04-15 DIAGNOSIS — C50312 Malignant neoplasm of lower-inner quadrant of left female breast: Secondary | ICD-10-CM | POA: Diagnosis not present

## 2021-04-16 ENCOUNTER — Ambulatory Visit (HOSPITAL_COMMUNITY): Payer: PPO | Admitting: Anesthesiology

## 2021-04-16 ENCOUNTER — Ambulatory Visit (HOSPITAL_COMMUNITY)
Admission: RE | Admit: 2021-04-16 | Discharge: 2021-04-16 | Disposition: A | Discharge status: Home / Self Care | Payer: PPO | Attending: General Surgery | Admitting: General Surgery

## 2021-04-16 ENCOUNTER — Encounter (HOSPITAL_COMMUNITY): Payer: Self-pay | Admitting: General Surgery

## 2021-04-16 ENCOUNTER — Encounter (HOSPITAL_COMMUNITY)
Admission: RE | Disposition: A | Discharge status: Home / Self Care | Payer: Self-pay | Source: Home / Self Care | Attending: General Surgery

## 2021-04-16 ENCOUNTER — Ambulatory Visit (HOSPITAL_COMMUNITY): Payer: PPO | Admitting: Vascular Surgery

## 2021-04-16 ENCOUNTER — Other Ambulatory Visit: Payer: Self-pay

## 2021-04-16 DIAGNOSIS — N6032 Fibrosclerosis of left breast: Secondary | ICD-10-CM | POA: Diagnosis not present

## 2021-04-16 DIAGNOSIS — I2510 Atherosclerotic heart disease of native coronary artery without angina pectoris: Secondary | ICD-10-CM | POA: Diagnosis not present

## 2021-04-16 DIAGNOSIS — Z9884 Bariatric surgery status: Secondary | ICD-10-CM | POA: Insufficient documentation

## 2021-04-16 DIAGNOSIS — E7800 Pure hypercholesterolemia, unspecified: Secondary | ICD-10-CM | POA: Insufficient documentation

## 2021-04-16 DIAGNOSIS — C50312 Malignant neoplasm of lower-inner quadrant of left female breast: Secondary | ICD-10-CM | POA: Diagnosis not present

## 2021-04-16 DIAGNOSIS — Z8719 Personal history of other diseases of the digestive system: Secondary | ICD-10-CM | POA: Insufficient documentation

## 2021-04-16 DIAGNOSIS — C50912 Malignant neoplasm of unspecified site of left female breast: Secondary | ICD-10-CM | POA: Diagnosis not present

## 2021-04-16 DIAGNOSIS — N6459 Other signs and symptoms in breast: Secondary | ICD-10-CM | POA: Diagnosis not present

## 2021-04-16 DIAGNOSIS — Z803 Family history of malignant neoplasm of breast: Secondary | ICD-10-CM | POA: Insufficient documentation

## 2021-04-16 DIAGNOSIS — N62 Hypertrophy of breast: Secondary | ICD-10-CM | POA: Insufficient documentation

## 2021-04-16 DIAGNOSIS — E785 Hyperlipidemia, unspecified: Secondary | ICD-10-CM | POA: Diagnosis not present

## 2021-04-16 DIAGNOSIS — N641 Fat necrosis of breast: Secondary | ICD-10-CM | POA: Diagnosis not present

## 2021-04-16 DIAGNOSIS — D0512 Intraductal carcinoma in situ of left breast: Secondary | ICD-10-CM | POA: Insufficient documentation

## 2021-04-16 DIAGNOSIS — Z809 Family history of malignant neoplasm, unspecified: Secondary | ICD-10-CM | POA: Insufficient documentation

## 2021-04-16 DIAGNOSIS — F32A Depression, unspecified: Secondary | ICD-10-CM | POA: Diagnosis not present

## 2021-04-16 HISTORY — PX: BREAST LUMPECTOMY WITH RADIOACTIVE SEED LOCALIZATION: SHX6424

## 2021-04-16 SURGERY — BREAST LUMPECTOMY WITH RADIOACTIVE SEED LOCALIZATION
Anesthesia: General | Site: Breast | Laterality: Left

## 2021-04-16 MED ORDER — PHENYLEPHRINE 40 MCG/ML (10ML) SYRINGE FOR IV PUSH (FOR BLOOD PRESSURE SUPPORT)
PREFILLED_SYRINGE | INTRAVENOUS | Status: AC
  Filled 2021-04-16: qty 10

## 2021-04-16 MED ORDER — ONDANSETRON HCL 4 MG/2ML IJ SOLN
INTRAMUSCULAR | Status: AC
  Filled 2021-04-16: qty 2

## 2021-04-16 MED ORDER — FENTANYL CITRATE (PF) 250 MCG/5ML IJ SOLN
INTRAMUSCULAR | Status: DC | PRN
  Administered 2021-04-16: 25 ug via INTRAVENOUS

## 2021-04-16 MED ORDER — SUCCINYLCHOLINE CHLORIDE 200 MG/10ML IV SOSY
PREFILLED_SYRINGE | INTRAVENOUS | Status: AC
  Filled 2021-04-16: qty 10

## 2021-04-16 MED ORDER — BUPIVACAINE-EPINEPHRINE 0.25% -1:200000 IJ SOLN
INTRAMUSCULAR | Status: DC | PRN
  Administered 2021-04-16: 30 mL

## 2021-04-16 MED ORDER — PROPOFOL 10 MG/ML IV BOLUS
INTRAVENOUS | Status: AC
  Filled 2021-04-16: qty 40

## 2021-04-16 MED ORDER — HYDROCODONE-ACETAMINOPHEN 5-325 MG PO TABS: 1.0000 | ORAL_TABLET | Freq: Four times a day (QID) | ORAL | 0 refills | Status: DC | PRN

## 2021-04-16 MED ORDER — 0.9 % SODIUM CHLORIDE (POUR BTL) OPTIME
TOPICAL | Status: DC | PRN
  Administered 2021-04-16: 1000 mL

## 2021-04-16 MED ORDER — CELECOXIB 200 MG PO CAPS
200.0000 mg | ORAL_CAPSULE | ORAL | Status: AC
  Administered 2021-04-16: 200 mg via ORAL
  Filled 2021-04-16: qty 1

## 2021-04-16 MED ORDER — PROPOFOL 10 MG/ML IV BOLUS
INTRAVENOUS | Status: DC | PRN
  Administered 2021-04-16: 140 mg via INTRAVENOUS

## 2021-04-16 MED ORDER — LIDOCAINE 2% (20 MG/ML) 5 ML SYRINGE
INTRAMUSCULAR | Status: AC
  Filled 2021-04-16: qty 5

## 2021-04-16 MED ORDER — DEXAMETHASONE SODIUM PHOSPHATE 10 MG/ML IJ SOLN
INTRAMUSCULAR | Status: AC
  Filled 2021-04-16: qty 1

## 2021-04-16 MED ORDER — ONDANSETRON HCL 4 MG/2ML IJ SOLN
INTRAMUSCULAR | Status: DC | PRN
Start: 2021-04-16 — End: 2021-04-16
  Administered 2021-04-16: 4 mg via INTRAVENOUS

## 2021-04-16 MED ORDER — BUPIVACAINE-EPINEPHRINE (PF) 0.25% -1:200000 IJ SOLN
INTRAMUSCULAR | Status: AC
  Filled 2021-04-16: qty 30

## 2021-04-16 MED ORDER — CEFAZOLIN SODIUM-DEXTROSE 2-4 GM/100ML-% IV SOLN
2.0000 g | INTRAVENOUS | Status: AC
  Administered 2021-04-16: 2 g via INTRAVENOUS
  Filled 2021-04-16: qty 100

## 2021-04-16 MED ORDER — OXYCODONE HCL 5 MG/5ML PO SOLN
5.0000 mg | Freq: Once | ORAL | Status: DC | PRN
Start: 2021-04-16 — End: 2021-04-16

## 2021-04-16 MED ORDER — ORAL CARE MOUTH RINSE: 15.0000 mL | Freq: Once | OROMUCOSAL | Status: AC

## 2021-04-16 MED ORDER — CHLORHEXIDINE GLUCONATE CLOTH 2 % EX PADS: 6.0000 | MEDICATED_PAD | Freq: Once | CUTANEOUS | Status: DC

## 2021-04-16 MED ORDER — LIDOCAINE 2% (20 MG/ML) 5 ML SYRINGE
INTRAMUSCULAR | Status: DC | PRN
  Administered 2021-04-16: 60 mg via INTRAVENOUS

## 2021-04-16 MED ORDER — MIDAZOLAM HCL 2 MG/2ML IJ SOLN
INTRAMUSCULAR | Status: AC
  Filled 2021-04-16: qty 2

## 2021-04-16 MED ORDER — ROCURONIUM BROMIDE 10 MG/ML (PF) SYRINGE
PREFILLED_SYRINGE | INTRAVENOUS | Status: AC
  Filled 2021-04-16: qty 10

## 2021-04-16 MED ORDER — FENTANYL CITRATE (PF) 250 MCG/5ML IJ SOLN
INTRAMUSCULAR | Status: AC
  Filled 2021-04-16: qty 5

## 2021-04-16 MED ORDER — OXYCODONE HCL 5 MG PO TABS: 5.0000 mg | ORAL_TABLET | Freq: Once | ORAL | Status: DC | PRN

## 2021-04-16 MED ORDER — GABAPENTIN 300 MG PO CAPS
300.0000 mg | ORAL_CAPSULE | ORAL | Status: AC
  Administered 2021-04-16: 300 mg via ORAL
  Filled 2021-04-16: qty 1

## 2021-04-16 MED ORDER — DEXAMETHASONE SODIUM PHOSPHATE 10 MG/ML IJ SOLN
INTRAMUSCULAR | Status: DC | PRN
  Administered 2021-04-16: 4 mg via INTRAVENOUS

## 2021-04-16 MED ORDER — LACTATED RINGERS IV SOLN: INTRAVENOUS | Status: DC

## 2021-04-16 MED ORDER — ONDANSETRON HCL 4 MG/2ML IJ SOLN: 4.0000 mg | Freq: Once | INTRAMUSCULAR | Status: DC | PRN

## 2021-04-16 MED ORDER — EPHEDRINE SULFATE-NACL 50-0.9 MG/10ML-% IV SOSY
PREFILLED_SYRINGE | INTRAVENOUS | Status: DC | PRN
  Administered 2021-04-16 (×3): 5 mg via INTRAVENOUS

## 2021-04-16 MED ORDER — ACETAMINOPHEN 500 MG PO TABS
1000.0000 mg | ORAL_TABLET | ORAL | Status: AC
  Administered 2021-04-16: 1000 mg via ORAL
  Filled 2021-04-16: qty 2

## 2021-04-16 MED ORDER — FENTANYL CITRATE (PF) 100 MCG/2ML IJ SOLN: 25.0000 ug | INTRAMUSCULAR | Status: DC | PRN

## 2021-04-16 MED ORDER — CHLORHEXIDINE GLUCONATE 0.12 % MT SOLN
15.0000 mL | Freq: Once | OROMUCOSAL | Status: AC
  Administered 2021-04-16: 15 mL via OROMUCOSAL
  Filled 2021-04-16: qty 15

## 2021-04-16 SURGICAL SUPPLY — 32 items
ADH SKN CLS APL DERMABOND .7 (GAUZE/BANDAGES/DRESSINGS) ×1
APL PRP STRL LF DISP 70% ISPRP (MISCELLANEOUS) ×1
APPLIER CLIP 9.375 MED OPEN (MISCELLANEOUS) ×2
APR CLP MED 9.3 20 MLT OPN (MISCELLANEOUS) ×1
BAG COUNTER SPONGE SURGICOUNT (BAG) IMPLANT
BAG SPNG CNTER NS LX DISP (BAG)
CANISTER SUCT 3000ML PPV (MISCELLANEOUS) ×2 IMPLANT
CHLORAPREP W/TINT 26 (MISCELLANEOUS) ×2 IMPLANT
CLIP APPLIE 9.375 MED OPEN (MISCELLANEOUS) IMPLANT
COVER PROBE W GEL 5X96 (DRAPES) ×2 IMPLANT
COVER SURGICAL LIGHT HANDLE (MISCELLANEOUS) ×2 IMPLANT
DERMABOND ADVANCED (GAUZE/BANDAGES/DRESSINGS) ×1
DERMABOND ADVANCED .7 DNX12 (GAUZE/BANDAGES/DRESSINGS) ×1 IMPLANT
DEVICE DUBIN SPECIMEN MAMMOGRA (MISCELLANEOUS) ×2 IMPLANT
DRAPE CHEST BREAST 15X10 FENES (DRAPES) ×2 IMPLANT
ELECT COATED BLADE 2.86 ST (ELECTRODE) ×2 IMPLANT
ELECT REM PT RETURN 9FT ADLT (ELECTROSURGICAL) ×2
ELECTRODE REM PT RTRN 9FT ADLT (ELECTROSURGICAL) ×1 IMPLANT
GLOVE SURG ENC MOIS LTX SZ7.5 (GLOVE) ×4 IMPLANT
GOWN STRL REUS W/ TWL LRG LVL3 (GOWN DISPOSABLE) ×2 IMPLANT
GOWN STRL REUS W/TWL LRG LVL3 (GOWN DISPOSABLE) ×4
KIT BASIN OR (CUSTOM PROCEDURE TRAY) ×2 IMPLANT
KIT MARKER MARGIN INK (KITS) ×2 IMPLANT
NDL HYPO 25GX1X1/2 BEV (NEEDLE) ×1 IMPLANT
NEEDLE HYPO 25GX1X1/2 BEV (NEEDLE) ×2 IMPLANT
NS IRRIG 1000ML POUR BTL (IV SOLUTION) ×2 IMPLANT
PACK GENERAL/GYN (CUSTOM PROCEDURE TRAY) ×2 IMPLANT
SUT MNCRL AB 4-0 PS2 18 (SUTURE) ×2 IMPLANT
SUT VIC AB 3-0 SH 18 (SUTURE) ×2 IMPLANT
SYR CONTROL 10ML LL (SYRINGE) ×2 IMPLANT
TOWEL GREEN STERILE (TOWEL DISPOSABLE) ×2 IMPLANT
TOWEL GREEN STERILE FF (TOWEL DISPOSABLE) ×2 IMPLANT

## 2021-04-16 NOTE — Interval H&P Note (Signed)
History and Physical Interval Note:  04/16/2021 8:13 AM  Jessica Livingston  has presented today for surgery, with the diagnosis of LEFT BREAST DCIS.  The various methods of treatment have been discussed with the patient and family. After consideration of risks, benefits and other options for treatment, the patient has consented to  Procedure(s): LEFT BREAST LUMPECTOMY WITH RADIOACTIVE SEED LOCALIZATION (Left) as a surgical intervention.  The patient's history has been reviewed, patient examined, no change in status, stable for surgery.  I have reviewed the patient's chart and labs.  Questions were answered to the patient's satisfaction.     Autumn Messing III

## 2021-04-16 NOTE — H&P (Signed)
Jessica Livingston  Location: [facility] Patient #: [id number] DOB: 1941/06/15 Married / Language: English / Race: White Female   History of Present Illness  The patient is a 80 year old female who presents with breast cancer.We are asked to see the patient in consultation by Dr. Lindi Adie to evaluate her for a new left breast cancer. The patient is a 80 year old white female who presents with screen detected calcs in the LIQ of the left breast measuring 46m. This was biopsied and came back as high grade dcis that was ER and PR+. She is otherwise in good health and does not smoke.   Past Surgical History Breast Biopsy   Left. Cataract Surgery   Bilateral. Colon Polyp Removal - Colonoscopy   Gallbladder Surgery - Laparoscopic   Lap Band   Spinal Surgery - Neck    Diagnostic Studies History  Colonoscopy   within last year Mammogram   within last year Pap Smear   1-5 years ago  Medication History  Medications Reconciled   Social History  Alcohol use   Occasional alcohol use. Caffeine use   Coffee, Tea. No alcohol use   No drug use   Tobacco use   Never smoker.  Family History  Breast Cancer   Mother. Cancer   Mother. Cerebrovascular Accident   Son. Diabetes Mellitus   Brother. Heart Disease   Father, Mother. Hypertension   Father, Mother, Son. Kidney Disease   Mother. Respiratory Condition   Mother. Thyroid problems   Mother.  Pregnancy / Birth History  Age at menarche   89years. Age of menopause   [age]Contraceptive History   Intrauterine device, Oral contraceptives. Gravida   3 Maternal age   15-20Para   3 Regular periods    Other Problems  Breast Cancer   Cholelithiasis   Hypercholesterolemia      Review of Systems  General Not Present- Appetite Loss, Chills, Fatigue, Fever, Night Sweats, Weight Gain and Weight Loss. Skin Not Present- Change in Wart/Mole, Dryness, Hives, Jaundice, New Lesions, Non-Healing Wounds, Rash and Ulcer. HEENT Present-  Wears glasses/contact lenses. Not Present- Earache, Hearing Loss, Hoarseness, Nose Bleed, Oral Ulcers, Ringing in the Ears, Seasonal Allergies, Sinus Pain, Sore Throat, Visual Disturbances and Yellow Eyes. Respiratory Not Present- Bloody sputum, Chronic Cough, Difficulty Breathing, Snoring and Wheezing. Breast Not Present- Breast Mass, Breast Pain, Nipple Discharge and Skin Changes. Cardiovascular Present- Swelling of Extremities. Not Present- Chest Pain, Difficulty Breathing Lying Down, Leg Cramps, Palpitations, Rapid Heart Rate and Shortness of Breath. Gastrointestinal Not Present- Abdominal Pain, Bloating, Bloody Stool, Change in Bowel Habits, Chronic diarrhea, Constipation, Difficulty Swallowing, Excessive gas, Gets full quickly at meals, Hemorrhoids, Indigestion, Nausea, Rectal Pain and Vomiting. Female Genitourinary Not Present- Frequency, Nocturia, Painful Urination, Pelvic Pain and Urgency. Musculoskeletal Not Present- Back Pain, Joint Pain, Joint Stiffness, Muscle Pain, Muscle Weakness and Swelling of Extremities. Neurological Not Present- Decreased Memory, Fainting, Headaches, Numbness, Seizures, Tingling, Tremor, Trouble walking and Weakness. Psychiatric Present- Frequent crying. Not Present- Anxiety, Bipolar, Change in Sleep Pattern, Depression and Fearful. Endocrine Not Present- Cold Intolerance, Excessive Hunger, Hair Changes, Heat Intolerance, Hot flashes and New Diabetes. Hematology Not Present- Blood Thinners, Easy Bruising, Excessive bleeding, Gland problems, HIV and Persistent Infections.   Physical Exam  General Mental Status - Alert. General Appearance - Consistent with stated age. Hydration - Well hydrated. Voice - Normal.  Head and Neck Head - normocephalic, atraumatic with no lesions or palpable masses. Trachea - midline. Thyroid Gland  Characteristics - normal size and consistency.  Eye Eyeball - Bilateral - Extraocular movements intact. Sclera/Conjunctiva -  Bilateral - No scleral icterus.  Chest and Lung Exam Chest and lung exam reveals  - quiet, even and easy respiratory effort with no use of accessory muscles and on auscultation, normal breath sounds, no adventitious sounds and normal vocal resonance. Inspection Chest Wall - Normal. Back - normal.  Breast Note:  there is no palpable mass in either breast. there is no palpable axillary, supraclavicular, or cervical lymphadenopathy   Cardiovascular Cardiovascular examination reveals  - normal heart sounds, regular rate and rhythm with no murmurs and normal pedal pulses bilaterally.  Abdomen Inspection Inspection of the abdomen reveals - No Hernias. Skin - Scar - no surgical scars. Palpation/Percussion Palpation and Percussion of the abdomen reveal - Soft, Non Tender, No Rebound tenderness, No Rigidity (guarding) and No hepatosplenomegaly. Auscultation Auscultation of the abdomen reveals - Bowel sounds normal.  Neurologic Neurologic evaluation reveals  - alert and oriented x 3 with no impairment of recent or remote memory. Mental Status - Normal.  Musculoskeletal Normal Exam - Left - Upper Extremity Strength Normal and Lower Extremity Strength Normal. Normal Exam - Right - Upper Extremity Strength Normal and Lower Extremity Strength Normal.  Lymphatic Head & Neck  General Head & Neck Lymphatics: Bilateral - Description - Normal. Axillary  General Axillary Region: Bilateral - Description - Normal. Tenderness - Non Tender. Femoral & Inguinal  Generalized Femoral & Inguinal Lymphatics: Bilateral - Description - Normal. Tenderness - Non Tender.    Assessment & Plan  MALIGNANT NEOPLASM OF LOWER-INNER QUADRANT OF LEFT BREAST IN FEMALE, ESTROGEN RECEPTOR POSITIVE (C50.312) Impression: The patient appears to have a small area of dcis in the LIQ of the left breast. I have discussed with her the different options for surgical treatment and at this point she favors breast conservation  which I feel is appropriate. She will not need a node evaluation. I have discussed with her in detail the risks and benefits of the surgery as well as some of the technical aspects including the use of a radioactive seed and she understands and wishes to proceed. She will meet with medical and radiation oncology to discuss adjuvant therapy. This patient encounter took 60 minutes today to perform the following: take history, perform exam, review outside records, interpret imaging, counsel the patient on their diagnosis and document encounter, findings & plan in the EHR Current Plans Referred to Oncology, for evaluation and follow up (Oncology). Routine.

## 2021-04-16 NOTE — Op Note (Signed)
04/16/2021  9:17 AM  PATIENT:  Jessica Livingston  80 y.o. female  PRE-OPERATIVE DIAGNOSIS:  LEFT BREAST DUCTAL CARCINOMA IN SITU  POST-OPERATIVE DIAGNOSIS:  LEFT BREAST DUCTAL CARCINOMA IN SITU  PROCEDURE:  Procedure(s): LEFT BREAST LUMPECTOMY WITH RADIOACTIVE SEED LOCALIZATION (Left)  SURGEON:  Surgeon(s) and Role:    * Jovita Kussmaul, MD - Primary  PHYSICIAN ASSISTANT:   ASSISTANTS: none   ANESTHESIA:   local and general  EBL:  minimal   BLOOD ADMINISTERED:none  DRAINS: none   LOCAL MEDICATIONS USED:  MARCAINE     SPECIMEN:  Source of Specimen:  left breast tissue with additional inferior and lateral margins  DISPOSITION OF SPECIMEN:  PATHOLOGY  COUNTS:  YES  TOURNIQUET:  * No tourniquets in log *  DICTATION: .Dragon Dictation  After informed consent was obtained the patient was brought to the operating room and placed in the supine position on the operating table.  After adequate induction of general anesthesia the patient's left breast was prepped with ChloraPrep, allowed to dry, and draped in usual sterile manner.  An appropriate timeout was performed.  Previously an I-125 seed was placed in the lower aspect of the left breast to mark an area of ductal carcinoma in situ.  The neoprobe was set to I-125 in the area of radioactivity was readily identified.  The area around this was infiltrated with quarter percent Marcaine.  A curvilinear incision was made along the lower edge of the areola of the left breast with a 15 blade knife.  The incision was carried through the skin and subcutaneous tissue sharply with the electrocautery.  Dissection was then carried out widely around the radioactive seed while checking the area of radioactivity frequently.  Once this was accomplished the specimen was removed from the patient.  It was oriented with the appropriate paint colors.  A specimen radiograph was obtained that showed the clip and seed to be within the specimen near the inferior  and lateral edge.  I elected to take an additional inferior and lateral margin and these were also marked appropriately.  All of the tissue was then sent to pathology for further evaluation.  Hemostasis was achieved using the Bovie electrocautery.  The wound was irrigated with saline and infiltrated with more quarter percent Marcaine.  The cavity was marked with clips.  The deep layer of the wound was then closed with layers of interrupted 3-0 Vicryl stitches.  The skin was then closed with interrupted 4-0 Monocryl subcuticular stitches.  Dermabond dressings were applied.  The patient tolerated the procedure well.  At the end of the case all needle sponge and instrument counts were correct.  The patient was then awakened and taken to recovery in stable condition.  PLAN OF CARE: Discharge to home after PACU  PATIENT DISPOSITION:  PACU - hemodynamically stable.   Delay start of Pharmacological VTE agent (>24hrs) due to surgical blood loss or risk of bleeding: not applicable

## 2021-04-16 NOTE — Transfer of Care (Signed)
Immediate Anesthesia Transfer of Care Note  Patient: Jessica Livingston  Procedure(s) Performed: LEFT BREAST LUMPECTOMY WITH RADIOACTIVE SEED LOCALIZATION (Left: Breast)  Patient Location: PACU  Anesthesia Type:General  Level of Consciousness: awake, alert , oriented and patient cooperative  Airway & Oxygen Therapy: Patient Spontanous Breathing and Patient connected to nasal cannula oxygen  Post-op Assessment: Report given to RN and Post -op Vital signs reviewed and stable  Post vital signs: Reviewed and stable  Last Vitals:  Vitals Value Taken Time  BP 135/60   Temp    Pulse 70 04/16/21 0925  Resp 22 04/16/21 0925  SpO2 100 % 04/16/21 0925  Vitals shown include unvalidated device data.  Last Pain:  Vitals:   04/16/21 0654  TempSrc:   PainSc: 0-No pain         Complications: No notable events documented.

## 2021-04-16 NOTE — Anesthesia Postprocedure Evaluation (Signed)
Anesthesia Post Note  Patient: Jessica Livingston  Procedure(s) Performed: LEFT BREAST LUMPECTOMY WITH RADIOACTIVE SEED LOCALIZATION (Left: Breast)     Patient location during evaluation: PACU Anesthesia Type: General Level of consciousness: awake and alert Pain management: pain level controlled Vital Signs Assessment: post-procedure vital signs reviewed and stable Respiratory status: spontaneous breathing, nonlabored ventilation and respiratory function stable Cardiovascular status: stable and blood pressure returned to baseline Anesthetic complications: no   No notable events documented.  Last Vitals:  Vitals:   04/16/21 0942 04/16/21 0955  BP: (!) 141/65   Pulse: 69 66  Resp: 18 16  Temp:    SpO2: 96% 96%    Last Pain:  Vitals:   04/16/21 0955  TempSrc:   PainSc: 0-No pain                 Audry Pili

## 2021-04-16 NOTE — Anesthesia Procedure Notes (Signed)
Procedure Name: LMA Insertion Date/Time: 04/16/2021 8:32 AM Performed by: Lowella Dell, CRNA Pre-anesthesia Checklist: Patient identified, Emergency Drugs available, Suction available and Patient being monitored Patient Re-evaluated:Patient Re-evaluated prior to induction Oxygen Delivery Method: Circle System Utilized Preoxygenation: Pre-oxygenation with 100% oxygen Induction Type: IV induction Ventilation: Mask ventilation without difficulty LMA: LMA inserted LMA Size: 3.0 Number of attempts: 1 Airway Equipment and Method: Bite block Placement Confirmation: positive ETCO2 Tube secured with: Tape Dental Injury: Teeth and Oropharynx as per pre-operative assessment

## 2021-04-17 ENCOUNTER — Encounter (HOSPITAL_COMMUNITY): Payer: Self-pay | Admitting: General Surgery

## 2021-04-17 LAB — SURGICAL PATHOLOGY

## 2021-04-21 ENCOUNTER — Encounter: Payer: Self-pay | Admitting: *Deleted

## 2021-04-26 NOTE — Progress Notes (Signed)
Patient Care Team: Deland Pretty, MD as PCP - General (Internal Medicine) Mauro Kaufmann, RN as Oncology Nurse Navigator Rockwell Germany, RN as Oncology Nurse Navigator  DIAGNOSIS:    ICD-10-CM   1. Ductal carcinoma in situ (DCIS) of left breast  D05.12       SUMMARY OF ONCOLOGIC HISTORY: Oncology History  Ductal carcinoma in situ (DCIS) of left breast  02/17/2021 Initial Diagnosis   Screening mammogram detected left breast calcifications lower inner quadrant 0.9 cm: Biopsy revealed high-grade DCIS with necrosis and calcifications, ER 30%, PR 50% weak   02/25/2021 Cancer Staging   Staging form: Breast, AJCC 8th Edition - Clinical stage from 02/25/2021: Stage 0 (cTis (DCIS), cN0, cM0, G3, ER+, PR+, HER2: Not Assessed) - Signed by Nicholas Lose, MD on 02/25/2021 Stage prefix: Initial diagnosis Histologic grading system: 3 grade system     CHIEF COMPLIANT: Follow-up of left breast cancer  INTERVAL HISTORY: Jessica Livingston is a 80 y.o. with above-mentioned history of DCIS of the left breast. She underwent left breast lumpectomy on 04/16/2021 with Dr. Marlou Starks for which the pathology showed high-grade DCIS with comedonecrosis and no invasive carcinoma. She presents to the clinic today for follow-up.   ALLERGIES:  is allergic to atorvastatin and rosuvastatin.  MEDICATIONS:  Current Outpatient Medications  Medication Sig Dispense Refill   Besifloxacin HCl (BESIVANCE) 0.6 % SUSP Place 1 drop into both eyes See admin instructions. Instill 1 drop into both eyes 3 times daily once a month on the day of monthly eye injections     Cyanocobalamin (VITAMIN B-12 PO) Take 1 capsule by mouth daily.     HYDROcodone-acetaminophen (NORCO/VICODIN) 5-325 MG tablet Take 1-2 tablets by mouth every 6 (six) hours as needed for moderate pain or severe pain. 10 tablet 0   VITAMIN D PO Take 1 capsule by mouth daily.     No current facility-administered medications for this visit.    PHYSICAL  EXAMINATION: ECOG PERFORMANCE STATUS: 1 - Symptomatic but completely ambulatory  Vitals:   04/27/21 1350  BP: (!) 138/57  Pulse: 70  Resp: 18  Temp: (!) 97.3 F (36.3 C)  SpO2: 96%   Filed Weights   04/27/21 1350  Weight: 209 lb 4.8 oz (94.9 kg)     LABORATORY DATA:  I have reviewed the data as listed CMP Latest Ref Rng & Units 04/08/2021 02/25/2021 12/24/2010  Glucose 70 - 99 mg/dL 112(H) 136(H) -  BUN 8 - 23 mg/dL 9 10 -  Creatinine 0.44 - 1.00 mg/dL 0.82 0.90 -  Sodium 135 - 145 mmol/L 139 142 -  Potassium 3.5 - 5.1 mmol/L 3.8 4.2 -  Chloride 98 - 111 mmol/L 103 102 -  CO2 22 - 32 mmol/L 29 28 -  Calcium 8.9 - 10.3 mg/dL 9.1 9.4 -  Total Protein 6.5 - 8.1 g/dL - 7.4 -  Total Bilirubin 0.3 - 1.2 mg/dL - 0.7 -  Alkaline Phos 38 - 126 U/L - 79 -  AST 15 - 41 U/L - 19 18  ALT 0 - 44 U/L - 14 11    Lab Results  Component Value Date   WBC 6.3 04/08/2021   HGB 15.4 (H) 04/08/2021   HCT 47.2 (H) 04/08/2021   MCV 94.4 04/08/2021   PLT 220 04/08/2021   NEUTROABS 4.6 02/25/2021    ASSESSMENT & PLAN:  Ductal carcinoma in situ (DCIS) of left breast 02/17/2021:Screening mammogram detected left breast calcifications lower inner quadrant 0.9 cm: Biopsy revealed high-grade  DCIS with necrosis and calcifications, ER 30%, PR 50% weak   04/16/2021: Left lumpectomy: High-grade DCIS with comedonecrosis, margins negative, ER 30%, PR 50%  Pathology counseling: I discussed the final pathology report of the patient provided  a copy of this report. I discussed the margins.  We also discussed the final staging along with previously performed ER/PR testing.  Treatment plan: 1. adjuvant radiation therapy 2. Followed by antiestrogen therapy with tamoxifen versus anastrozole 5 years (patient had uterine polyps and has an IUD in place to combat that) we may consider using anastrozole instead.  However she does worry about osteoporosis risk.  Return to clinic after radiation to discuss  antiestrogen therapy    No orders of the defined types were placed in this encounter.  The patient has a good understanding of the overall plan. she agrees with it. she will call with any problems that may develop before the next visit here.  Total time spent: 20 mins including face to face time and time spent for planning, charting and coordination of care  Rulon Eisenmenger, MD, MPH 04/27/2021  I, Thana Ates, am acting as scribe for Dr. Nicholas Lose.  I have reviewed the above documentation for accuracy and completeness, and I agree with the above.

## 2021-04-27 ENCOUNTER — Inpatient Hospital Stay: Payer: PPO | Attending: Hematology and Oncology | Admitting: Hematology and Oncology

## 2021-04-27 ENCOUNTER — Other Ambulatory Visit: Payer: Self-pay

## 2021-04-27 DIAGNOSIS — D0512 Intraductal carcinoma in situ of left breast: Secondary | ICD-10-CM | POA: Insufficient documentation

## 2021-04-27 NOTE — Assessment & Plan Note (Signed)
02/17/2021:Screening mammogram detected left breast calcifications lower inner quadrant 0.9 cm: Biopsy revealed high-grade DCIS with necrosis and calcifications, ER 30%, PR 50% weak   04/16/2021: Left lumpectomy: High-grade DCIS with comedonecrosis, margins negative, ER 30%, PR 50%  Pathology counseling: I discussed the final pathology report of the patient provided  a copy of this report. I discussed the margins.  We also discussed the final staging along with previously performed ER/PR testing.  Treatment plan: 1. adjuvant radiation therapy 2. Followed by antiestrogen therapy with tamoxifen versus anastrozole 5 years (patient had uterine polyps and has an IUD in place to combat that) we may consider using anastrozole instead.  However she does worry about osteoporosis risk.  Return to clinic after radiation to discuss antiestrogen therapy

## 2021-04-29 ENCOUNTER — Other Ambulatory Visit: Payer: Self-pay

## 2021-04-29 ENCOUNTER — Encounter (INDEPENDENT_AMBULATORY_CARE_PROVIDER_SITE_OTHER): Payer: PPO | Admitting: Ophthalmology

## 2021-04-29 DIAGNOSIS — H353231 Exudative age-related macular degeneration, bilateral, with active choroidal neovascularization: Secondary | ICD-10-CM | POA: Diagnosis not present

## 2021-04-29 DIAGNOSIS — H43813 Vitreous degeneration, bilateral: Secondary | ICD-10-CM | POA: Diagnosis not present

## 2021-05-06 DIAGNOSIS — L237 Allergic contact dermatitis due to plants, except food: Secondary | ICD-10-CM | POA: Diagnosis not present

## 2021-05-06 DIAGNOSIS — T63421A Toxic effect of venom of ants, accidental (unintentional), initial encounter: Secondary | ICD-10-CM | POA: Diagnosis not present

## 2021-05-06 DIAGNOSIS — Z853 Personal history of malignant neoplasm of breast: Secondary | ICD-10-CM | POA: Diagnosis not present

## 2021-05-13 ENCOUNTER — Ambulatory Visit: Payer: PPO

## 2021-05-13 ENCOUNTER — Ambulatory Visit: Payer: PPO | Admitting: Radiation Oncology

## 2021-05-13 NOTE — Progress Notes (Signed)
Radiation Oncology         [phone] ________________________________  Name: Jessica Livingston MRN: [id number]  Date: 05/14/2021  DOB: 1940-09-04  Re-Evaluation Note  CC: Deland Pretty, MD  Nicholas Lose, MD    ICD-10-CM   1. Ductal carcinoma in situ (DCIS) of left breast  D05.12       Diagnosis:   Stage 0 (cTis (DCIS), cN0, cM0) Left Breast, High-Grade Ductal Carcinoma in-situ, ER+ / PR+   Narrative:  The patient returns today to discuss radiation treatment options. She was seen in the multidisciplinary breast clinic on 02/25/21.   She opted to proceed with left breast lumpectomy on 04/16/21 under the care of Dr. Marlou Starks. Pathology from the procedure revealed: high-grade ductal carcinoma in-situ with comedonecrosis; with no invasive carcinoma identified. Excision of the left breast additional inferior margin also performed revealed fibrosis, calcifications, fibrocystic changes, ductal epithelial hyperplasia of the usual type, and hemorrhage. Left breast additional lateral margin excision revealed benign breast tissue.  Final margins clear. prognostic indicators significant for ER: 30%, positive, with moderate staining intensity, PR: 50%, positive, with weak staining intensity.  Since then, the patient most recently followed up with Dr. Lindi Adie on 04/27/21. During which time, Dr. Lindi Adie went over the patient's treatment plan consisting of adjuvant radiation therapy, followed by antiestrogen therapy with tamoxifen versus anastrozole 5 years.  (Patient had uterine polyps and has an IUD in place to combat that, Dr. Lindi Adie noted that he may consider using anastrozole instead).   On review of systems, the patient reports recovering well from her surgery.. She denies pain within the breast or signs of infection or swelling in her left arm and any other symptoms.    Allergies:  is allergic to atorvastatin and rosuvastatin.  Meds: Current Outpatient Medications  Medication Sig Dispense Refill    Besifloxacin HCl (BESIVANCE) 0.6 % SUSP Place 1 drop into both eyes See admin instructions. Instill 1 drop into both eyes 3 times daily once a month on the day of monthly eye injections     Cyanocobalamin (VITAMIN B-12 PO) Take 1 capsule by mouth daily.     VITAMIN D PO Take 1 capsule by mouth daily.     No current facility-administered medications for this encounter.    Physical Findings: The patient is in no acute distress. Patient is alert and oriented. No significant changes. Lungs are clear to auscultation bilaterally. Heart has regular rate and rhythm. No palpable cervical, supraclavicular, or axillary adenopathy. Abdomen soft, non-tender, normal bowel sounds. Right breast: no palpable mass, nipple discharge or bleeding. Left breast: Periareolar scar noted without signs of drainage or infection.  No significant swelling noted in the breast.  No signs of infection of the breast.  No dominant mass appreciated in the breast.  Lab Findings: Lab Results  Component Value Date   WBC 6.3 04/08/2021   HGB 15.4 (H) 04/08/2021   HCT 47.2 (H) 04/08/2021   MCV 94.4 04/08/2021   PLT 220 04/08/2021    Radiographic Findings: No results found.  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  In light of the high-grade nature of the malignancy I would recommend radiation therapy despite her age of 30.  I discussed the overall treatment course side effects and potential toxicities of radiation therapy in this situation with the patient.  She appears to understand  and wishes to proceed with planned course of treatment.  Plan:  Patient is scheduled for CT simulation later today.  Patient would be a good candidate for hypofractionated accelerated radiation therapy over approximately 4 weeks.  We will use cardiac sparing techniques if  necessary.  -----------------------------------  Blair Promise, PhD, MD  This document serves as a record of services personally performed by Gery Pray, MD. It was created on his behalf by Roney Mans, a trained medical scribe. The creation of this record is based on the scribe's personal observations and the provider's statements to them. This document has been checked and approved by the attending provider.

## 2021-05-13 NOTE — Progress Notes (Signed)
Location of Breast Cancer:  Ductal carcinoma in situ (DCIS) of LEFT breast    Histology per Pathology Report:  04/16/2021 FINAL MICROSCOPIC DIAGNOSIS:  A. BREAST, LEFT, LUMPECTOMY:  - Ductal carcinoma in situ, high-grade, with comedonecrosis.  - No invasive carcinoma identified.  - Biopsy site changes and hemorrhage.  - See oncology table.  B. BREAST, LEFT ADDITIONAL INFERIOR MARGIN, EXCISION:  - Fibrosis, calcification, fibrocystic changes, ductal epithelial hyperplasia of the usual type, and hemorrhage.  - No malignancy identified.  C. BREAST, LEFT ADDITIONAL LATERAL MARGIN, EXCISION:  - Benign breast tissue.  - No malignancy identified.    Receptor Status: ER(30%), PR (50%)   Did patient present with symptoms (if so, please note symptoms) or was this found on screening mammography?: Screening mammogram detected left breast calcifications lower inner quadrant 0.9 cm   Past/Anticipated interventions by surgeon, if any:  04/16/2021 Dr. Autumn Messing LEFT BREAST LUMPECTOMY WITH RADIOACTIVE SEED LOCALIZATION   Past/Anticipated interventions by medical oncology, if any:  Under care of Dr. Nicholas Lose 04/27/2021 --Treatment plan: Adjuvant radiation therapy Followed by antiestrogen therapy with tamoxifen versus anastrozole 5 years (patient had uterine polyps and has an IUD in place to combat that) we may consider using anastrozole instead.  However she does worry about osteoporosis risk. --Return to clinic after radiation to discuss antiestrogen therapy   Lymphedema issues, if any:  no     Pain issues, if any:  no    SAFETY ISSUES: Prior radiation? no Pacemaker/ICD? no Possible current pregnancy?No--postmenopausal Is the patient on methotrexate? no   Current Complaints / other details:  Patient is in for consult then CT of left breast for DCIS. She lost her husband 5 months ago to stroke and hip.

## 2021-05-14 ENCOUNTER — Institutional Professional Consult (permissible substitution): Payer: PPO | Admitting: Radiation Oncology

## 2021-05-14 ENCOUNTER — Encounter: Payer: Self-pay | Admitting: Radiation Oncology

## 2021-05-14 ENCOUNTER — Ambulatory Visit
Admission: RE | Admit: 2021-05-14 | Discharge: 2021-05-14 | Disposition: A | Discharge status: Home / Self Care | Payer: PPO | Source: Ambulatory Visit | Attending: Radiation Oncology | Admitting: Radiation Oncology

## 2021-05-14 ENCOUNTER — Other Ambulatory Visit: Payer: Self-pay

## 2021-05-14 ENCOUNTER — Telehealth: Payer: Self-pay | Admitting: Radiation Oncology

## 2021-05-14 DIAGNOSIS — D0512 Intraductal carcinoma in situ of left breast: Secondary | ICD-10-CM | POA: Insufficient documentation

## 2021-05-14 DIAGNOSIS — Z79811 Long term (current) use of aromatase inhibitors: Secondary | ICD-10-CM | POA: Insufficient documentation

## 2021-05-14 DIAGNOSIS — Z170 Estrogen receptor positive status [ER+]: Secondary | ICD-10-CM | POA: Diagnosis not present

## 2021-05-14 DIAGNOSIS — Z510 Encounter for antineoplastic radiation therapy: Secondary | ICD-10-CM | POA: Diagnosis not present

## 2021-05-14 DIAGNOSIS — Z79899 Other long term (current) drug therapy: Secondary | ICD-10-CM | POA: Insufficient documentation

## 2021-05-20 ENCOUNTER — Encounter: Payer: Self-pay | Admitting: *Deleted

## 2021-05-21 ENCOUNTER — Other Ambulatory Visit: Payer: Self-pay

## 2021-05-21 ENCOUNTER — Ambulatory Visit
Admission: RE | Admit: 2021-05-21 | Discharge: 2021-05-21 | Disposition: A | Discharge status: Home / Self Care | Payer: PPO | Source: Ambulatory Visit | Attending: Radiation Oncology | Admitting: Radiation Oncology

## 2021-05-21 DIAGNOSIS — D0512 Intraductal carcinoma in situ of left breast: Secondary | ICD-10-CM | POA: Diagnosis not present

## 2021-05-21 DIAGNOSIS — Z510 Encounter for antineoplastic radiation therapy: Secondary | ICD-10-CM | POA: Diagnosis not present

## 2021-05-22 ENCOUNTER — Ambulatory Visit
Admission: RE | Admit: 2021-05-22 | Discharge: 2021-05-22 | Disposition: A | Discharge status: Home / Self Care | Payer: PPO | Source: Ambulatory Visit | Attending: Radiation Oncology | Admitting: Radiation Oncology

## 2021-05-22 DIAGNOSIS — Z510 Encounter for antineoplastic radiation therapy: Secondary | ICD-10-CM | POA: Diagnosis not present

## 2021-05-22 DIAGNOSIS — D0512 Intraductal carcinoma in situ of left breast: Secondary | ICD-10-CM | POA: Diagnosis not present

## 2021-05-25 ENCOUNTER — Ambulatory Visit
Admission: RE | Admit: 2021-05-25 | Discharge: 2021-05-25 | Disposition: A | Discharge status: Home / Self Care | Payer: PPO | Source: Ambulatory Visit | Attending: Radiation Oncology | Admitting: Radiation Oncology

## 2021-05-25 DIAGNOSIS — D0512 Intraductal carcinoma in situ of left breast: Secondary | ICD-10-CM | POA: Diagnosis not present

## 2021-05-25 DIAGNOSIS — Z510 Encounter for antineoplastic radiation therapy: Secondary | ICD-10-CM | POA: Diagnosis not present

## 2021-05-26 ENCOUNTER — Ambulatory Visit
Admission: RE | Admit: 2021-05-26 | Discharge: 2021-05-26 | Disposition: A | Discharge status: Home / Self Care | Payer: PPO | Source: Ambulatory Visit | Attending: Radiation Oncology | Admitting: Radiation Oncology

## 2021-05-26 ENCOUNTER — Other Ambulatory Visit: Payer: Self-pay

## 2021-05-26 DIAGNOSIS — Z510 Encounter for antineoplastic radiation therapy: Secondary | ICD-10-CM | POA: Diagnosis not present

## 2021-05-26 DIAGNOSIS — D0512 Intraductal carcinoma in situ of left breast: Secondary | ICD-10-CM | POA: Diagnosis not present

## 2021-05-26 MED ORDER — RADIAPLEXRX EX GEL
1.0000 "application " | Freq: Once | CUTANEOUS | Status: AC
  Administered 2021-05-26: 1 via TOPICAL

## 2021-05-26 MED ORDER — ALRA NON-METALLIC DEODORANT (RAD-ONC)
1.0000 "application " | Freq: Once | TOPICAL | Status: AC
  Administered 2021-05-26: 1 via TOPICAL

## 2021-05-27 ENCOUNTER — Ambulatory Visit: Payer: PPO

## 2021-05-28 ENCOUNTER — Ambulatory Visit
Admission: RE | Admit: 2021-05-28 | Discharge: 2021-05-28 | Disposition: A | Discharge status: Home / Self Care | Payer: PPO | Source: Ambulatory Visit | Attending: Radiation Oncology | Admitting: Radiation Oncology

## 2021-05-28 ENCOUNTER — Other Ambulatory Visit: Payer: Self-pay

## 2021-05-28 DIAGNOSIS — Z510 Encounter for antineoplastic radiation therapy: Secondary | ICD-10-CM | POA: Diagnosis not present

## 2021-05-28 DIAGNOSIS — D0512 Intraductal carcinoma in situ of left breast: Secondary | ICD-10-CM | POA: Diagnosis not present

## 2021-05-29 ENCOUNTER — Ambulatory Visit
Admission: RE | Admit: 2021-05-29 | Discharge: 2021-05-29 | Disposition: A | Discharge status: Home / Self Care | Payer: PPO | Source: Ambulatory Visit | Attending: Radiation Oncology | Admitting: Radiation Oncology

## 2021-05-29 DIAGNOSIS — Z510 Encounter for antineoplastic radiation therapy: Secondary | ICD-10-CM | POA: Diagnosis not present

## 2021-05-29 DIAGNOSIS — D0512 Intraductal carcinoma in situ of left breast: Secondary | ICD-10-CM | POA: Diagnosis not present

## 2021-06-01 ENCOUNTER — Ambulatory Visit
Admission: RE | Admit: 2021-06-01 | Discharge: 2021-06-01 | Disposition: A | Discharge status: Home / Self Care | Payer: PPO | Source: Ambulatory Visit | Attending: Radiation Oncology | Admitting: Radiation Oncology

## 2021-06-01 ENCOUNTER — Ambulatory Visit: Payer: PPO

## 2021-06-01 ENCOUNTER — Other Ambulatory Visit: Payer: Self-pay

## 2021-06-01 ENCOUNTER — Encounter (INDEPENDENT_AMBULATORY_CARE_PROVIDER_SITE_OTHER): Payer: PPO | Admitting: Ophthalmology

## 2021-06-01 DIAGNOSIS — I10 Essential (primary) hypertension: Secondary | ICD-10-CM

## 2021-06-01 DIAGNOSIS — Z510 Encounter for antineoplastic radiation therapy: Secondary | ICD-10-CM | POA: Insufficient documentation

## 2021-06-01 DIAGNOSIS — D0512 Intraductal carcinoma in situ of left breast: Secondary | ICD-10-CM | POA: Insufficient documentation

## 2021-06-01 DIAGNOSIS — H353231 Exudative age-related macular degeneration, bilateral, with active choroidal neovascularization: Secondary | ICD-10-CM | POA: Diagnosis not present

## 2021-06-01 DIAGNOSIS — H43813 Vitreous degeneration, bilateral: Secondary | ICD-10-CM

## 2021-06-01 DIAGNOSIS — H35033 Hypertensive retinopathy, bilateral: Secondary | ICD-10-CM

## 2021-06-01 DIAGNOSIS — Z923 Personal history of irradiation: Secondary | ICD-10-CM | POA: Diagnosis not present

## 2021-06-02 ENCOUNTER — Ambulatory Visit
Admission: RE | Admit: 2021-06-02 | Discharge: 2021-06-02 | Disposition: A | Discharge status: Home / Self Care | Payer: PPO | Source: Ambulatory Visit | Attending: Radiation Oncology | Admitting: Radiation Oncology

## 2021-06-02 DIAGNOSIS — Z510 Encounter for antineoplastic radiation therapy: Secondary | ICD-10-CM | POA: Diagnosis not present

## 2021-06-02 DIAGNOSIS — D0512 Intraductal carcinoma in situ of left breast: Secondary | ICD-10-CM | POA: Diagnosis not present

## 2021-06-03 ENCOUNTER — Ambulatory Visit
Admission: RE | Admit: 2021-06-03 | Discharge: 2021-06-03 | Disposition: A | Discharge status: Home / Self Care | Payer: PPO | Source: Ambulatory Visit | Attending: Radiation Oncology | Admitting: Radiation Oncology

## 2021-06-03 ENCOUNTER — Other Ambulatory Visit: Payer: Self-pay

## 2021-06-03 DIAGNOSIS — D0512 Intraductal carcinoma in situ of left breast: Secondary | ICD-10-CM | POA: Diagnosis not present

## 2021-06-03 DIAGNOSIS — Z510 Encounter for antineoplastic radiation therapy: Secondary | ICD-10-CM | POA: Diagnosis not present

## 2021-06-04 ENCOUNTER — Other Ambulatory Visit: Payer: Self-pay

## 2021-06-04 ENCOUNTER — Ambulatory Visit
Admission: RE | Admit: 2021-06-04 | Discharge: 2021-06-04 | Disposition: A | Discharge status: Home / Self Care | Payer: PPO | Source: Ambulatory Visit | Attending: Radiation Oncology | Admitting: Radiation Oncology

## 2021-06-04 DIAGNOSIS — D0512 Intraductal carcinoma in situ of left breast: Secondary | ICD-10-CM | POA: Diagnosis not present

## 2021-06-04 DIAGNOSIS — Z510 Encounter for antineoplastic radiation therapy: Secondary | ICD-10-CM | POA: Diagnosis not present

## 2021-06-05 ENCOUNTER — Other Ambulatory Visit: Payer: Self-pay

## 2021-06-05 ENCOUNTER — Ambulatory Visit
Admission: RE | Admit: 2021-06-05 | Discharge: 2021-06-05 | Disposition: A | Discharge status: Home / Self Care | Payer: PPO | Source: Ambulatory Visit | Attending: Radiation Oncology | Admitting: Radiation Oncology

## 2021-06-05 DIAGNOSIS — D0512 Intraductal carcinoma in situ of left breast: Secondary | ICD-10-CM | POA: Diagnosis not present

## 2021-06-05 DIAGNOSIS — Z510 Encounter for antineoplastic radiation therapy: Secondary | ICD-10-CM | POA: Diagnosis not present

## 2021-06-06 NOTE — Progress Notes (Signed)
Patient Care Team: Deland Pretty, MD as PCP - General (Internal Medicine) Mauro Kaufmann, RN as Oncology Nurse Navigator Rockwell Germany, RN as Oncology Nurse Navigator  DIAGNOSIS:    ICD-10-CM   1. Ductal carcinoma in situ (DCIS) of left breast  D05.12       SUMMARY OF ONCOLOGIC HISTORY: Oncology History  Ductal carcinoma in situ (DCIS) of left breast  02/17/2021 Initial Diagnosis   Screening mammogram detected left breast calcifications lower inner quadrant 0.9 cm: Biopsy revealed high-grade DCIS with necrosis and calcifications, ER 30%, PR 50% weak   02/25/2021 Cancer Staging   Staging form: Breast, AJCC 8th Edition - Clinical stage from 02/25/2021: Stage 0 (cTis (DCIS), cN0, cM0, G3, ER+, PR+, HER2: Not Assessed) - Signed by Nicholas Lose, MD on 02/25/2021 Stage prefix: Initial diagnosis Histologic grading system: 3 grade system     CHIEF COMPLIANT: Follow-up of left breast DCIS  INTERVAL HISTORY: Jessica Livingston is a 80 y.o. with above-mentioned history of left breast DCIS having undergone lumpectomy, currently on radiation therapy. She presents to the clinic today for follow-up.    ALLERGIES:  is allergic to atorvastatin and rosuvastatin.  MEDICATIONS:  Current Outpatient Medications  Medication Sig Dispense Refill   Besifloxacin HCl (BESIVANCE) 0.6 % SUSP Place 1 drop into both eyes See admin instructions. Instill 1 drop into both eyes 3 times daily once a month on the day of monthly eye injections     Cyanocobalamin (VITAMIN B-12 PO) Take 1 capsule by mouth daily.     VITAMIN D PO Take 1 capsule by mouth daily.     No current facility-administered medications for this visit.    PHYSICAL EXAMINATION: ECOG PERFORMANCE STATUS: 1 - Symptomatic but completely ambulatory  Vitals:   06/08/21 0850  BP: (!) 154/62  Pulse: 68  Resp: 18  Temp: (!) 97.4 F (36.3 C)  SpO2: 100%   Filed Weights   06/08/21 0850  Weight: 211 lb 6.4 oz (95.9 kg)      LABORATORY  DATA:  I have reviewed the data as listed CMP Latest Ref Rng & Units 04/08/2021 02/25/2021 12/24/2010  Glucose 70 - 99 mg/dL 112(H) 136(H) -  BUN 8 - 23 mg/dL 9 10 -  Creatinine 0.44 - 1.00 mg/dL 0.82 0.90 -  Sodium 135 - 145 mmol/L 139 142 -  Potassium 3.5 - 5.1 mmol/L 3.8 4.2 -  Chloride 98 - 111 mmol/L 103 102 -  CO2 22 - 32 mmol/L 29 28 -  Calcium 8.9 - 10.3 mg/dL 9.1 9.4 -  Total Protein 6.5 - 8.1 g/dL - 7.4 -  Total Bilirubin 0.3 - 1.2 mg/dL - 0.7 -  Alkaline Phos 38 - 126 U/L - 79 -  AST 15 - 41 U/L - 19 18  ALT 0 - 44 U/L - 14 11    Lab Results  Component Value Date   WBC 6.3 04/08/2021   HGB 15.4 (H) 04/08/2021   HCT 47.2 (H) 04/08/2021   MCV 94.4 04/08/2021   PLT 220 04/08/2021   NEUTROABS 4.6 02/25/2021    ASSESSMENT & PLAN:  Ductal carcinoma in situ (DCIS) of left breast 02/17/2021:Screening mammogram detected left breast calcifications lower inner quadrant 0.9 cm: Biopsy revealed high-grade DCIS with necrosis and calcifications, ER 30%, PR 50% weak    04/16/2021: Left lumpectomy: High-grade DCIS with comedonecrosis, margins negative, ER 30%, PR 50%   Treatment plan: 1. adjuvant radiation therapy 05/22/21-06/18/2021 2. after much discussion about the pros and  cons of antiestrogen therapy, we decided that she would not need to go on the antiestrogen treatments especially given her age and her multiple issues with uterine polyps.      Return to clinic in 3 months for survivorship care plan visit after that she could be followed by Dr. Zoila Shutter care. She can call us at any time to be seen for any further problems with DCIS.   No orders of the defined types were placed in this encounter.  The patient has a good understanding of the overall plan. she agrees with it. she will call with any problems that may develop before the next visit here.  Total time spent: 20 mins including face to face time and time spent for planning, charting and coordination of care  Rulon Eisenmenger, MD, MPH 06/08/2021  I, Thana Ates, am acting as scribe for Dr. Nicholas Lose.  I have reviewed the above documentation for accuracy and completeness, and I agree with the above.

## 2021-06-07 DIAGNOSIS — Z510 Encounter for antineoplastic radiation therapy: Secondary | ICD-10-CM | POA: Diagnosis not present

## 2021-06-07 DIAGNOSIS — D0512 Intraductal carcinoma in situ of left breast: Secondary | ICD-10-CM | POA: Diagnosis not present

## 2021-06-08 ENCOUNTER — Inpatient Hospital Stay: Payer: PPO | Attending: Hematology and Oncology | Admitting: Hematology and Oncology

## 2021-06-08 ENCOUNTER — Ambulatory Visit
Admission: RE | Admit: 2021-06-08 | Discharge: 2021-06-08 | Disposition: A | Discharge status: Home / Self Care | Payer: PPO | Source: Ambulatory Visit | Attending: Radiation Oncology | Admitting: Radiation Oncology

## 2021-06-08 ENCOUNTER — Other Ambulatory Visit: Payer: Self-pay

## 2021-06-08 DIAGNOSIS — D0512 Intraductal carcinoma in situ of left breast: Secondary | ICD-10-CM | POA: Insufficient documentation

## 2021-06-08 DIAGNOSIS — Z923 Personal history of irradiation: Secondary | ICD-10-CM | POA: Insufficient documentation

## 2021-06-08 DIAGNOSIS — Z510 Encounter for antineoplastic radiation therapy: Secondary | ICD-10-CM | POA: Insufficient documentation

## 2021-06-08 NOTE — Assessment & Plan Note (Signed)
02/17/2021:Screening mammogram detected left breast calcifications lower inner quadrant 0.9 cm: Biopsy revealed high-grade DCIS with necrosis and calcifications, ER 30%, PR 50% weak   04/16/2021: Left lumpectomy: High-grade DCIS with comedonecrosis, margins negative, ER 30%, PR 50%  Treatment plan: 1. adjuvant radiation therapy 05/22/21-06/18/2021 2. Followed by antiestrogen therapy with tamoxifenversus Letrozole5 years(patient had uterine polyps and has an IUD in place to combat that) we may consider using letrozole instead. However she does worry about osteoporosis risk.  Letrozole counseling: We discussed the risks and benefits of anti-estrogen therapy with aromatase inhibitors. These include but not limited to insomnia, hot flashes, mood changes, vaginal dryness, bone density loss, and weight gain. We strongly believe that the benefits far outweigh the risks. Patient understands these risks and consented to starting treatment. Planned treatment duration is 5 years.   Return to clinic in 3 months for survivorship care plan visit

## 2021-06-09 ENCOUNTER — Ambulatory Visit: Payer: PPO | Admitting: Radiation Oncology

## 2021-06-09 ENCOUNTER — Ambulatory Visit
Admission: RE | Admit: 2021-06-09 | Discharge: 2021-06-09 | Disposition: A | Discharge status: Home / Self Care | Payer: PPO | Source: Ambulatory Visit | Attending: Radiation Oncology | Admitting: Radiation Oncology

## 2021-06-09 DIAGNOSIS — Z510 Encounter for antineoplastic radiation therapy: Secondary | ICD-10-CM | POA: Diagnosis not present

## 2021-06-09 DIAGNOSIS — D0512 Intraductal carcinoma in situ of left breast: Secondary | ICD-10-CM | POA: Diagnosis not present

## 2021-06-10 ENCOUNTER — Ambulatory Visit
Admission: RE | Admit: 2021-06-10 | Discharge: 2021-06-10 | Disposition: A | Discharge status: Home / Self Care | Payer: PPO | Source: Ambulatory Visit | Attending: Radiation Oncology | Admitting: Radiation Oncology

## 2021-06-10 ENCOUNTER — Other Ambulatory Visit: Payer: Self-pay

## 2021-06-10 DIAGNOSIS — D0512 Intraductal carcinoma in situ of left breast: Secondary | ICD-10-CM | POA: Diagnosis not present

## 2021-06-10 DIAGNOSIS — Z510 Encounter for antineoplastic radiation therapy: Secondary | ICD-10-CM | POA: Diagnosis not present

## 2021-06-11 ENCOUNTER — Ambulatory Visit
Admission: RE | Admit: 2021-06-11 | Discharge: 2021-06-11 | Disposition: A | Discharge status: Home / Self Care | Payer: PPO | Source: Ambulatory Visit | Attending: Radiation Oncology | Admitting: Radiation Oncology

## 2021-06-11 ENCOUNTER — Ambulatory Visit: Payer: PPO

## 2021-06-11 DIAGNOSIS — Z510 Encounter for antineoplastic radiation therapy: Secondary | ICD-10-CM | POA: Diagnosis not present

## 2021-06-11 DIAGNOSIS — D0512 Intraductal carcinoma in situ of left breast: Secondary | ICD-10-CM | POA: Diagnosis not present

## 2021-06-12 ENCOUNTER — Other Ambulatory Visit: Payer: Self-pay

## 2021-06-12 ENCOUNTER — Ambulatory Visit: Payer: PPO

## 2021-06-12 ENCOUNTER — Ambulatory Visit
Admission: RE | Admit: 2021-06-12 | Discharge: 2021-06-12 | Disposition: A | Discharge status: Home / Self Care | Payer: PPO | Source: Ambulatory Visit | Attending: Radiation Oncology | Admitting: Radiation Oncology

## 2021-06-12 DIAGNOSIS — D0512 Intraductal carcinoma in situ of left breast: Secondary | ICD-10-CM | POA: Diagnosis not present

## 2021-06-12 DIAGNOSIS — Z510 Encounter for antineoplastic radiation therapy: Secondary | ICD-10-CM | POA: Diagnosis not present

## 2021-06-15 ENCOUNTER — Ambulatory Visit
Admission: RE | Admit: 2021-06-15 | Discharge: 2021-06-15 | Disposition: A | Discharge status: Home / Self Care | Payer: PPO | Source: Ambulatory Visit | Attending: Radiation Oncology | Admitting: Radiation Oncology

## 2021-06-15 ENCOUNTER — Ambulatory Visit: Payer: PPO

## 2021-06-15 ENCOUNTER — Other Ambulatory Visit: Payer: Self-pay

## 2021-06-15 DIAGNOSIS — D0512 Intraductal carcinoma in situ of left breast: Secondary | ICD-10-CM | POA: Diagnosis not present

## 2021-06-15 DIAGNOSIS — Z510 Encounter for antineoplastic radiation therapy: Secondary | ICD-10-CM | POA: Diagnosis not present

## 2021-06-16 ENCOUNTER — Ambulatory Visit
Admission: RE | Admit: 2021-06-16 | Discharge: 2021-06-16 | Disposition: A | Discharge status: Home / Self Care | Payer: PPO | Source: Ambulatory Visit | Attending: Radiation Oncology | Admitting: Radiation Oncology

## 2021-06-16 ENCOUNTER — Other Ambulatory Visit: Payer: Self-pay

## 2021-06-16 ENCOUNTER — Encounter: Payer: Self-pay | Admitting: *Deleted

## 2021-06-16 DIAGNOSIS — Z510 Encounter for antineoplastic radiation therapy: Secondary | ICD-10-CM | POA: Diagnosis not present

## 2021-06-16 DIAGNOSIS — D0512 Intraductal carcinoma in situ of left breast: Secondary | ICD-10-CM | POA: Diagnosis not present

## 2021-06-17 ENCOUNTER — Ambulatory Visit
Admission: RE | Admit: 2021-06-17 | Discharge: 2021-06-17 | Disposition: A | Discharge status: Home / Self Care | Payer: PPO | Source: Ambulatory Visit | Attending: Radiation Oncology | Admitting: Radiation Oncology

## 2021-06-17 ENCOUNTER — Ambulatory Visit: Payer: PPO

## 2021-06-17 DIAGNOSIS — D0512 Intraductal carcinoma in situ of left breast: Secondary | ICD-10-CM | POA: Diagnosis not present

## 2021-06-17 DIAGNOSIS — Z510 Encounter for antineoplastic radiation therapy: Secondary | ICD-10-CM | POA: Diagnosis not present

## 2021-06-18 ENCOUNTER — Encounter: Payer: Self-pay | Admitting: Radiation Oncology

## 2021-06-18 ENCOUNTER — Ambulatory Visit
Admission: RE | Admit: 2021-06-18 | Discharge: 2021-06-18 | Disposition: A | Discharge status: Home / Self Care | Payer: PPO | Source: Ambulatory Visit | Attending: Radiation Oncology | Admitting: Radiation Oncology

## 2021-06-18 ENCOUNTER — Other Ambulatory Visit: Payer: Self-pay

## 2021-06-18 ENCOUNTER — Ambulatory Visit: Payer: PPO

## 2021-06-18 DIAGNOSIS — D0512 Intraductal carcinoma in situ of left breast: Secondary | ICD-10-CM | POA: Diagnosis not present

## 2021-06-18 DIAGNOSIS — Z510 Encounter for antineoplastic radiation therapy: Secondary | ICD-10-CM | POA: Diagnosis not present

## 2021-06-19 ENCOUNTER — Ambulatory Visit: Payer: PPO

## 2021-06-22 DIAGNOSIS — M5432 Sciatica, left side: Secondary | ICD-10-CM | POA: Diagnosis not present

## 2021-06-22 DIAGNOSIS — D0512 Intraductal carcinoma in situ of left breast: Secondary | ICD-10-CM | POA: Diagnosis not present

## 2021-06-24 DIAGNOSIS — N8501 Benign endometrial hyperplasia: Secondary | ICD-10-CM | POA: Diagnosis not present

## 2021-06-24 DIAGNOSIS — C50112 Malignant neoplasm of central portion of left female breast: Secondary | ICD-10-CM | POA: Diagnosis not present

## 2021-06-29 ENCOUNTER — Encounter (INDEPENDENT_AMBULATORY_CARE_PROVIDER_SITE_OTHER): Payer: PPO | Admitting: Ophthalmology

## 2021-06-29 ENCOUNTER — Other Ambulatory Visit: Payer: Self-pay

## 2021-06-29 DIAGNOSIS — H43813 Vitreous degeneration, bilateral: Secondary | ICD-10-CM

## 2021-06-29 DIAGNOSIS — H353231 Exudative age-related macular degeneration, bilateral, with active choroidal neovascularization: Secondary | ICD-10-CM | POA: Diagnosis not present

## 2021-07-01 DIAGNOSIS — M5451 Vertebrogenic low back pain: Secondary | ICD-10-CM | POA: Diagnosis not present

## 2021-07-14 ENCOUNTER — Encounter: Payer: Self-pay | Admitting: Radiation Oncology

## 2021-07-17 NOTE — Progress Notes (Incomplete)
  Radiation Oncology         (336) [phone] ________________________________  Patient Name: Jessica Livingston MRN: [id number] DOB: March 04, 1941 Referring Physician: Deland Pretty (Profile Not Attached) Date of Service: 06/18/2021 [facility], [place]  End Of Treatment Note  Diagnoses: D05.12-Intraductal carcinoma in situ of left breast  Cancer Staging:  Stage 0 (cTis (DCIS), cN0, cM0) Left Breast, High-Grade Ductal Carcinoma in-situ, ER+ / PR+   Intent: Curative  Radiation Treatment Dates: 05/21/2021 through 06/18/2021 Site Technique Total Dose (Gy) Dose per Fx (Gy) Completed Fx Beam Energies  Breast, Left: Breast_Lt 3D 40.05/40.05 2.67 15/15 6X, 10X  Breast, Left: Breast_Lt_Bst 3D 10/10 2 5/5 6X   Narrative: The patient tolerated radiation therapy relatively well. She reports chaffing underneath left breast that is tender and mild fatigue. Patient reports a good range of motion. Continues to use Radiaplex 1-2 times daily. Pinkness noted to chest, breast and left axilla. Desquamation present underneath left breast. Encouraged patient to use Radiaplex twice daily as well as neosporin plus to the chaffed area. Denies itching or burning in treatment field. Appetite is poor. Patient is grieving death of husband in 2022/12/24.   Plan: The patient will follow-up with radiation oncology in one month .  ________________________________________________ -----------------------------------  Blair Promise, PhD, MD  This document serves as a record of services personally performed by Gery Pray, MD. It was created on his behalf by Roney Mans, a trained medical scribe. The creation of this record is based on the scribe's personal observations and the provider's statements to them. This document has been checked and approved by the attending provider.

## 2021-07-19 NOTE — Progress Notes (Signed)
Radiation Oncology         (336) [phone] ________________________________  Name: Jessica Livingston MRN: [id number]  Date: 07/20/2021  DOB: Apr 28, 1941  Follow-Up Visit Note  CC: Deland Pretty, MD  Deland Pretty, MD    ICD-10-CM   1. 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  The patient tolerated radiation therapy relatively well. She reported tender chaffing underneath her left breast and mild fatigue. Patient continued to use Radiaplex 1-2 times daily. Physical exam performed during final weekly treatment checkup revealed pinkness to her chest, breast and left axilla. Desquamation present underneath left breast. I encouraged the patient to use Radiaplex twice daily as well as neosporin plus to the chaffed area. Patient was also noted to have a poor appetite. (Patient is grieving death of her husband who passed in April).         While receiving radiation treatment, the patient followed up with Dr. Lindi Adie on 06/08/21 to discuss antiestrogen therapy. Following discussion of treatment options, the patient opted to not proceed with Letrozole 5 years. Of note: patient had uterine polyps and has an IUD in place to combat that.                            Allergies:  is allergic to atorvastatin and rosuvastatin.  Meds: Current Outpatient Medications  Medication Sig Dispense Refill   Besifloxacin HCl (BESIVANCE) 0.6 % SUSP Place 1 drop into both eyes See admin instructions. Instill 1 drop into both eyes 3  times daily once a month on the day of monthly eye injections     Cyanocobalamin (VITAMIN B-12 PO) Take 1 capsule by mouth daily.     VITAMIN D PO Take 1 capsule by mouth daily.     No current facility-administered medications for this encounter.    Physical Findings: The patient is in no acute distress. Patient is alert and oriented.  height is 5\' 3"  (1.6 m) and weight is 208 lb 8 oz (94.6 kg). Her temporal temperature is 97.7 F (36.5 C). Her blood pressure is 153/81 (abnormal) and her pulse is 65. Her respiration is 18 and oxygen saturation is 97%. .   Lungs are clear to auscultation bilaterally. Heart has regular rate and rhythm. No palpable cervical, supraclavicular, or axillary adenopathy. Abdomen soft, non-tender, normal bowel sounds.   Right Breast: no palpable mass, nipple discharge or bleeding.  The left breast area shows some continued hyper pigmentation changes in the central breast area.  Skin is healed well at this time.  Mild edema noted.  No dominant mass appreciated in the breast nipple discharge or bleeding.   Lab Findings: Lab Results  Component Value Date   WBC 6.3 04/08/2021   HGB 15.4 (H) 04/08/2021   HCT 47.2 (H) 04/08/2021   MCV 94.4 04/08/2021   PLT 220 04/08/2021    Radiographic Findings: No results found.  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  No evidence of recurrence on clinical exam today  Plan: The patient will follow-up in 6 months.  If she is seen on a regular basis by her surgeon then I will likely defer follow-up at that point.  Unclear if she will be following up with medical oncology.  She does have a appointment with survivorship.   ____________________________________  Blair Promise, PhD, MD   This document serves as a record of services personally performed by Gery Pray, MD. It was created on his behalf by  Roney Mans, a trained medical scribe. The creation of this record is based on the scribe's personal observations and the provider's statements to them. This document has been checked and approved by the attending provider.

## 2021-07-20 ENCOUNTER — Other Ambulatory Visit: Payer: Self-pay

## 2021-07-20 ENCOUNTER — Ambulatory Visit
Admission: RE | Admit: 2021-07-20 | Discharge: 2021-07-20 | Disposition: A | Discharge status: Home / Self Care | Payer: PPO | Source: Ambulatory Visit | Attending: Radiation Oncology | Admitting: Radiation Oncology

## 2021-07-20 ENCOUNTER — Encounter: Payer: Self-pay | Admitting: Radiation Oncology

## 2021-07-20 VITALS — BP 153/81 | HR 65 | Temp 97.7°F | Resp 18 | Ht 63.0 in | Wt 208.5 lb

## 2021-07-20 DIAGNOSIS — N840 Polyp of corpus uteri: Secondary | ICD-10-CM | POA: Insufficient documentation

## 2021-07-20 DIAGNOSIS — R630 Anorexia: Secondary | ICD-10-CM | POA: Diagnosis not present

## 2021-07-20 DIAGNOSIS — Z923 Personal history of irradiation: Secondary | ICD-10-CM | POA: Insufficient documentation

## 2021-07-20 DIAGNOSIS — Z86000 Personal history of in-situ neoplasm of breast: Secondary | ICD-10-CM | POA: Diagnosis not present

## 2021-07-20 DIAGNOSIS — D0512 Intraductal carcinoma in situ of left breast: Secondary | ICD-10-CM

## 2021-07-20 HISTORY — DX: Personal history of irradiation: Z92.3

## 2021-07-20 NOTE — Progress Notes (Signed)
Jessica Livingston is here today for follow up post radiation to the breast.   Breast Side:left   They completed their radiation on: 06/18/2021   Does the patient complain of any of the following: Post radiation skin issues: denies Breast Tenderness: denies Breast Swelling: denies Lymphadema: denies Range of Motion limitations: demonstrates full range of motion Fatigue post radiation: mild Appetite good/fair/poor: fair  Additional comments if applicable: depression due to loss of husband and diagnosis  Vitals:   07/20/21 1027  BP: (!) 153/81  Pulse: 65  Resp: 18  Temp: 97.7 F (36.5 C)  TempSrc: Temporal  SpO2: 97%  Weight: 208 lb 8 oz (94.6 kg)  Height: 5\' 3"  (1.6 m)

## 2021-07-27 ENCOUNTER — Other Ambulatory Visit: Payer: Self-pay

## 2021-07-27 ENCOUNTER — Encounter (INDEPENDENT_AMBULATORY_CARE_PROVIDER_SITE_OTHER): Payer: PPO | Admitting: Ophthalmology

## 2021-07-27 DIAGNOSIS — H43813 Vitreous degeneration, bilateral: Secondary | ICD-10-CM | POA: Diagnosis not present

## 2021-07-27 DIAGNOSIS — H353231 Exudative age-related macular degeneration, bilateral, with active choroidal neovascularization: Secondary | ICD-10-CM

## 2021-08-31 ENCOUNTER — Other Ambulatory Visit: Payer: Self-pay

## 2021-08-31 ENCOUNTER — Encounter (INDEPENDENT_AMBULATORY_CARE_PROVIDER_SITE_OTHER): Payer: PPO | Admitting: Ophthalmology

## 2021-08-31 DIAGNOSIS — H43813 Vitreous degeneration, bilateral: Secondary | ICD-10-CM | POA: Diagnosis not present

## 2021-08-31 DIAGNOSIS — H353231 Exudative age-related macular degeneration, bilateral, with active choroidal neovascularization: Secondary | ICD-10-CM

## 2021-08-31 DIAGNOSIS — H35371 Puckering of macula, right eye: Secondary | ICD-10-CM

## 2021-09-03 ENCOUNTER — Encounter (INDEPENDENT_AMBULATORY_CARE_PROVIDER_SITE_OTHER): Payer: PPO | Admitting: Ophthalmology

## 2021-09-04 ENCOUNTER — Telehealth: Payer: Self-pay | Admitting: *Deleted

## 2021-09-08 ENCOUNTER — Inpatient Hospital Stay: Payer: PPO | Admitting: Adult Health

## 2021-09-18 ENCOUNTER — Other Ambulatory Visit: Payer: Self-pay

## 2021-09-18 ENCOUNTER — Encounter: Payer: Self-pay | Admitting: *Deleted

## 2021-09-18 ENCOUNTER — Inpatient Hospital Stay: Payer: PPO | Attending: Hematology and Oncology | Admitting: *Deleted

## 2021-09-18 DIAGNOSIS — D0512 Intraductal carcinoma in situ of left breast: Secondary | ICD-10-CM

## 2021-09-18 NOTE — Progress Notes (Signed)
°  2 Identifiers were used for verification purposes for this visit.

## 2021-09-18 NOTE — Addendum Note (Signed)
Addended by: Rea [employer] D on: 09/18/2021 01:00 PM   Modules accepted: Orders

## 2021-09-18 NOTE — Progress Notes (Signed)
2 Identifiers were used for verification purposes for this visit. No vital signs was taken as this was a telephone visit. SCP reviewed and completed.SDOH assessed and completed. Pt  denies pain. Pt is doing very well. She recently bought a treadmill to begin exercising.  Pt will see her PCP for annual physical in February.Pt will Dr. Lindi Adie in May. Pt is scheduled for mammogram in June at [facility]. She continues to see her gynecologist every 6 months due to her hx of uterine polyps. Last colonscopy was in 2021 which may be her last due to her age according to the summary note. Last bone density was in 2022. Pt did receive flu vaccine for 2022. No recent Covid booster to report.

## 2021-10-01 DIAGNOSIS — E7801 Familial hypercholesterolemia: Secondary | ICD-10-CM | POA: Diagnosis not present

## 2021-10-01 DIAGNOSIS — E538 Deficiency of other specified B group vitamins: Secondary | ICD-10-CM | POA: Diagnosis not present

## 2021-10-01 DIAGNOSIS — E559 Vitamin D deficiency, unspecified: Secondary | ICD-10-CM | POA: Diagnosis not present

## 2021-10-01 DIAGNOSIS — Z0000 Encounter for general adult medical examination without abnormal findings: Secondary | ICD-10-CM | POA: Diagnosis not present

## 2021-10-01 DIAGNOSIS — N390 Urinary tract infection, site not specified: Secondary | ICD-10-CM | POA: Diagnosis not present

## 2021-10-05 ENCOUNTER — Encounter (INDEPENDENT_AMBULATORY_CARE_PROVIDER_SITE_OTHER): Payer: PPO | Admitting: Ophthalmology

## 2021-10-05 ENCOUNTER — Other Ambulatory Visit: Payer: Self-pay

## 2021-10-05 DIAGNOSIS — H43813 Vitreous degeneration, bilateral: Secondary | ICD-10-CM

## 2021-10-05 DIAGNOSIS — H353231 Exudative age-related macular degeneration, bilateral, with active choroidal neovascularization: Secondary | ICD-10-CM | POA: Diagnosis not present

## 2021-10-06 DIAGNOSIS — Z0000 Encounter for general adult medical examination without abnormal findings: Secondary | ICD-10-CM | POA: Diagnosis not present

## 2021-10-06 DIAGNOSIS — E538 Deficiency of other specified B group vitamins: Secondary | ICD-10-CM | POA: Diagnosis not present

## 2021-10-06 DIAGNOSIS — E7800 Pure hypercholesterolemia, unspecified: Secondary | ICD-10-CM | POA: Diagnosis not present

## 2021-10-06 DIAGNOSIS — R7301 Impaired fasting glucose: Secondary | ICD-10-CM | POA: Diagnosis not present

## 2021-10-06 DIAGNOSIS — Z8249 Family history of ischemic heart disease and other diseases of the circulatory system: Secondary | ICD-10-CM | POA: Diagnosis not present

## 2021-10-06 DIAGNOSIS — E559 Vitamin D deficiency, unspecified: Secondary | ICD-10-CM | POA: Diagnosis not present

## 2021-10-06 DIAGNOSIS — Z853 Personal history of malignant neoplasm of breast: Secondary | ICD-10-CM | POA: Diagnosis not present

## 2021-10-06 DIAGNOSIS — E039 Hypothyroidism, unspecified: Secondary | ICD-10-CM | POA: Diagnosis not present

## 2021-10-28 DIAGNOSIS — H11153 Pinguecula, bilateral: Secondary | ICD-10-CM | POA: Diagnosis not present

## 2021-10-28 DIAGNOSIS — H35723 Serous detachment of retinal pigment epithelium, bilateral: Secondary | ICD-10-CM | POA: Diagnosis not present

## 2021-10-28 DIAGNOSIS — D492 Neoplasm of unspecified behavior of bone, soft tissue, and skin: Secondary | ICD-10-CM | POA: Diagnosis not present

## 2021-10-28 DIAGNOSIS — H33001 Unspecified retinal detachment with retinal break, right eye: Secondary | ICD-10-CM | POA: Diagnosis not present

## 2021-10-28 DIAGNOSIS — Z961 Presence of intraocular lens: Secondary | ICD-10-CM | POA: Diagnosis not present

## 2021-11-02 ENCOUNTER — Other Ambulatory Visit: Payer: Self-pay

## 2021-11-02 ENCOUNTER — Encounter (INDEPENDENT_AMBULATORY_CARE_PROVIDER_SITE_OTHER): Payer: PPO | Admitting: Ophthalmology

## 2021-11-02 DIAGNOSIS — H43813 Vitreous degeneration, bilateral: Secondary | ICD-10-CM

## 2021-11-02 DIAGNOSIS — H353231 Exudative age-related macular degeneration, bilateral, with active choroidal neovascularization: Secondary | ICD-10-CM

## 2021-11-30 ENCOUNTER — Encounter (INDEPENDENT_AMBULATORY_CARE_PROVIDER_SITE_OTHER): Payer: PPO | Admitting: Ophthalmology

## 2021-11-30 DIAGNOSIS — H43813 Vitreous degeneration, bilateral: Secondary | ICD-10-CM | POA: Diagnosis not present

## 2021-11-30 DIAGNOSIS — H35371 Puckering of macula, right eye: Secondary | ICD-10-CM

## 2021-11-30 DIAGNOSIS — H353231 Exudative age-related macular degeneration, bilateral, with active choroidal neovascularization: Secondary | ICD-10-CM

## 2021-12-03 DIAGNOSIS — E039 Hypothyroidism, unspecified: Secondary | ICD-10-CM | POA: Diagnosis not present

## 2021-12-03 DIAGNOSIS — E559 Vitamin D deficiency, unspecified: Secondary | ICD-10-CM | POA: Diagnosis not present

## 2021-12-03 DIAGNOSIS — E538 Deficiency of other specified B group vitamins: Secondary | ICD-10-CM | POA: Diagnosis not present

## 2021-12-03 DIAGNOSIS — E7801 Familial hypercholesterolemia: Secondary | ICD-10-CM | POA: Diagnosis not present

## 2021-12-03 DIAGNOSIS — R7301 Impaired fasting glucose: Secondary | ICD-10-CM | POA: Diagnosis not present

## 2021-12-14 DIAGNOSIS — E038 Other specified hypothyroidism: Secondary | ICD-10-CM | POA: Diagnosis not present

## 2021-12-14 DIAGNOSIS — E538 Deficiency of other specified B group vitamins: Secondary | ICD-10-CM | POA: Diagnosis not present

## 2021-12-14 DIAGNOSIS — E559 Vitamin D deficiency, unspecified: Secondary | ICD-10-CM | POA: Diagnosis not present

## 2021-12-16 ENCOUNTER — Encounter (HOSPITAL_COMMUNITY): Payer: Self-pay

## 2021-12-28 ENCOUNTER — Encounter (INDEPENDENT_AMBULATORY_CARE_PROVIDER_SITE_OTHER): Payer: PPO | Admitting: Ophthalmology

## 2021-12-28 DIAGNOSIS — H353231 Exudative age-related macular degeneration, bilateral, with active choroidal neovascularization: Secondary | ICD-10-CM | POA: Diagnosis not present

## 2021-12-28 DIAGNOSIS — H43813 Vitreous degeneration, bilateral: Secondary | ICD-10-CM

## 2022-01-15 NOTE — Progress Notes (Signed)
Radiation Oncology         [phone] ________________________________  Name: Jessica Livingston MRN: [id number]  Date: 01/18/2022  DOB: 1940-11-20  Follow-Up Visit Note  CC: Deland Pretty, MD  Deland Pretty, MD    ICD-10-CM   1. 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 Since her last visit,  the patient followed up with with oncology, [employer] RN, via telephone on 09/18/21. During which time, the patient was noted to be feeling well and denied any pain. The patient will meet with Dr. Lindi Adie later this month for routine follow-up. She continues to follow with her gynecologist for her history or uterine polyps.        Otherwise, no significant interval history since the patient was last seen.   She denies any nipple discharge or bleeding.  She denies any pain within the left breast.  She denies any problems with swelling in her left arm or hand.                       Allergies:  is allergic to atorvastatin and rosuvastatin.  Meds: Current Outpatient Medications  Medication Sig Dispense Refill   Besifloxacin HCl (BESIVANCE) 0.6 % SUSP Place 1 drop into both eyes See admin instructions. Instill 1 drop into both eyes 3 times daily once a month on the day of monthly eye injections     Cyanocobalamin (VITAMIN B-12 PO) Take 1 capsule by mouth daily.     VITAMIN D PO Take 1 capsule by mouth daily.     No current facility-administered  medications for this encounter.    Physical Findings: The patient is in no acute distress. Patient is alert and oriented.  height is '5\' 3"'$  (1.6 m) and weight is 209 lb 2 oz (94.9 kg). Her temporal temperature is 97.1 F (36.2 C) (abnormal). Her blood pressure is 156/67 (abnormal) and her pulse is 57 (abnormal). Her respiration is 18 and oxygen saturation is 96%.  Lungs are clear to auscultation bilaterally. Heart has regular rate and rhythm. No palpable cervical, supraclavicular, or axillary adenopathy. Abdomen soft, non-tender, normal bowel sounds.  Left Breast: no palpable mass, nipple discharge or bleeding.  Mild hyperpigmentation changes noted in the breast.  Examination of the right breast reveals no palpable mass nipple discharge or bleeding.    Lab Findings: Lab Results  Component Value Date   WBC 6.3 04/08/2021   HGB 15.4 (H) 04/08/2021   HCT 47.2 (H) 04/08/2021   MCV 94.4 04/08/2021   PLT 220 04/08/2021    Radiographic Findings: No results found.  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  The patient does not appear to be exhibiting any aftereffects from her breast radiation treatment.  Plan: Routine follow-up in 6 months.  She  is not following up with any other physicians.  I have asked that she forward her upcoming mammogram report to our office.   15 minutes of total time was spent for this patient encounter, including preparation, face-to-face counseling with the patient and coordination of care, physical exam, and documentation of the encounter. ____________________________________  Blair Promise, PhD, MD  This document serves as a record of services personally performed by Gery Pray, MD. It was created on his behalf by Roney Mans, a trained medical scribe. The creation of this record is based on the scribe's personal observations and the  provider's statements to them. This document has been checked and approved by the attending provider.

## 2022-01-18 ENCOUNTER — Other Ambulatory Visit: Payer: Self-pay

## 2022-01-18 ENCOUNTER — Ambulatory Visit
Admission: RE | Admit: 2022-01-18 | Discharge: 2022-01-18 | Disposition: A | Discharge status: Home / Self Care | Payer: PPO | Source: Ambulatory Visit | Attending: Radiation Oncology | Admitting: Radiation Oncology

## 2022-01-18 ENCOUNTER — Encounter: Payer: Self-pay | Admitting: Radiation Oncology

## 2022-01-18 DIAGNOSIS — Z170 Estrogen receptor positive status [ER+]: Secondary | ICD-10-CM | POA: Diagnosis not present

## 2022-01-18 DIAGNOSIS — D0512 Intraductal carcinoma in situ of left breast: Secondary | ICD-10-CM

## 2022-01-18 DIAGNOSIS — Z923 Personal history of irradiation: Secondary | ICD-10-CM | POA: Insufficient documentation

## 2022-01-18 DIAGNOSIS — Z86000 Personal history of in-situ neoplasm of breast: Secondary | ICD-10-CM | POA: Diagnosis not present

## 2022-01-18 NOTE — Progress Notes (Signed)
Jessica Livingston is here today for follow up post radiation to the breast.   Breast Side:Left   They completed their radiation on: 06/18/21   Does the patient complain of any of the following: Post radiation skin issues: No Breast Tenderness: No Breast Swelling: No Lymphadema: No Range of Motion limitations: No Fatigue post radiation: No Appetite good/fair/poor: Good  Additional comments if applicable:   BP (!) 676/72 (BP Location: Right Arm, Patient Position: Sitting)   Pulse (!) 57   Temp (!) 97.1 F (36.2 C) (Temporal)   Resp 18   Ht '5\' 3"'$  (1.6 m)   Wt 209 lb 2 oz (94.9 kg)   SpO2 96%   BMI 37.04 kg/m

## 2022-01-27 ENCOUNTER — Ambulatory Visit
Admission: EM | Admit: 2022-01-27 | Discharge: 2022-01-27 | Disposition: A | Discharge status: Home / Self Care | Payer: PPO | Attending: Urgent Care | Admitting: Urgent Care

## 2022-01-27 ENCOUNTER — Ambulatory Visit (INDEPENDENT_AMBULATORY_CARE_PROVIDER_SITE_OTHER): Payer: PPO

## 2022-01-27 DIAGNOSIS — R1012 Left upper quadrant pain: Secondary | ICD-10-CM

## 2022-01-27 NOTE — ED Provider Notes (Signed)
[facility]    CSN: [id number] Arrival date & time: 01/27/22  0092      History   Chief Complaint Chief Complaint  Patient presents with   LUQ pain    HPI Jessica Livingston is a 81 y.o. female.   Pleasant 81 year old female with a non-contributory past medical history presents today with a 1 month history of left upper quadrant pain.  She states it is dull in nature and seems to come and go, worse at nighttime when lying on that side.  She reports that her entire upper abdomen feels "tight".  She does have a history of a lap band placed in 2011.  She had checked for 2 years after the surgery, but has not had it checked since and is concerned it may be related.  She denies any heartburn.  She denies any dysphagia or food getting stuck.  She denies any change in bowel movements specifically no constipation or diarrhea.  She has not had any nausea or vomiting.  She does not smoke or drink alcohol.  She denies any recurrent NSAID use.  Her appetite has not been affected.  Denies CP, SOB, palpitations, L shoulder pain, jaw pain or radicular symptoms. She is here today under the instruction from her PCP.  She has not tried any treatments as she thought it would "go away".  She denies any alleviating or aggravating factors. She does have a hx of ductal carcinoma breast cancer on the L treated with radiation therapy, last radiation tx was in October 2022.     Past Medical History:  Diagnosis Date   CAD (coronary artery disease)    Cancer ([place])    Cataract 2018   had sx   Depression    Heart disease '   History of radiation therapy    Left breast 05/21/21-06/18/21- Dr. Gery Pray   HLD (hyperlipidemia)    Obesity, unspecified    Other abnormal glucose    Other B-complex deficiencies    Other seborrheic keratosis    Postmenopausal     Patient Active Problem List   Diagnosis Date Noted   Ductal carcinoma in situ (DCIS) of left breast 02/20/2021   Nonspecific abnormal  finding in stool contents 02/17/2017   History of colonic polyps 02/17/2017   Tick bite of back 01/08/2013   Low back pain 02/07/2012   VITAMIN B12 DEFICIENCY 10/20/2010   BARIATRIC SURGERY STATUS 09/08/2010   SEBORRHEIC KERATOSIS 09/08/2010   OBESITY 07/15/2009   HYPERGLYCEMIA 10/10/2007   HYPERLIPIDEMIA 07/13/2007   DEPRESSION 07/13/2007   CORONARY ARTERY DISEASE 07/13/2007    Past Surgical History:  Procedure Laterality Date   BREAST LUMPECTOMY WITH RADIOACTIVE SEED LOCALIZATION Left 04/16/2021   Procedure: LEFT BREAST LUMPECTOMY WITH RADIOACTIVE SEED LOCALIZATION;  Surgeon: Jovita Kussmaul, MD;  Location: [place];  Service: General;  Laterality: Left;   CARDIAC CATHETERIZATION  5/03   mild to mod single vessel disease   CHOLECYSTECTOMY     COLONOSCOPY  2018   HPP/TA   LAPAROSCOPIC GASTRIC BANDING  01/13/2010   NECK SURGERY     POLYPECTOMY  2018   TONSILLECTOMY     TUBAL LIGATION Bilateral     OB History   No obstetric history on file.      Home Medications    Prior to Admission medications   Medication Sig Start Date End Date Taking? Authorizing Provider  Besifloxacin HCl (BESIVANCE) 0.6 % SUSP Place 1 drop into both eyes See admin instructions. Instill  1 drop into both eyes 3 times daily once a month on the day of monthly eye injections    [provider]  Cyanocobalamin (VITAMIN B-12 PO) Take 1 capsule by mouth daily.    [provider]  VITAMIN D PO Take 1 capsule by mouth daily.    [provider]    Family History Family History  Problem Relation Age of Onset   Heart attack Father 79   Heart disease Father    Aneurysm Father 45   Breast cancer Mother 1   Thyroid disease Mother 42   Hypertension Mother    Hyperlipidemia Mother    Lung cancer Mother 91   Kidney disease Mother 4   Diabetes Paternal Grandmother    Diabetes Brother    Colon polyps Neg Hx    Colon cancer Neg Hx    Esophageal cancer Neg Hx    Rectal cancer Neg Hx     Stomach cancer Neg Hx     Social History Social History   Tobacco Use   Smoking status: Never   Smokeless tobacco: Never  Vaping Use   Vaping Use: Never used  Substance Use Topics   Alcohol use: Yes    Comment: rarely    Drug use: No     Allergies   Atorvastatin and Rosuvastatin   Review of Systems Review of Systems  Gastrointestinal:  Positive for abdominal pain (LUQ).  All other systems reviewed and are negative.   Physical Exam Triage Vital Signs ED Triage Vitals  Enc Vitals Group     BP 01/27/22 1038 (!) 149/75     Pulse Rate 01/27/22 1038 73     Resp 01/27/22 1038 18     Temp 01/27/22 1038 98.1 F (36.7 C)     Temp Source 01/27/22 1038 Oral     SpO2 01/27/22 1038 90 %     Weight --      Height --      Head Circumference --      Peak Flow --      Pain Score 01/27/22 1037 0     Pain Loc --      Pain Edu? --      Excl. in [place]? --    No data found.  Updated Vital Signs BP (!) 149/75 (BP Location: Left Arm)   Pulse 73   Temp 98.1 F (36.7 C) (Oral)   Resp 18   SpO2 90%   Visual Acuity Right Eye Distance:   Left Eye Distance:   Bilateral Distance:    Right Eye Near:   Left Eye Near:    Bilateral Near:     Physical Exam Vitals and nursing note reviewed.  Constitutional:      General: She is not in acute distress.    Appearance: Normal appearance. She is obese. She is not ill-appearing, toxic-appearing or diaphoretic.  HENT:     Head: Normocephalic and atraumatic.  Cardiovascular:     Rate and Rhythm: Normal rate and regular rhythm.     Pulses: Normal pulses.  Pulmonary:     Effort: Pulmonary effort is normal. No respiratory distress.     Breath sounds: Normal breath sounds. No stridor. No wheezing, rhonchi or rales.  Chest:     Chest wall: No tenderness.  Abdominal:     General: Abdomen is flat. Bowel sounds are normal. There is no distension.     Palpations: Abdomen is soft. There is no mass.     Tenderness:  There is no abdominal  tenderness. There is no right CVA tenderness, left CVA tenderness, guarding or rebound.     Hernia: No hernia is present.     Comments: No reproducible tenderness Negative splenic percussion sign Scattered Sks to anterior abdomen Varicosity noted to L lateral abdomen superficially   Musculoskeletal:     Cervical back: Normal range of motion and neck supple. No rigidity or tenderness.  Lymphadenopathy:     Cervical: No cervical adenopathy.  Skin:    General: Skin is warm and dry.     Capillary Refill: Capillary refill takes less than 2 seconds.     Findings: No erythema or rash.  Neurological:     General: No focal deficit present.     Mental Status: She is alert and oriented to person, place, and time.     UC Treatments / Results  Labs (all labs ordered are listed, but only abnormal results are displayed) Labs Reviewed - No data to display  EKG   Radiology DG Abd 2 Views  Result Date: 01/27/2022 CLINICAL DATA:  Left upper quadrant pain. EXAM: ABDOMEN - 2 VIEW COMPARISON:  01/14/2010 FINDINGS: Normal bowel gas pattern. Mild stool in the right colon. No urinary tract calculi. Surgical clips in the gallbladder fossa. Gastric banding unchanged in position. IMPRESSION: Negative. Electronically Signed   By: Franchot Gallo M.D.   On: 01/27/2022 11:45    Procedures Procedures (including critical care time)  Medications Ordered in UC Medications - No data to display  Initial Impression / Assessment and Plan / UC Course  I have reviewed the triage vital signs and the nursing notes.  Pertinent labs & imaging results that were available during my care of the patient were reviewed by me and considered in my medical decision making (see chart for details).     LUQ pain - uncertain cause. Xray unrevealing, lap band appears to be in appropriate anatomical location. Question if this could be related to side effect from recent radiation therapy. Pt has no red flag s/sx and VSS.  Recommended pt try moist heat to affected area and tylenol to see if possibly muscular in nature. Call oncology for their recs, and follow up with PCP for further workup should symptoms persist.  Final Clinical Impressions(s) / UC Diagnoses   Final diagnoses:  LUQ pain     Discharge Instructions      Your x-ray does not show any acute findings. Please discuss this with your oncologist, to determine if this could be a possible side effect of radiation therapy. Please apply a moist warm compress to the affected area to see if this might be a muscular cause. Avoid anti-inflammatory such as ibuprofen or Motrin, and try Tylenol several times daily.  Follow-up with your PCP if symptoms persist. If you develop nausea, vomiting, fever, or worsening pain, please had to the emergency room      ED Prescriptions   None    PDMP not reviewed this encounter.   Chaney Malling, [place] 01/27/22 2106

## 2022-01-27 NOTE — Discharge Instructions (Signed)
Your x-ray does not show any acute findings. Please discuss this with your oncologist, to determine if this could be a possible side effect of radiation therapy. Please apply a moist warm compress to the affected area to see if this might be a muscular cause. Avoid anti-inflammatory such as ibuprofen or Motrin, and try Tylenol several times daily.  Follow-up with your PCP if symptoms persist. If you develop nausea, vomiting, fever, or worsening pain, please had to the emergency room

## 2022-01-27 NOTE — ED Triage Notes (Signed)
Pt c/o LUQ pain that is generalized.  Onset ~ 1 month intermittently

## 2022-02-01 ENCOUNTER — Encounter (INDEPENDENT_AMBULATORY_CARE_PROVIDER_SITE_OTHER): Payer: PPO | Admitting: Ophthalmology

## 2022-02-01 DIAGNOSIS — H353231 Exudative age-related macular degeneration, bilateral, with active choroidal neovascularization: Secondary | ICD-10-CM

## 2022-02-01 DIAGNOSIS — H43813 Vitreous degeneration, bilateral: Secondary | ICD-10-CM

## 2022-02-04 DIAGNOSIS — Z853 Personal history of malignant neoplasm of breast: Secondary | ICD-10-CM | POA: Diagnosis not present

## 2022-02-22 ENCOUNTER — Other Ambulatory Visit: Payer: Self-pay | Admitting: Registered Nurse

## 2022-02-22 DIAGNOSIS — Z86010 Personal history of colonic polyps: Secondary | ICD-10-CM | POA: Diagnosis not present

## 2022-02-22 DIAGNOSIS — Z853 Personal history of malignant neoplasm of breast: Secondary | ICD-10-CM | POA: Diagnosis not present

## 2022-02-22 DIAGNOSIS — Z9884 Bariatric surgery status: Secondary | ICD-10-CM | POA: Diagnosis not present

## 2022-02-22 DIAGNOSIS — K5730 Diverticulosis of large intestine without perforation or abscess without bleeding: Secondary | ICD-10-CM | POA: Diagnosis not present

## 2022-02-22 DIAGNOSIS — R1010 Upper abdominal pain, unspecified: Secondary | ICD-10-CM | POA: Diagnosis not present

## 2022-02-27 DIAGNOSIS — C259 Malignant neoplasm of pancreas, unspecified: Secondary | ICD-10-CM

## 2022-02-27 HISTORY — DX: Malignant neoplasm of pancreas, unspecified: C25.9

## 2022-03-01 ENCOUNTER — Encounter (INDEPENDENT_AMBULATORY_CARE_PROVIDER_SITE_OTHER): Payer: PPO | Admitting: Ophthalmology

## 2022-03-01 DIAGNOSIS — H43813 Vitreous degeneration, bilateral: Secondary | ICD-10-CM | POA: Diagnosis not present

## 2022-03-01 DIAGNOSIS — H353231 Exudative age-related macular degeneration, bilateral, with active choroidal neovascularization: Secondary | ICD-10-CM

## 2022-03-04 ENCOUNTER — Ambulatory Visit
Admission: RE | Admit: 2022-03-04 | Discharge: 2022-03-04 | Disposition: A | Discharge status: Home / Self Care | Payer: PPO | Source: Ambulatory Visit | Attending: Registered Nurse | Admitting: Registered Nurse

## 2022-03-04 DIAGNOSIS — K7290 Hepatic failure, unspecified without coma: Secondary | ICD-10-CM | POA: Diagnosis not present

## 2022-03-04 DIAGNOSIS — D739 Disease of spleen, unspecified: Secondary | ICD-10-CM | POA: Diagnosis not present

## 2022-03-04 DIAGNOSIS — K8689 Other specified diseases of pancreas: Secondary | ICD-10-CM | POA: Diagnosis not present

## 2022-03-04 DIAGNOSIS — R1010 Upper abdominal pain, unspecified: Secondary | ICD-10-CM

## 2022-03-04 DIAGNOSIS — R160 Hepatomegaly, not elsewhere classified: Secondary | ICD-10-CM | POA: Diagnosis not present

## 2022-03-04 MED ORDER — IOPAMIDOL (ISOVUE-300) INJECTION 61%
100.0000 mL | Freq: Once | INTRAVENOUS | Status: AC | PRN
  Administered 2022-03-04: 100 mL via INTRAVENOUS

## 2022-03-09 ENCOUNTER — Other Ambulatory Visit: Payer: Self-pay | Admitting: Registered Nurse

## 2022-03-09 ENCOUNTER — Ambulatory Visit: Payer: PPO | Admitting: Gastroenterology

## 2022-03-09 ENCOUNTER — Telehealth: Payer: Self-pay | Admitting: Hematology and Oncology

## 2022-03-09 ENCOUNTER — Other Ambulatory Visit (INDEPENDENT_AMBULATORY_CARE_PROVIDER_SITE_OTHER): Payer: PPO

## 2022-03-09 ENCOUNTER — Encounter: Payer: Self-pay | Admitting: Gastroenterology

## 2022-03-09 VITALS — BP 140/70 | HR 64 | Ht 63.0 in | Wt 203.0 lb

## 2022-03-09 DIAGNOSIS — K7460 Unspecified cirrhosis of liver: Secondary | ICD-10-CM | POA: Diagnosis not present

## 2022-03-09 DIAGNOSIS — Z853 Personal history of malignant neoplasm of breast: Secondary | ICD-10-CM | POA: Diagnosis not present

## 2022-03-09 DIAGNOSIS — K8689 Other specified diseases of pancreas: Secondary | ICD-10-CM

## 2022-03-09 DIAGNOSIS — K760 Fatty (change of) liver, not elsewhere classified: Secondary | ICD-10-CM | POA: Diagnosis not present

## 2022-03-09 DIAGNOSIS — R935 Abnormal findings on diagnostic imaging of other abdominal regions, including retroperitoneum: Secondary | ICD-10-CM | POA: Insufficient documentation

## 2022-03-09 DIAGNOSIS — C259 Malignant neoplasm of pancreas, unspecified: Secondary | ICD-10-CM | POA: Diagnosis not present

## 2022-03-09 DIAGNOSIS — R1084 Generalized abdominal pain: Secondary | ICD-10-CM | POA: Diagnosis not present

## 2022-03-09 DIAGNOSIS — I2510 Atherosclerotic heart disease of native coronary artery without angina pectoris: Secondary | ICD-10-CM | POA: Diagnosis not present

## 2022-03-09 LAB — CBC WITH DIFFERENTIAL/PLATELET
Basophils Absolute: 0 10*3/uL (ref 0.0–0.1)
Basophils Relative: 0.8 % (ref 0.0–3.0)
Eosinophils Absolute: 0.1 10*3/uL (ref 0.0–0.7)
Eosinophils Relative: 1.9 % (ref 0.0–5.0)
HCT: 42.4 % (ref 36.0–46.0)
Hemoglobin: 14.3 g/dL (ref 12.0–15.0)
Lymphocytes Relative: 19.8 % (ref 12.0–46.0)
Lymphs Abs: 1.1 10*3/uL (ref 0.7–4.0)
MCHC: 33.8 g/dL (ref 30.0–36.0)
MCV: 91.4 fl (ref 78.0–100.0)
Monocytes Absolute: 0.6 10*3/uL (ref 0.1–1.0)
Monocytes Relative: 10.7 % (ref 3.0–12.0)
Neutro Abs: 3.6 10*3/uL (ref 1.4–7.7)
Neutrophils Relative %: 66.8 % (ref 43.0–77.0)
Platelets: 201 10*3/uL (ref 150.0–400.0)
RBC: 4.63 Mil/uL (ref 3.87–5.11)
RDW: 13.6 % (ref 11.5–15.5)
WBC: 5.3 10*3/uL (ref 4.0–10.5)

## 2022-03-09 LAB — COMPREHENSIVE METABOLIC PANEL
ALT: 18 U/L (ref 0–35)
AST: 21 U/L (ref 0–37)
Albumin: 4.2 g/dL (ref 3.5–5.2)
Alkaline Phosphatase: 90 U/L (ref 39–117)
BUN: 12 mg/dL (ref 6–23)
CO2: 26 mEq/L (ref 19–32)
Calcium: 9 mg/dL (ref 8.4–10.5)
Chloride: 102 mEq/L (ref 96–112)
Creatinine, Ser: 0.7 mg/dL (ref 0.40–1.20)
GFR: 81.39 mL/min (ref >60.00)
Glucose, Bld: 140 mg/dL — ABNORMAL HIGH (ref 70–99)
Potassium: 3.4 mEq/L — ABNORMAL LOW (ref 3.5–5.1)
Sodium: 138 mEq/L (ref 135–145)
Total Bilirubin: 0.6 mg/dL (ref 0.2–1.2)
Total Protein: 7 g/dL (ref 6.0–8.3)

## 2022-03-09 LAB — IBC + FERRITIN
Ferritin: 100.4 ng/mL (ref 10.0–291.0)
Iron: 70 ug/dL (ref 42–145)
Saturation Ratios: 19.2 % — ABNORMAL LOW (ref 20.0–50.0)
TIBC: 365.4 ug/dL (ref 250.0–450.0)
Transferrin: 261 mg/dL (ref 212.0–360.0)

## 2022-03-09 NOTE — Progress Notes (Deleted)
Cardiology Office Note:   Date:  03/09/2022  NAME:  Jessica Livingston    MRN: [id number] DOB:  1941-01-21   PCP:  Deland Pretty, MD  Cardiologist:  None  Electrophysiologist:  None   Referring MD: Deland Pretty, MD   No chief complaint on file. ***  History of Present Illness:   Jessica Livingston is a 81 y.o. female with a hx of cirrhosis, pancreatic CA, HLD who is being seen today for the evaluation of coronary calcifications at the request of Deland Pretty, MD.  Problem List Coronary calcifications  Cirrhosis Pancreatic CA HLD  Past Medical History: Past Medical History:  Diagnosis Date   breast cancer 2022   Left   CAD (coronary artery disease)    Cataract 2018   had sx   Depression    Heart disease '   History of radiation therapy    Left breast 05/21/21-06/18/21- Dr. Gery Pray   HLD (hyperlipidemia)    Obesity, unspecified    Other abnormal glucose    Other B-complex deficiencies    Other seborrheic keratosis    Postmenopausal     Past Surgical History: Past Surgical History:  Procedure Laterality Date   BREAST LUMPECTOMY WITH RADIOACTIVE SEED LOCALIZATION Left 04/16/2021   Procedure: LEFT BREAST LUMPECTOMY WITH RADIOACTIVE SEED LOCALIZATION;  Surgeon: Jovita Kussmaul, MD;  Location: [place];  Service: General;  Laterality: Left;   CARDIAC CATHETERIZATION  5/03   mild to mod single vessel disease   CHOLECYSTECTOMY     COLONOSCOPY  2018   HPP/TA   LAPAROSCOPIC GASTRIC BANDING  01/13/2010   NECK SURGERY     POLYPECTOMY  2018   TONSILLECTOMY     TUBAL LIGATION Bilateral     Current Medications: No outpatient medications have been marked as taking for the 03/11/22 encounter (Appointment) with Geralynn Rile, MD.     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  Tobacco Use   Smoking status: Never   Smokeless tobacco: Never  Vaping Use   Vaping Use: Never used  Substance and Sexual Activity   Alcohol use: Yes    Comment: rarely    Drug use: No   Sexual activity: Never  Other Topics Concern   Not on file  Social History Narrative   Married      3 children; 1 son-suicide      Retired      Geologist, 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   Social Determinants of Radio broadcast assistant Strain: Low Risk  (09/18/2021)   Overall Financial Resource Strain (CARDIA)    Difficulty of Paying Living Expenses: Not hard at all  Food Insecurity: No Food Insecurity (09/18/2021)   Hunger Vital Sign    Worried About Running Out of Food in the Last Year: Never true    [place] in the Last Year: Never true  Transportation Needs: No Transportation Needs (09/18/2021)   PRAPARE - Hydrologist (Medical): No    Lack of Transportation (Non-Medical): No  Physical Activity: Inactive (09/18/2021)   Exercise Vital Sign    Days of Exercise per Week: 0 days    Minutes of Exercise per Session: 0 min  Stress: No Stress Concern Present (09/18/2021)   [place]    Feeling of Stress : Not at all  Social Connections: Moderately Isolated (09/18/2021)   Social Connection and Isolation Panel [NHANES]    Frequency of Communication with Friends and Family: More than three times a week    Frequency of Social Gatherings with Friends and Family: Three times a week    Attends Religious Services: 1 to 4 times per year    Active Member of Clubs or Organizations: No    Attends Archivist Meetings: Never    Marital Status: Widowed     Family History: The patient's ***family history includes Aneurysm (age of onset: 66) in  her father; Breast cancer (age of onset: 8) in her mother; Diabetes in her brother and paternal grandmother; Heart attack (age of onset: 63) in her father; Heart disease in her father; Hyperlipidemia in her mother; Hypertension in her mother; Kidney disease (age of onset: 23) in her mother; Lung cancer (age of onset: 29) in her mother; Thyroid disease (age of onset: 27) in her mother. There is no history of Colon polyps, Colon cancer, Esophageal cancer, Rectal cancer, or Stomach cancer.  ROS:   All other ROS reviewed and negative. Pertinent positives noted in the HPI.     EKGs/Labs/Other Studies Reviewed:   The following studies were personally reviewed by me today:  EKG:  EKG is *** ordered today.  The ekg ordered today demonstrates ***, and was personally reviewed by me.   Recent Labs: 03/09/2022: ALT 18; BUN 12; Creatinine, Ser 0.70; Hemoglobin 14.3; Platelets 201.0; Potassium 3.4; Sodium 138   Recent Lipid Panel    Component Value Date/Time   CHOL 216 (H) 12/24/2010 0828   TRIG 104.0 12/24/2010 0828   HDL 53.70 12/24/2010 0828   CHOLHDL 4 12/24/2010 0828   VLDL 20.8 12/24/2010 0828   LDLCALC 111 (H) 10/23/2007 0916   LDLDIRECT 145.0 12/24/2010 0828    Physical Exam:   VS:  There were no vitals taken for this visit.   Wt Readings from Last 3 Encounters:  03/09/22 203 lb (92.1 kg)  01/18/22 209 lb 2 oz (94.9 kg)  07/20/21 208 lb 8 oz (94.6 kg)    General: Well nourished, well developed, in no acute distress Head: Atraumatic, normal size  Eyes: PEERLA, EOMI  Neck: Supple, no JVD Endocrine: No thryomegaly Cardiac: Normal S1, S2; RRR; no murmurs, rubs, or gallops Lungs: Clear to auscultation bilaterally, no wheezing, rhonchi or rales  Abd: Soft, nontender, no hepatomegaly  Ext: No edema, pulses 2+ Musculoskeletal: No deformities, BUE and BLE strength normal and equal Skin: Warm and dry, no rashes   Neuro: Alert and oriented to person, place, time, and situation, CNII-XII  grossly intact, no focal deficits  Psych: Normal mood and affect   ASSESSMENT:   Jessica Livingston is a 80 y.o. female who presents for the following: No diagnosis found.  PLAN:   There are no diagnoses linked to this encounter.  {Are you ordering a CV Procedure (e.g. stress test, cath, DCCV, TEE, etc)?   Press F2        :[id number]}  Disposition: No follow-ups on file.  Medication Adjustments/Labs and Tests Ordered: Current medicines are reviewed at length with the patient today.  Concerns regarding medicines are outlined above.  No orders of the defined types were placed in this encounter.  No orders of the defined types were placed in this encounter.   There are no Patient Instructions on file for this visit.   Time Spent with Patient: I have spent a total of *** minutes with patient reviewing hospital notes, telemetry, EKGs, labs and examining the patient as well as establishing an assessment and plan that was discussed with the patient.  > 50% of time was spent in direct patient care.  Signed, Addison Naegeli. Audie Box, MD, [place]  [address] [place], [place] [address] [phone]  03/09/2022 7:17 PM

## 2022-03-09 NOTE — Telephone Encounter (Signed)
Scheduled appt per 7/11 referral. Pt is aware of appt date and time. Pt is aware to arrive 15 mins prior to appt time and to bring and updated insurance card. Pt is aware of appt location.   

## 2022-03-09 NOTE — Progress Notes (Signed)
03/09/2022 Jessica Livingston [id number] 03/14/1941   HISTORY OF PRESENT ILLNESS: This is a pleasant 81 year old female who is a patient of Dr. Doyne Keel.  She was added on to my schedule urgently today for evaluation regarding a pancreatic mass that was seen on CT scan 03/04/2022.  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  CT also shows suspicion for cirrhosis and mild steatosis.  She tells me that she has been having some upper abdominal pain into her back for the past couple of months.  CBC and CMP from 6/26 at her PCPs office was normal/unremarkable.  She denies any history of alcohol abuse.  No family history of liver disease.  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   Past Medical History:  Diagnosis Date   breast cancer 2022   Left   CAD (coronary artery disease)    Cataract 2018   had sx   Depression    Heart disease '   History of radiation therapy    Left breast 05/21/21-06/18/21- Dr. Gery Pray   HLD (hyperlipidemia)    Obesity, unspecified    Other abnormal glucose    Other B-complex deficiencies    Other seborrheic keratosis    Postmenopausal    Past Surgical History:  Procedure Laterality Date   BREAST LUMPECTOMY WITH RADIOACTIVE SEED LOCALIZATION Left 04/16/2021   Procedure: LEFT BREAST LUMPECTOMY WITH RADIOACTIVE SEED LOCALIZATION;  Surgeon: Jovita Kussmaul, MD;  Location: [place];  Service: General;  Laterality: Left;   CARDIAC CATHETERIZATION  5/03   mild to mod single vessel disease   CHOLECYSTECTOMY     COLONOSCOPY  2018   HPP/TA   LAPAROSCOPIC GASTRIC BANDING  01/13/2010   NECK SURGERY     POLYPECTOMY  2018   TONSILLECTOMY     TUBAL LIGATION Bilateral     reports that she has never smoked. She has never used smokeless tobacco. She reports current alcohol use. She  reports that she does not use drugs. family history includes Aneurysm (age of onset: 37) in her father; Breast cancer (age of onset: 75) in her mother; Diabetes in her brother and paternal grandmother; Heart attack (age of onset: 24) in her father; Heart disease in her father; Hyperlipidemia in her mother; Hypertension in her mother; Kidney disease (age of onset: 22) in her mother; Lung cancer (age of onset: 58) in her mother; Thyroid disease (age of onset: 83) in her mother. Allergies  Allergen Reactions   Atorvastatin     leg pain   Rosuvastatin     leg pain      Outpatient Encounter Medications as of 03/09/2022  Medication Sig   Besifloxacin HCl (BESIVANCE) 0.6 % SUSP Place 1 drop into both eyes See admin instructions. Instill 1 drop into both eyes 3 times daily once a month on the day of monthly eye injections   Cyanocobalamin (VITAMIN B-12 PO) Take 1 capsule by mouth daily.   HYDROcodone-acetaminophen (NORCO) 10-325 MG tablet Take 1 tablet by mouth 4 (four) times daily as needed.   traMADol (ULTRAM) 50 MG tablet Take 50 mg by mouth every 6 (six) hours as needed.   VITAMIN D PO Take 1 capsule by mouth daily.   No facility-administered encounter medications on file as of 03/09/2022.     REVIEW OF SYSTEMS  : All other systems reviewed and negative  except where noted in the History of Present Illness.   PHYSICAL EXAM: BP 140/70   Pulse 64   Ht '5\' 3"'$  (1.6 m)   Wt 203 lb (92.1 kg)   SpO2 94%   BMI 35.96 kg/m  General: Well developed white female in no acute distress Head: Normocephalic and atraumatic Eyes:  Sclerae anicteric, conjunctiva pink. Ears: Normal auditory acuity Lungs: Clear throughout to auscultation; no W/R/R. Heart: Regular rate and rhythm; no M/R/G. Abdomen: Soft, non-distended.  BS present.  Non-tender.  Lap bad port noted on the right abdomen. Musculoskeletal: Symmetrical with no gross deformities  Skin: No lesions on visible extremities Extremities: No edema   Neurological: Alert oriented x 4, grossly non-focal Psychological:  Alert and cooperative. Normal mood and affect  ASSESSMENT AND PLAN: *Abnormal CT scan of the pancreas showing a mass suspicious for adenocarcinoma.  Labs on 626 did not indicate biliary obstruction and she does not appear jaundiced today.  We will repeat a CBC and CMP today along with a CA 19-9.  I have spoken with Dr. Ardis Hughs who is also supervising physician this morning who agreed to EUS on 7/20.  No need for ERCP at this point, but she was advised that if she starts noticing yellowing of her eyes or skin in the interim then she needs to make [place] aware. *Cirrhosis of the liver: New diagnosis, but suspect hepatic steatosis, likely early cirrhosis as no signs of decompensation/complications related to this.  Denies alcohol use.  No family history of liver disease.  We will perform the standard liver serologic evaluation.  Platelet count was previously normal as well as LFTs.  We will check PT/INR and AFP level.   CC:  Deland Pretty, MD

## 2022-03-09 NOTE — Progress Notes (Signed)
I agree with the above note, plan.  She is on schedule for EUS next week.

## 2022-03-09 NOTE — H&P (View-Only) (Signed)
03/09/2022 Jessica Livingston [id number] 07-Oct-1940   HISTORY OF PRESENT ILLNESS: This is a pleasant 81 year old female who is a patient of Dr. Doyne Keel.  She was added on to my schedule urgently today for evaluation regarding a pancreatic mass that was seen on CT scan 03/04/2022.  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  CT also shows suspicion for cirrhosis and mild steatosis.  She tells me that she has been having some upper abdominal pain into her back for the past couple of months.  CBC and CMP from 6/26 at her PCPs office was normal/unremarkable.  She denies any history of alcohol abuse.  No family history of liver disease.  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   Past Medical History:  Diagnosis Date   breast cancer 2022   Left   CAD (coronary artery disease)    Cataract 2018   had sx   Depression    Heart disease '   History of radiation therapy    Left breast 05/21/21-06/18/21- Dr. Gery Pray   HLD (hyperlipidemia)    Obesity, unspecified    Other abnormal glucose    Other B-complex deficiencies    Other seborrheic keratosis    Postmenopausal    Past Surgical History:  Procedure Laterality Date   BREAST LUMPECTOMY WITH RADIOACTIVE SEED LOCALIZATION Left 04/16/2021   Procedure: LEFT BREAST LUMPECTOMY WITH RADIOACTIVE SEED LOCALIZATION;  Surgeon: Jovita Kussmaul, MD;  Location: [place];  Service: General;  Laterality: Left;   CARDIAC CATHETERIZATION  5/03   mild to mod single vessel disease   CHOLECYSTECTOMY     COLONOSCOPY  2018   HPP/TA   LAPAROSCOPIC GASTRIC BANDING  01/13/2010   NECK SURGERY     POLYPECTOMY  2018   TONSILLECTOMY     TUBAL LIGATION Bilateral     reports that she has never smoked. She has never used smokeless tobacco. She reports current alcohol use. She  reports that she does not use drugs. family history includes Aneurysm (age of onset: 22) in her father; Breast cancer (age of onset: 67) in her mother; Diabetes in her brother and paternal grandmother; Heart attack (age of onset: 63) in her father; Heart disease in her father; Hyperlipidemia in her mother; Hypertension in her mother; Kidney disease (age of onset: 87) in her mother; Lung cancer (age of onset: 51) in her mother; Thyroid disease (age of onset: 84) in her mother. Allergies  Allergen Reactions   Atorvastatin     leg pain   Rosuvastatin     leg pain      Outpatient Encounter Medications as of 03/09/2022  Medication Sig   Besifloxacin HCl (BESIVANCE) 0.6 % SUSP Place 1 drop into both eyes See admin instructions. Instill 1 drop into both eyes 3 times daily once a month on the day of monthly eye injections   Cyanocobalamin (VITAMIN B-12 PO) Take 1 capsule by mouth daily.   HYDROcodone-acetaminophen (NORCO) 10-325 MG tablet Take 1 tablet by mouth 4 (four) times daily as needed.   traMADol (ULTRAM) 50 MG tablet Take 50 mg by mouth every 6 (six) hours as needed.   VITAMIN D PO Take 1 capsule by mouth daily.   No facility-administered encounter medications on file as of 03/09/2022.     REVIEW OF SYSTEMS  : All other systems reviewed and negative  except where noted in the History of Present Illness.   PHYSICAL EXAM: BP 140/70   Pulse 64   Ht '5\' 3"'$  (1.6 m)   Wt 203 lb (92.1 kg)   SpO2 94%   BMI 35.96 kg/m  General: Well developed white female in no acute distress Head: Normocephalic and atraumatic Eyes:  Sclerae anicteric, conjunctiva pink. Ears: Normal auditory acuity Lungs: Clear throughout to auscultation; no W/R/R. Heart: Regular rate and rhythm; no M/R/G. Abdomen: Soft, non-distended.  BS present.  Non-tender.  Lap bad port noted on the right abdomen. Musculoskeletal: Symmetrical with no gross deformities  Skin: No lesions on visible extremities Extremities: No edema   Neurological: Alert oriented x 4, grossly non-focal Psychological:  Alert and cooperative. Normal mood and affect  ASSESSMENT AND PLAN: *Abnormal CT scan of the pancreas showing a mass suspicious for adenocarcinoma.  Labs on 626 did not indicate biliary obstruction and she does not appear jaundiced today.  We will repeat a CBC and CMP today along with a CA 19-9.  I have spoken with Dr. Ardis Hughs who is also supervising physician this morning who agreed to EUS on 7/20.  No need for ERCP at this point, but she was advised that if she starts noticing yellowing of her eyes or skin in the interim then she needs to make [place] aware. *Cirrhosis of the liver: New diagnosis, but suspect hepatic steatosis, likely early cirrhosis as no signs of decompensation/complications related to this.  Denies alcohol use.  No family history of liver disease.  We will perform the standard liver serologic evaluation.  Platelet count was previously normal as well as LFTs.  We will check PT/INR and AFP level.   CC:  Deland Pretty, MD

## 2022-03-09 NOTE — Patient Instructions (Addendum)
If you are age 81 or older, your body mass index should be between 23-30. Your Body mass index is 35.96 kg/m. If this is out of the aforementioned range listed, please consider follow up with your Primary Care Provider. ________________________________________________________  The [facility] providers would like to encourage you to use [facility] to communicate with providers for non-urgent requests or questions.  Due to long hold times on the telephone, sending your provider a message by [facility] may be a faster and more efficient way to get a response.  Please allow 48 business hours for a response.  Please remember that this is for non-urgent requests.  _______________________________________________________  Dennis Bast have been scheduled for an EUS procedure at [facility] for 03/18/2022 with Dr. Ardis Hughs. Please arrive at 10:00 am. Expect to receive 3-4 phone calls from the hospital prior to your procedure.  Your provider has requested that you go to the basement level for lab work before leaving today. Press "B" on the elevator. The lab is located at the first door on the left as you exit the elevator.  Follow up pending at this time.  Thank you for entrusting me with your care and choosing [facility].  Alonza Bogus, PA-C

## 2022-03-10 ENCOUNTER — Inpatient Hospital Stay: Admission: RE | Admit: 2022-03-10 | Payer: PPO | Source: Ambulatory Visit

## 2022-03-10 LAB — ANA: Anti Nuclear Antibody (ANA): NEGATIVE

## 2022-03-10 LAB — CA 19-9 (SERIAL): CA 19-9: 1715 U/mL — ABNORMAL HIGH (ref 0–35)

## 2022-03-11 ENCOUNTER — Ambulatory Visit: Payer: PPO | Admitting: Cardiovascular Disease

## 2022-03-11 ENCOUNTER — Emergency Department (HOSPITAL_COMMUNITY)
Admission: EM | Admit: 2022-03-11 | Discharge: 2022-03-11 | Disposition: A | Discharge status: Home / Self Care | Payer: PPO | Attending: Emergency Medicine | Admitting: Emergency Medicine

## 2022-03-11 ENCOUNTER — Other Ambulatory Visit: Payer: Self-pay

## 2022-03-11 ENCOUNTER — Encounter (HOSPITAL_COMMUNITY): Payer: Self-pay | Admitting: Emergency Medicine

## 2022-03-11 ENCOUNTER — Encounter (HOSPITAL_COMMUNITY): Payer: Self-pay | Admitting: Gastroenterology

## 2022-03-11 ENCOUNTER — Emergency Department (HOSPITAL_COMMUNITY): Payer: PPO

## 2022-03-11 DIAGNOSIS — K8689 Other specified diseases of pancreas: Secondary | ICD-10-CM

## 2022-03-11 DIAGNOSIS — K869 Disease of pancreas, unspecified: Secondary | ICD-10-CM | POA: Diagnosis not present

## 2022-03-11 DIAGNOSIS — R935 Abnormal findings on diagnostic imaging of other abdominal regions, including retroperitoneum: Secondary | ICD-10-CM

## 2022-03-11 DIAGNOSIS — K760 Fatty (change of) liver, not elsewhere classified: Secondary | ICD-10-CM | POA: Diagnosis not present

## 2022-03-11 DIAGNOSIS — R112 Nausea with vomiting, unspecified: Secondary | ICD-10-CM

## 2022-03-11 DIAGNOSIS — I2510 Atherosclerotic heart disease of native coronary artery without angina pectoris: Secondary | ICD-10-CM

## 2022-03-11 DIAGNOSIS — K7460 Unspecified cirrhosis of liver: Secondary | ICD-10-CM

## 2022-03-11 DIAGNOSIS — R109 Unspecified abdominal pain: Secondary | ICD-10-CM | POA: Diagnosis present

## 2022-03-11 DIAGNOSIS — R9431 Abnormal electrocardiogram [ECG] [EKG]: Secondary | ICD-10-CM | POA: Diagnosis not present

## 2022-03-11 LAB — COMPREHENSIVE METABOLIC PANEL
ALT: 23 U/L (ref 0–44)
AST: 26 U/L (ref 15–41)
Albumin: 3.9 g/dL (ref 3.5–5.0)
Alkaline Phosphatase: 83 U/L (ref 38–126)
Anion gap: 8 (ref 5–15)
BUN: 11 mg/dL (ref 8–23)
CO2: 28 mmol/L (ref 22–32)
Calcium: 8.9 mg/dL (ref 8.9–10.3)
Chloride: 105 mmol/L (ref 98–111)
Creatinine, Ser: 0.66 mg/dL (ref 0.44–1.00)
GFR, Estimated: >60 mL/min (ref >60)
Glucose, Bld: 172 mg/dL — ABNORMAL HIGH (ref 70–99)
Potassium: 3.5 mmol/L (ref 3.5–5.1)
Sodium: 141 mmol/L (ref 135–145)
Total Bilirubin: 0.6 mg/dL (ref 0.3–1.2)
Total Protein: 7 g/dL (ref 6.5–8.1)

## 2022-03-11 LAB — URINALYSIS, ROUTINE W REFLEX MICROSCOPIC
Bilirubin Urine: NEGATIVE
Glucose, UA: NEGATIVE mg/dL
Ketones, ur: NEGATIVE mg/dL
Nitrite: NEGATIVE
Protein, ur: NEGATIVE mg/dL
Specific Gravity, Urine: 1.012 (ref 1.005–1.030)
pH: 5 (ref 5.0–8.0)

## 2022-03-11 LAB — CBC WITH DIFFERENTIAL/PLATELET
Abs Immature Granulocytes: 0.01 10*3/uL (ref 0.00–0.07)
Basophils Absolute: 0 10*3/uL (ref 0.0–0.1)
Basophils Relative: 0 %
Eosinophils Absolute: 0.1 10*3/uL (ref 0.0–0.5)
Eosinophils Relative: 1 %
HCT: 43.5 % (ref 36.0–46.0)
Hemoglobin: 14.4 g/dL (ref 12.0–15.0)
Immature Granulocytes: 0 %
Lymphocytes Relative: 22 %
Lymphs Abs: 1.1 10*3/uL (ref 0.7–4.0)
MCH: 30.8 pg (ref 26.0–34.0)
MCHC: 33.1 g/dL (ref 30.0–36.0)
MCV: 93.1 fL (ref 80.0–100.0)
Monocytes Absolute: 0.5 10*3/uL (ref 0.1–1.0)
Monocytes Relative: 10 %
Neutro Abs: 3.4 10*3/uL (ref 1.7–7.7)
Neutrophils Relative %: 67 %
Platelets: 189 10*3/uL (ref 150–400)
RBC: 4.67 MIL/uL (ref 3.87–5.11)
RDW: 12.7 % (ref 11.5–15.5)
WBC: 5.2 10*3/uL (ref 4.0–10.5)
nRBC: 0 % (ref 0.0–0.2)

## 2022-03-11 LAB — LIPASE, BLOOD: Lipase: 18 U/L (ref 11–51)

## 2022-03-11 LAB — TROPONIN I (HIGH SENSITIVITY)
Troponin I (High Sensitivity): 5 ng/L (ref <18)
Troponin I (High Sensitivity): 7 ng/L (ref <18)

## 2022-03-11 MED ORDER — ONDANSETRON HCL 4 MG/2ML IJ SOLN
4.0000 mg | Freq: Once | INTRAMUSCULAR | Status: AC
Start: 2022-03-11 — End: 2022-03-11
  Administered 2022-03-11: 4 mg via INTRAVENOUS
  Filled 2022-03-11: qty 2

## 2022-03-11 MED ORDER — IOHEXOL 300 MG/ML  SOLN
100.0000 mL | Freq: Once | INTRAMUSCULAR | Status: AC | PRN
  Administered 2022-03-11: 100 mL via INTRAVENOUS

## 2022-03-11 MED ORDER — CEPHALEXIN 500 MG PO CAPS: 500.0000 mg | ORAL_CAPSULE | Freq: Three times a day (TID) | ORAL | 0 refills | Status: DC

## 2022-03-11 MED ORDER — SODIUM CHLORIDE (PF) 0.9 % IJ SOLN
INTRAMUSCULAR | Status: AC
  Filled 2022-03-11: qty 50

## 2022-03-11 MED ORDER — SODIUM CHLORIDE 0.9 % IV BOLUS
1000.0000 mL | Freq: Once | INTRAVENOUS | Status: AC
  Administered 2022-03-11: 1000 mL via INTRAVENOUS

## 2022-03-11 MED ORDER — SODIUM CHLORIDE 0.9 % IV SOLN
1.0000 g | Freq: Once | INTRAVENOUS | Status: AC
  Administered 2022-03-11: 1 g via INTRAVENOUS
  Filled 2022-03-11: qty 10

## 2022-03-11 MED ORDER — ONDANSETRON 4 MG PO TBDP: 4.0000 mg | ORAL_TABLET | Freq: Three times a day (TID) | ORAL | 0 refills | Status: DC | PRN

## 2022-03-11 NOTE — ED Triage Notes (Signed)
Patient arrives ambulatory by POV c/o abdominal pain with N/V. Patient states she has been having pain ongoing for 2 months. Was seen at [facility] on the 11th and had imaging done. Was told she has hepatic cirrhosis with ascites and a pancreatic mass. Reports episode of emesis last night. Took a hydrocodone about an hour ago.

## 2022-03-11 NOTE — Discharge Instructions (Addendum)
Take the nausea medications as prescribed. Followup with Dr. Ardis Hughs for your procedure next week. Take the antibiotics for a possible urinary tract infection.  Return to the ED with worsening pain, fever, vomiting, unable to eat or drink or any other concerns

## 2022-03-11 NOTE — ED Provider Notes (Signed)
[facility] DEPT Provider Note   CSN: [id number] Arrival date & time: 03/11/22  2993     History  Chief Complaint  Patient presents with   Abdominal Pain    Jessica Livingston is a 81 y.o. female.  Patient recently diagnosed with pancreatic mass that is concerning for adenocarcinoma.  She is scheduled for EUS next week by gastroenterology.  She comes in today because of nausea and vomiting which are new.  Has been having upper abdominal pain and back pain for the past 2 months.  1 episode of vomiting last night that was nonbilious and nonbloody.  This concerned her and is a new symptom.  Denies any changes to her abdominal pain or back pain.  No chest pain or shortness of breath.  No fever.  Taking hydrocodone at home for pain.  No pain with urination or blood in the urine.  Has had multiple previous abdominal surgeries including lap band.  Vomiting is her only new symptom.  The history is provided by the patient.  Abdominal Pain Associated symptoms: fatigue, nausea and vomiting   Associated symptoms: no chest pain, no cough, no dysuria, no fever, no hematuria and no shortness of breath        Home Medications Prior to Admission medications   Medication Sig Start Date End Date Taking? Authorizing Provider  Besifloxacin HCl (BESIVANCE) 0.6 % SUSP Place 1 drop into both eyes See admin instructions. Instill 1 drop into both eyes 3 times daily once a month on the day of monthly eye injections    [provider]  Cyanocobalamin (VITAMIN B-12 PO) Take 2 tablets by mouth daily. gummy    [provider]  HYDROcodone-acetaminophen (NORCO) 10-325 MG tablet Take 1 tablet by mouth every 6 (six) hours as needed for severe pain or moderate pain. 03/09/22   [provider]  omeprazole (PRILOSEC) 20 MG capsule Take 20 mg by mouth daily. 02/22/22   [provider]  VITAMIN D PO Take 2 tablets by mouth daily. gummy    [provider]      Allergies    Atorvastatin and Rosuvastatin    Review of Systems   Review of Systems  Constitutional:  Positive for fatigue. Negative for activity change, appetite change and fever.  HENT:  Negative for congestion.   Respiratory:  Negative for cough, chest tightness and shortness of breath.   Cardiovascular:  Negative for chest pain.  Gastrointestinal:  Positive for abdominal pain, nausea and vomiting.  Genitourinary:  Negative for dysuria and hematuria.  Musculoskeletal:  Positive for back pain. Negative for arthralgias and myalgias.  Neurological:  Negative for tremors.   all other systems are negative except as noted in the HPI and PMH.    Physical Exam Updated Vital Signs BP (!) 179/73 (BP Location: Left Arm)   Pulse 70   Temp 98.6 F (37 C) (Oral)   Resp 16   Ht '5\' 3"'$  (1.6 m)   Wt 92.1 kg   SpO2 92%   BMI 35.96 kg/m  Physical Exam Vitals and nursing note reviewed.  Constitutional:      General: She is not in acute distress.    Appearance: She is well-developed.  HENT:     Head: Normocephalic and atraumatic.     Mouth/Throat:     Pharynx: No oropharyngeal exudate.  Eyes:     Conjunctiva/sclera: Conjunctivae normal.     Pupils: Pupils are equal, round, and reactive to light.  Comments: No scleral icterus  Neck:     Comments: No meningismus. Cardiovascular:     Rate and Rhythm: Normal rate and regular rhythm.     Heart sounds: Normal heart sounds. No murmur heard. Pulmonary:     Effort: Pulmonary effort is normal. No respiratory distress.     Breath sounds: Normal breath sounds.  Abdominal:     Palpations: Abdomen is soft.     Tenderness: There is abdominal tenderness. There is no guarding or rebound.     Comments: Distended, diffusely tender, no guarding or rebound  Musculoskeletal:        General: No tenderness. Normal range of motion.     Cervical back: Normal range of motion and neck supple.  Skin:    General: Skin is warm.  Neurological:      Mental Status: She is alert and oriented to person, place, and time.     Cranial Nerves: No cranial nerve deficit.     Motor: No abnormal muscle tone.     Coordination: Coordination normal.     Comments:  5/5 strength throughout. CN 2-12 intact.Equal grip strength.   Psychiatric:        Behavior: Behavior normal.     ED Results / Procedures / Treatments   Labs (all labs ordered are listed, but only abnormal results are displayed) Labs Reviewed  COMPREHENSIVE METABOLIC PANEL - Abnormal; Notable for the following components:      Result Value   Glucose, Bld 172 (*)    All other components within normal limits  URINALYSIS, ROUTINE W REFLEX MICROSCOPIC - Abnormal; Notable for the following components:   APPearance HAZY (*)    Hgb urine dipstick SMALL (*)    Leukocytes,Ua TRACE (*)    Bacteria, UA MANY (*)    All other components within normal limits  URINE CULTURE  CBC WITH DIFFERENTIAL/PLATELET  LIPASE, BLOOD  TROPONIN I (HIGH SENSITIVITY)  TROPONIN I (HIGH SENSITIVITY)    EKG EKG Interpretation  Date/Time:  Thursday March 11 2022 08:57:52 EDT Ventricular Rate:  59 PR Interval:  154 QRS Duration: 97 QT Interval:  439 QTC Calculation: 435 R Axis:   -18 Text Interpretation: Sinus rhythm Inferior infarct, old No significant change was found Confirmed by Ezequiel Essex [phone]) on 03/11/2022 9:03:37 AM  Radiology CT ABDOMEN PELVIS W CONTRAST  Result Date: 03/11/2022 CLINICAL DATA:  Abdominal pain; pancreatic mass EXAM: CT ABDOMEN AND PELVIS WITH CONTRAST TECHNIQUE: Multidetector CT imaging of the abdomen and pelvis was performed using the standard protocol following bolus administration of intravenous contrast. RADIATION DOSE REDUCTION: This exam was performed according to the departmental dose-optimization program which includes automated exposure control, adjustment of the mA and/or kV according to patient size and/or use of iterative reconstruction technique. CONTRAST:   143m OMNIPAQUE IOHEXOL 300 MG/ML  SOLN COMPARISON:  CT abdomen and pelvis dated March 04, 2022 FINDINGS: Lower chest: No acute abnormality.  Coronary artery calcifications. Hepatobiliary: Mild hepatic steatosis. No focal liver abnormality is seen. Status post cholecystectomy. No biliary dilatation. Pancreas: Stable hypoattenuating lesion of the pancreatic head with associated main pancreatic duct dilation, unchanged complete encasement and severe narrowing/occlusion of the splenoportal confluence and SMV. No evidence of arterial involvement. 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 Spleen: Normal in size without focal abnormality. Adrenals/Urinary Tract: Bilateral adrenal glands are unremarkable. Kidneys enhance symmetrically with no evidence of hydronephrosis or nephrolithiasis. Bladder is unremarkable. Stomach/Bowel: Prior partial sigmoid resection. Diverticulosis.  Normal appendix. Small hiatal hernia with gastric band in place. No bowel wall thickening, inflammatory change or evidence of obstruction. Vascular/Lymphatic: Aortic atherosclerosis. Vascular involvement related to pancreatic lesion as above. No enlarged abdominal or pelvic lymph nodes. Reproductive: Uterus and bilateral adnexa are unremarkable. Other: No abdominal wall hernia or abnormality. No abdominopelvic ascites. Musculoskeletal: Mild compression deformity of L3, unchanged when compared to prior. No aggressive appearing osseous lesions. IMPRESSION: 1. No acute findings in the abdomen or pelvis. 2. Unchanged pancreatic head lesion which is highly suspicious for adenocarcinoma. Stable pancreatic ductal dilation. No biliary ductal dilation. 3. Coronary artery calcifications and aortic Atherosclerosis (ICD10-I70.0). Electronically Signed   By: Yetta Glassman M.D.   On: 03/11/2022 10:32    Procedures Procedures    Medications Ordered in ED Medications  sodium chloride 0.9 %  bolus 1,000 mL (has no administration in time range)  ondansetron (ZOFRAN) injection 4 mg (has no administration in time range)    ED Course/ Medical Decision Making/ A&P                           Medical Decision Making Amount and/or Complexity of Data Reviewed Labs: ordered. Decision-making details documented in ED Course. Radiology: ordered and independent interpretation performed. Decision-making details documented in ED Course. ECG/medicine tests: ordered and independent interpretation performed. Decision-making details documented in ED Course.  Risk Prescription drug management.  Patient with known pancreatic mass here with nausea and vomiting abdominal pain.  No change in chronic pain but having vomiting which is new.  Last bowel movement was yesterday.  CT scan from July 6 reviewed and shows concerning pancreatic mass and cirrhosis.  Gastroenterology note from July 11 reviewed.  Patient given IV fluids as well as symptom control.  Her labs today are reassuring.  LFTs and lipase are normal.  No leukocytosis.  Labs appear to be stable.  Discussed with the [facility] gastroenterology PA-C [facility].  She agrees with repeat CT scan to rule out bowel obstruction.  Vital signs are stable and labs appear to be stable.  CT scan today shows stable pancreatic mass and pancreatic duct dilation.  No bowel obstruction.  No change.  Discussed with GI team.  They are considering performing EUS today. However patient ate this morning around 6 AM and Dr. Ardis Hughs has decided not to perform the EUS today.  Dr. Ardis Hughs has seen patient.  He is not going to be able to perform EUS today given patient's questionable UTI as well as her p.o. intake this morning.  We will attempt p.o. challenge and anticipate discharge home with symptom control  Patient tolerating p.o.  Pain is controlled.  Discussed follow-up with next week as scheduled with Dr. Ardis Hughs.  Will give prescription for antiemetics  and antibiotics. Return precautions discussed       Final Clinical Impression(s) / ED Diagnoses Final diagnoses:  Nausea and vomiting, unspecified vomiting type  Pancreatic mass    Rx / DC Orders ED Discharge Orders     None         Kyri Shader, Annie Main, MD 03/11/22 1220

## 2022-03-12 LAB — URINE CULTURE: Culture: NO GROWTH

## 2022-03-13 NOTE — Progress Notes (Signed)
Jess thanks for seeing this patient, sorry to hear about the imaging findings. Appreciate Dr. Ardis Hughs helping out with the EUS. Will await that result and that of her pending serologies for suspected cirrhosis evaluation,

## 2022-03-15 LAB — ALPHA-1-ANTITRYPSIN: A-1 Antitrypsin, Ser: 121 mg/dL (ref 83–199)

## 2022-03-15 LAB — HEPATITIS C ANTIBODY: Hepatitis C Ab: NONREACTIVE

## 2022-03-15 LAB — AFP TUMOR MARKER: AFP-Tumor Marker: 9.2 ng/mL — ABNORMAL HIGH

## 2022-03-15 LAB — HEPATITIS A ANTIBODY, TOTAL: Hepatitis A AB,Total: NONREACTIVE

## 2022-03-15 LAB — MITOCHONDRIAL ANTIBODIES: Mitochondrial M2 Ab, IgG: <=20 U (ref <=20.0)

## 2022-03-15 LAB — HEPATITIS B SURFACE ANTIGEN: Hepatitis B Surface Ag: NONREACTIVE

## 2022-03-15 LAB — HEPATITIS B SURFACE ANTIBODY,QUALITATIVE: Hep B S Ab: NONREACTIVE

## 2022-03-15 LAB — IGG: IgG (Immunoglobin G), Serum: 846 mg/dL (ref 600–1540)

## 2022-03-15 LAB — CERULOPLASMIN: Ceruloplasmin: 28 mg/dL (ref 18–53)

## 2022-03-15 LAB — ANTI-SMOOTH MUSCLE ANTIBODY, IGG: Actin (Smooth Muscle) Antibody (IGG): <20 U (ref <20)

## 2022-03-18 ENCOUNTER — Ambulatory Visit (HOSPITAL_COMMUNITY)
Admission: RE | Admit: 2022-03-18 | Discharge: 2022-03-18 | Disposition: A | Discharge status: Home / Self Care | Payer: PPO | Source: Ambulatory Visit | Attending: Gastroenterology | Admitting: Gastroenterology

## 2022-03-18 ENCOUNTER — Ambulatory Visit (HOSPITAL_COMMUNITY): Payer: PPO | Admitting: Anesthesiology

## 2022-03-18 ENCOUNTER — Ambulatory Visit (HOSPITAL_BASED_OUTPATIENT_CLINIC_OR_DEPARTMENT_OTHER): Payer: PPO | Admitting: Anesthesiology

## 2022-03-18 ENCOUNTER — Encounter (HOSPITAL_COMMUNITY): Payer: Self-pay | Admitting: Gastroenterology

## 2022-03-18 ENCOUNTER — Encounter (HOSPITAL_COMMUNITY)
Admission: RE | Disposition: A | Discharge status: Home / Self Care | Payer: Self-pay | Source: Ambulatory Visit | Attending: Gastroenterology

## 2022-03-18 ENCOUNTER — Other Ambulatory Visit: Payer: Self-pay

## 2022-03-18 DIAGNOSIS — K7460 Unspecified cirrhosis of liver: Secondary | ICD-10-CM

## 2022-03-18 DIAGNOSIS — R933 Abnormal findings on diagnostic imaging of other parts of digestive tract: Secondary | ICD-10-CM | POA: Diagnosis not present

## 2022-03-18 DIAGNOSIS — I2510 Atherosclerotic heart disease of native coronary artery without angina pectoris: Secondary | ICD-10-CM

## 2022-03-18 DIAGNOSIS — C253 Malignant neoplasm of pancreatic duct: Secondary | ICD-10-CM | POA: Diagnosis not present

## 2022-03-18 DIAGNOSIS — K8689 Other specified diseases of pancreas: Secondary | ICD-10-CM | POA: Diagnosis not present

## 2022-03-18 DIAGNOSIS — C250 Malignant neoplasm of head of pancreas: Secondary | ICD-10-CM | POA: Diagnosis not present

## 2022-03-18 DIAGNOSIS — R935 Abnormal findings on diagnostic imaging of other abdominal regions, including retroperitoneum: Secondary | ICD-10-CM

## 2022-03-18 HISTORY — PX: ESOPHAGOGASTRODUODENOSCOPY (EGD) WITH PROPOFOL: SHX5813

## 2022-03-18 HISTORY — PX: FINE NEEDLE ASPIRATION: SHX5430

## 2022-03-18 HISTORY — PX: EUS: SHX5427

## 2022-03-18 SURGERY — ESOPHAGOGASTRODUODENOSCOPY (EGD) WITH PROPOFOL
Anesthesia: Monitor Anesthesia Care

## 2022-03-18 MED ORDER — SODIUM CHLORIDE 0.9 % IV SOLN: INTRAVENOUS | Status: DC

## 2022-03-18 MED ORDER — SODIUM CHLORIDE 0.9 % IV SOLN
INTRAVENOUS | Status: DC
Start: 2022-03-18 — End: 2022-03-18

## 2022-03-18 MED ORDER — PROPOFOL 500 MG/50ML IV EMUL
INTRAVENOUS | Status: DC | PRN
  Administered 2022-03-18: 10 mg via INTRAVENOUS
  Administered 2022-03-18: 20 mg via INTRAVENOUS
  Administered 2022-03-18: 140 ug/kg/min via INTRAVENOUS
  Administered 2022-03-18: 20 mg via INTRAVENOUS
  Administered 2022-03-18: 10 mg via INTRAVENOUS

## 2022-03-18 MED ORDER — LACTATED RINGERS IV SOLN
INTRAVENOUS | Status: AC | PRN
  Administered 2022-03-18: 1000 mL via INTRAVENOUS

## 2022-03-18 NOTE — Interval H&P Note (Signed)
History and Physical Interval Note:  03/18/2022 9:06 AM  Jessica Livingston  has presented today for surgery, with the diagnosis of Cirrhosis, Pancreatic Mass.  The various methods of treatment have been discussed with the patient and family. After consideration of risks, benefits and other options for treatment, the patient has consented to  Procedure(s): ESOPHAGEAL ENDOSCOPIC ULTRASOUND (EUS) RADIAL (N/A) as a surgical intervention.  The patient's history has been reviewed, patient examined, no change in status, stable for surgery.  I have reviewed the patient's chart and labs.  Questions were answered to the patient's satisfaction.     [name]

## 2022-03-18 NOTE — Transfer of Care (Signed)
Immediate Anesthesia Transfer of Care Note  Patient: Jessica Livingston  Procedure(s) Performed: ESOPHAGEAL ENDOSCOPIC ULTRASOUND (EUS) RADIAL ESOPHAGOGASTRODUODENOSCOPY (EGD) WITH PROPOFOL FINE NEEDLE ASPIRATION (FNA) LINEAR  Patient Location: PACU and Endoscopy Unit  Anesthesia Type:MAC  Level of Consciousness: awake, alert , oriented and patient cooperative  Airway & Oxygen Therapy: Patient Spontanous Breathing and Patient connected to face mask oxygen  Post-op Assessment: Report given to RN, Post -op Vital signs reviewed and stable and Patient moving all extremities  Post vital signs: Reviewed and stable  Last Vitals:  Vitals Value Taken Time  BP 140/67 03/18/22 1105  Temp    Pulse 64 03/18/22 1107  Resp 17 03/18/22 1107  SpO2 100 % 03/18/22 1107  Vitals shown include unvalidated device data.  Last Pain:  Vitals:   03/18/22 0929  TempSrc: Temporal  PainSc: 0-No pain         Complications: No notable events documented.

## 2022-03-18 NOTE — Discharge Instructions (Signed)
YOU HAD AN ENDOSCOPIC PROCEDURE TODAY: Refer to the procedure report and other information in the discharge instructions given to you for any specific questions about what was found during the examination. If this information does not answer your questions, please call [facility] office at [phone] to clarify.  ° °YOU SHOULD EXPECT: Some feelings of bloating in the abdomen. Passage of more gas than usual. Walking can help get rid of the air that was put into your GI tract during the procedure and reduce the bloating.  ° °DIET: Your first meal following the procedure should be a light meal and then it is ok to progress to your normal diet. A half-sandwich or bowl of soup is an example of a good first meal. Heavy or fried foods are harder to digest and may make you feel nauseous or bloated. Drink plenty of fluids but you should avoid alcoholic beverages for 24 hours. ° °ACTIVITY: Your care partner should take you home directly after the procedure. You should plan to take it easy, moving slowly for the rest of the day. You can resume normal activity the day after the procedure however YOU SHOULD NOT DRIVE, use power tools, machinery or perform tasks that involve climbing or major physical exertion for 24 hours (because of the sedation medicines used during the test).  ° °SYMPTOMS TO REPORT IMMEDIATELY: °A gastroenterologist can be reached at any hour. Please call [phone]  for any of the following symptoms:  ° °Following upper endoscopy (EGD, EUS, ERCP, esophageal dilation) °Vomiting of blood or coffee ground material  °New, significant abdominal pain  °New, significant chest pain or pain under the shoulder blades  °Painful or persistently difficult swallowing  °New shortness of breath  °Black, tarry-looking or red, bloody stools ° °FOLLOW UP:  °If any biopsies were taken you will be contacted by phone or by letter within the next 1-3 weeks. Call [phone]  if you have not heard about the biopsies in 3 weeks.    °Please also call with any specific questions about appointments or follow up tests. ° °

## 2022-03-18 NOTE — Anesthesia Preprocedure Evaluation (Addendum)
Anesthesia Evaluation  Patient identified by MRN, date of birth, ID band Patient awake    Reviewed: Allergy & Precautions, NPO status , Patient's Chart, lab work & pertinent test results  Airway Mallampati: III  TM Distance: >3 FB Neck ROM: Full    Dental  (+) Teeth Intact, Dental Advisory Given   Pulmonary neg pulmonary ROS,    breath sounds clear to auscultation       Cardiovascular + CAD   Rhythm:Regular Rate:Normal     Neuro/Psych PSYCHIATRIC DISORDERS Depression negative neurological ROS     GI/Hepatic negative GI ROS, Neg liver ROS,   Endo/Other  negative endocrine ROS  Renal/GU negative Renal ROS     Musculoskeletal negative musculoskeletal ROS (+)   Abdominal Normal abdominal exam  (+)   Peds  Hematology negative hematology ROS (+)   Anesthesia Other Findings   Reproductive/Obstetrics                           Anesthesia Physical Anesthesia Plan  ASA: 3  Anesthesia Plan: MAC   Post-op Pain Management:    Induction: Intravenous  PONV Risk Score and Plan: 0 and Propofol infusion  Airway Management Planned: Natural Airway and Simple Face Mask  Additional Equipment: None  Intra-op Plan:   Post-operative Plan:   Informed Consent: I have reviewed the patients History and Physical, chart, labs and discussed the procedure including the risks, benefits and alternatives for the proposed anesthesia with the patient or authorized representative who has indicated his/her understanding and acceptance.       Plan Discussed with: CRNA  Anesthesia Plan Comments:        Anesthesia Quick Evaluation

## 2022-03-18 NOTE — Anesthesia Postprocedure Evaluation (Signed)
Anesthesia Post Note  Patient: Jessica Livingston  Procedure(s) Performed: ESOPHAGEAL ENDOSCOPIC ULTRASOUND (EUS) RADIAL ESOPHAGOGASTRODUODENOSCOPY (EGD) WITH PROPOFOL FINE NEEDLE ASPIRATION (FNA) LINEAR     Patient location during evaluation: PACU Anesthesia Type: MAC Level of consciousness: awake and alert Pain management: pain level controlled Vital Signs Assessment: post-procedure vital signs reviewed and stable Respiratory status: spontaneous breathing, nonlabored ventilation, respiratory function stable and patient connected to nasal cannula oxygen Cardiovascular status: stable and blood pressure returned to baseline Postop Assessment: no apparent nausea or vomiting Anesthetic complications: no   No notable events documented.  Last Vitals:  Vitals:   03/18/22 1110 03/18/22 1120  BP: (!) 146/84 (!) 170/71  Pulse: (!) 51 (!) 59  Resp: 16 18  Temp: 36.5 C   SpO2: 100% 100%    Last Pain:  Vitals:   03/18/22 1120  TempSrc:   PainSc: 0-No pain                 Effie Berkshire

## 2022-03-18 NOTE — Op Note (Signed)
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 [place], CRNA Referring MD:              Medicines:                Monitored Anesthesia Care Complications:            No immediate complications. Estimated blood loss:                            None. Estimated Blood Loss:     Estimated blood loss: none. Procedure:                Pre-Anesthesia Assessment:                           - Prior to the procedure, a History and Physical                            was performed, and patient medications and                            allergies were reviewed. The patient's tolerance of                            previous anesthesia was also reviewed. The risks                            and benefits of the procedure and the sedation                            options and risks were discussed with the patient.                            All questions were answered, and informed consent                            was obtained. Prior Anticoagulants: The patient has                            taken no previous anticoagulant or antiplatelet  agents. ASA Grade Assessment: II - A patient with                            mild systemic disease. After reviewing the risks                            and benefits, the patient was deemed in                            satisfactory condition to undergo the procedure.                           After obtaining informed consent, the endoscope was                             passed under direct vision. Throughout the                            procedure, the patient's blood pressure, pulse, and                            oxygen saturations were monitored continuously. The                            GF-[id number]-[id number] ([id number]) Olympus radial ultrasound                            scope was introduced through the mouth, and                            advanced to the second part of duodenum. The                            [id number] ([id number]) Olympus linear ultrasound scope                            was introduced through the mouth, and advanced to                            the second part of duodenum. The upper EUS was                            accomplished without difficulty. The patient                            tolerated the procedure well. Scope In: Scope Out: Findings:      ENDOSCOPIC FINDING: :      The examined esophagus was endoscopically normal.      The entire examined stomach was endoscopically normal.      The examined duodenum was endoscopically normal.      ENDOSONOGRAPHIC FINDING: :      1. Irregularly shaped, indistinctly bordered, heterogeneous, hypoechoic       mass that measures 3.4 cm maximally in the uncinate/head of pancreas.  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 The mass is causing mild biliary duct       obstruction with the common bile duct measuring 8.6 mm. The mass is also       obstructing and dilating the main pancreatic duct which measures up to 9       millimeters in the body and tail. I used a 25-gauge EUS FNB needle to       sample the lesion with a transduodenal approach, 2 passes.      2. No peripancreatic adenopathy      3. Limited views of the liver, spleen were normal. 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 The mass is                            causing mild biliary tract dilation and main                            pancreatic duct obstruction with dilation. Her                            liver tests have been normal. 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                           - I will forward these results and final path to                            her medical oncologist Dr. Lindi Adie. Moderate Sedation:      Not Applicable - Patient had care per Anesthesia. Recommendation:           - Discharge patient to home. Procedure Code(s):        --- Professional ---                           [phone], Esophagogastroduodenoscopy, flexible,                            transoral; with transendoscopic ultrasound-guided                            intramural or transmural fine needle  aspiration/biopsy(s), (includes endoscopic                            ultrasound examination limited to the esophagus,                            stomach or duodenum, and adjacent structures) Diagnosis Code(s):        --- Professional ---                           K86.89, Other specified diseases of pancreas                           R93.3, Abnormal findings on diagnostic imaging of                            other parts of digestive tract CPT copyright 2019 [employer]. All rights reserved. The codes documented in this report are preliminary and upon coder review may  be revised to meet current compliance requirements. [name], MD 03/18/2022 11:06:55 AM This report has been signed electronically. Number of Addenda: 0

## 2022-03-19 LAB — CYTOLOGY - NON PAP

## 2022-03-22 ENCOUNTER — Other Ambulatory Visit: Payer: Self-pay | Admitting: Genetic Counselor

## 2022-03-22 ENCOUNTER — Encounter (HOSPITAL_COMMUNITY): Payer: Self-pay | Admitting: Gastroenterology

## 2022-03-22 ENCOUNTER — Telehealth: Payer: Self-pay | Admitting: Physician Assistant

## 2022-03-22 DIAGNOSIS — Z1379 Encounter for other screening for genetic and chromosomal anomalies: Secondary | ICD-10-CM

## 2022-03-22 NOTE — Progress Notes (Unsigned)
[place] Telephone:(336) [phone]   Fax:(336) [phone]  CONSULT NOTE  REFERRING PHYSICIAN: Holland Commons NP  REASON FOR CONSULTATION:  Suspicious pancreatic cancer  HPI Jessica Livingston is a 81 y.o. female past medical history significant for left breast cancer, Lap-Band, cholecystectomy, coronary artery disease, cataracts, depression, heart disease, hyperlipidemia, obesity, and ***is referred to the clinic for suspected pancreatic cancer.  The patient's PCP ordered a CT scan performed on 03/04/2022 to evaluate left-sided abdominal pain which has been occurring for 2 months.   This showed a mass in the pancreatic head with resultant upstream atrophy and duct dilation.  There was venous encasement and no arterial involvement.  There is also suspected cirrhosis.  This subsequently led to the patient seeing gastroenterology on 03/09/2022.  Patient did not have any evidence of elevated LFTs or biliary obstruction on labs.  Her baseline CA 19.9 was elevated at 1, 715.  I also performed a standard liver serologic evaluation due to the new cirrhosis which they suspect is related to hepatic steatosis.  She does not have any signs of decompensation/complications related to this..   Patient had an EUS performed by Dr. Ardis Hughs on 03/18/2022 which showed irregular shaped mass measuring 3.4 cm in the uncinate/head of the pancreas.  The mass surrounds and attenuates the portal vein near the splenic vein/SMV confluence.  The mass is causing mild biliary obstruction in the common bile duct.  And obstructing/dilating the main pancreatic duct.   Final pathology [id number]) is consistent with adenocarcinoma.  Overall, the patient is feeling ***today.  The patient denies any fever, chills, night sweats, or unexplained weight loss?  Abdominal pain?  Location, frequency, pain level, character.  Norco control pain?  Nausea, vomiting, diarrhea, constipation.  Light stools, jaundice, itching,  GERD.  Chest pain, shortness of breath, cough, or hemoptysis.     HPI  Past Medical History:  Diagnosis Date   breast cancer 2022   Left   CAD (coronary artery disease)    Cataract 2018   had sx   Depression    Heart disease '   History of radiation therapy    Left breast 05/21/21-06/18/21- Dr. Gery Pray   HLD (hyperlipidemia)    Obesity, unspecified    Other abnormal glucose    Other B-complex deficiencies    Other seborrheic keratosis    Postmenopausal     Past Surgical History:  Procedure Laterality Date   BREAST LUMPECTOMY WITH RADIOACTIVE SEED LOCALIZATION Left 04/16/2021   Procedure: LEFT BREAST LUMPECTOMY WITH RADIOACTIVE SEED LOCALIZATION;  Surgeon: Jovita Kussmaul, MD;  Location: [place];  Service: General;  Laterality: Left;   CARDIAC CATHETERIZATION  5/03   mild to mod single vessel disease   CHOLECYSTECTOMY     COLONOSCOPY  2018   HPP/TA   LAPAROSCOPIC GASTRIC BANDING  01/13/2010   NECK SURGERY     POLYPECTOMY  2018   TONSILLECTOMY     TUBAL LIGATION Bilateral     Family History  Problem Relation Age of Onset   Heart attack Father 66   Heart disease Father    Aneurysm Father 66   Breast cancer Mother 1   Thyroid disease Mother 70   Hypertension Mother    Hyperlipidemia Mother    Lung cancer Mother 76   Kidney disease Mother 83   Diabetes Paternal Grandmother    Diabetes Brother    Colon polyps Neg Hx    Colon cancer Neg Hx    Esophageal  cancer Neg Hx    Rectal cancer Neg Hx    Stomach cancer Neg Hx     Social History Social History   Tobacco Use   Smoking status: Never   Smokeless tobacco: Never  Vaping Use   Vaping Use: Never used  Substance Use Topics   Alcohol use: Yes    Comment: rarely    Drug use: No    Allergies  Allergen Reactions   Atorvastatin     leg pain   Rosuvastatin     leg pain    Current Outpatient Medications  Medication Sig Dispense Refill   Besifloxacin HCl (BESIVANCE) 0.6 % SUSP Place 1 drop into  both eyes See admin instructions. Instill 1 drop into both eyes 3 times daily once a month on the day of monthly eye injections     cephALEXin (KEFLEX) 500 MG capsule Take 1 capsule (500 mg total) by mouth 3 (three) times daily. 21 capsule 0   Cyanocobalamin (VITAMIN B-12 PO) Take 2 tablets by mouth daily. gummy     HYDROcodone-acetaminophen (NORCO) 10-325 MG tablet Take 1 tablet by mouth every 6 (six) hours as needed for severe pain or moderate pain.     omeprazole (PRILOSEC) 20 MG capsule Take 20 mg by mouth daily.     ondansetron (ZOFRAN-ODT) 4 MG disintegrating tablet Take 1 tablet (4 mg total) by mouth every 8 (eight) hours as needed for nausea or vomiting. 20 tablet 0   VITAMIN D PO Take 2 tablets by mouth daily. gummy     No current facility-administered medications for this visit.    REVIEW OF SYSTEMS:   Review of Systems  Constitutional: Negative for appetite change, chills, fatigue, fever and unexpected weight change.  HENT:   Negative for mouth sores, nosebleeds, sore throat and trouble swallowing.   Eyes: Negative for eye problems and icterus.  Respiratory: Negative for cough, hemoptysis, shortness of breath and wheezing.   Cardiovascular: Negative for chest pain and leg swelling.  Gastrointestinal: Negative for abdominal pain, constipation, diarrhea, nausea and vomiting.  Genitourinary: Negative for bladder incontinence, difficulty urinating, dysuria, frequency and hematuria.   Musculoskeletal: Negative for back pain, gait problem, neck pain and neck stiffness.  Skin: Negative for itching and rash.  Neurological: Negative for dizziness, extremity weakness, gait problem, headaches, light-headedness and seizures.  Hematological: Negative for adenopathy. Does not bruise/bleed easily.  Psychiatric/Behavioral: Negative for confusion, depression and sleep disturbance. The patient is not nervous/anxious.     PHYSICAL EXAMINATION:  There were no vitals taken for this visit.  ECOG  PERFORMANCE STATUS: {CHL ONC ECOG [id number]  Physical Exam  Constitutional: Oriented to person, place, and time and well-developed, well-nourished, and in no distress. No distress.  HENT:  Head: Normocephalic and atraumatic.  Mouth/Throat: Oropharynx is clear and moist. No oropharyngeal exudate.  Eyes: Conjunctivae are normal. Right eye exhibits no discharge. Left eye exhibits no discharge. No scleral icterus.  Neck: Normal range of motion. Neck supple.  Cardiovascular: Normal rate, regular rhythm, normal heart sounds and intact distal pulses.   Pulmonary/Chest: Effort normal and breath sounds normal. No respiratory distress. No wheezes. No rales.  Abdominal: Soft. Bowel sounds are normal. Exhibits no distension and no mass. There is no tenderness.  Musculoskeletal: Normal range of motion. Exhibits no edema.  Lymphadenopathy:    No cervical adenopathy.  Neurological: Alert and oriented to person, place, and time. Exhibits normal muscle tone. Gait normal. Coordination normal.  Skin: Skin is warm and dry. No rash noted. Not  diaphoretic. No erythema. No pallor.  Psychiatric: Mood, memory and judgment normal.  Vitals reviewed.  LABORATORY DATA: Lab Results  Component Value Date   WBC 5.2 03/11/2022   HGB 14.4 03/11/2022   HCT 43.5 03/11/2022   MCV 93.1 03/11/2022   PLT 189 03/11/2022      Chemistry      Component Value Date/Time   NA 141 03/11/2022 0857   K 3.5 03/11/2022 0857   CL 105 03/11/2022 0857   CO2 28 03/11/2022 0857   BUN 11 03/11/2022 0857   CREATININE 0.66 03/11/2022 0857   CREATININE 0.90 02/25/2021 1214      Component Value Date/Time   CALCIUM 8.9 03/11/2022 0857   ALKPHOS 83 03/11/2022 0857   AST 26 03/11/2022 0857   AST 19 02/25/2021 1214   ALT 23 03/11/2022 0857   ALT 14 02/25/2021 1214   BILITOT 0.6 03/11/2022 0857   BILITOT 0.7 02/25/2021 1214       RADIOGRAPHIC STUDIES: CT ABDOMEN PELVIS W CONTRAST  Result Date: 03/11/2022 CLINICAL DATA:   Abdominal pain; pancreatic mass EXAM: CT ABDOMEN AND PELVIS WITH CONTRAST TECHNIQUE: Multidetector CT imaging of the abdomen and pelvis was performed using the standard protocol following bolus administration of intravenous contrast. RADIATION DOSE REDUCTION: This exam was performed according to the departmental dose-optimization program which includes automated exposure control, adjustment of the mA and/or kV according to patient size and/or use of iterative reconstruction technique. CONTRAST:  172m OMNIPAQUE IOHEXOL 300 MG/ML  SOLN COMPARISON:  CT abdomen and pelvis dated March 04, 2022 FINDINGS: Lower chest: No acute abnormality.  Coronary artery calcifications. Hepatobiliary: Mild hepatic steatosis. No focal liver abnormality is seen. Status post cholecystectomy. No biliary dilatation. Pancreas: Stable hypoattenuating lesion of the pancreatic head with associated main pancreatic duct dilation, unchanged complete encasement and severe narrowing/occlusion of the splenoportal confluence and SMV. No evidence of arterial involvement. 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 Spleen: Normal in size without focal abnormality. Adrenals/Urinary Tract: Bilateral adrenal glands are unremarkable. Kidneys enhance symmetrically with no evidence of hydronephrosis or nephrolithiasis. Bladder is unremarkable. Stomach/Bowel: Prior partial sigmoid resection. Diverticulosis. Normal appendix. Small hiatal hernia with gastric band in place. No bowel wall thickening, inflammatory change or evidence of obstruction. Vascular/Lymphatic: Aortic atherosclerosis. Vascular involvement related to pancreatic lesion as above. No enlarged abdominal or pelvic lymph nodes. Reproductive: Uterus and bilateral adnexa are unremarkable. Other: No abdominal wall hernia or abnormality. No abdominopelvic ascites. Musculoskeletal: Mild compression deformity of L3, unchanged when compared to  prior. No aggressive appearing osseous lesions. IMPRESSION: 1. No acute findings in the abdomen or pelvis. 2. Unchanged pancreatic head lesion which is highly suspicious for adenocarcinoma. Stable pancreatic ductal dilation. No biliary ductal dilation. 3. Coronary artery calcifications and aortic Atherosclerosis (ICD10-I70.0). Electronically Signed   By: LYetta GlassmanM.D.   On: 03/11/2022 10:32   CT ABDOMEN W CONTRAST  Result Date: 03/05/2022 CLINICAL DATA:  Left-sided abdominal pain for 2 months. Prior lap band. Cholecystectomy. Left breast cancer with lumpectomy and radiation therapy. EXAM: CT ABDOMEN WITH CONTRAST TECHNIQUE: Multidetector CT imaging of the abdomen was performed using the standard protocol following bolus administration of intravenous contrast. RADIATION DOSE REDUCTION: This exam was performed according to the departmental dose-optimization program which includes automated exposure control, adjustment of the mA and/or kV according to patient size and/or use of iterative reconstruction technique. CONTRAST:  1016mISOVUE-300 IOPAMIDOL (ISOVUE-300) INJECTION 61% COMPARISON:  01/27/2022 abdominal radiograph. No  prior CT. FINDINGS: Lower chest: Mild right hemidiaphragm elevation. Clear lung bases. Mild cardiomegaly. Left main and LAD coronary artery calcification. Hepatobiliary: Possible hepatic steatosis. Caudate lobe enlargement with subtle irregular hepatic capsule. Medial segment left liver lobe atrophy. Probable focal steatosis within the anterior aspect of segment 4A including on 20/2. Cholecystectomy, without biliary ductal dilatation. Pancreas: The pancreas is atrophic. Central hypoattenuation is favored to represent main and possibly side branch duct dilatation. Example within the neck at 9 mm on 31/2. Followed to the level of the pancreatic head, where a border deforming centrally hypoattenuating lesion measures 2.5 x 2.4 cm on 33/2. A component or less likely an adjacent node extends  caudally at 1.6 cm on 38/2 and coronal image 67. Pancreatic uncinate process probable side branch duct ectasia is well on 36/2. Spleen: Old granulomatous disease within Adrenals/Urinary Tract: Normal adrenal glands. Mild renal cortical thinning bilaterally. No hydronephrosis. Stomach/Bowel: Status post lap band, appropriately positioned. Normal colon and terminal ileum. Normal small bowel. Vascular/Lymphatic: Aortic atherosclerosis. No arterial involvement by tumor. The splenoportal confluence and SMV are involved with tumor. Example 32/2 and 34/2 respectively. Narrowing of the splenoportal confluence on coronal image 77. Retroaortic left renal vein. Otherwise, no abdominal adenopathy. Other: No ascites.  No evidence of omental or peritoneal disease. Musculoskeletal: Left breast skin thickening is mild. Surgical clips likely related to lumpectomy. Schmorl's node versus mild compression deformity at L4. IMPRESSION: 1. Findings highly suspicious for adenocarcinoma in the pancreatic head with resultant upstream atrophy and duct dilatation. Venous encasement as detailed above. No arterial involvement. 2. Suspect cirrhosis and mild steatosis. 3. Coronary artery atherosclerosis. Aortic Atherosclerosis (ICD10-I70.0). These results will be called to the ordering clinician or representative by the Radiologist Assistant, and communication documented in the PACS or [employer]. Electronically Signed   By: Abigail Miyamoto M.D.   On: 03/05/2022 11:43    ASSESSMENT: This is a very pleasant 81 year old female diagnosed with:   Pancreatic adenocarcinoma (***), diagnosed in July 2023.  -The patient presented with abdominal discomfort, back pain, and nausea/vomiting which led to a CT scan of the abdomen (03/05/22) which showed a pancreatic head mass with venous encasement.   -EUS/FNA showed irregular shaped mass measuring 3.4 cm in the uncinate/head of the pancreas.  The mass surrounds and attenuates the portal vein near the  splenic vein/SMV confluence.  The mass is causing mild biliary obstruction in the common bile duct.  And obstructing/dilating the main pancreatic duct.   Staged as T2N***MX lesion by endosonographic appearance.  -Dr. Burr Medico does not feel that he is a surgical canidate for a major operation such as a Whipple due to his multiple medical cormorbidites and with ***.  Dr. Burr Medico discussed that even with a major surgery such as Whipple surgery, that the risk of recurrence is greater than 50%.  Dr. Burr Medico also discussed that there is likely already micro-metastatic disease given the elevated baseline tumor marker CA 19.9 of 1715. -The patient was seen with Dr. Burr Medico today.  Dr. Burr Medico had a lengthy discussion with the patient about her current condition and recommended treatment options. --Dr. Burr Medico discussed that this is an aggressive cancer in nature, prognosis, and risk of recurrence.  -Dr. Burr Medico recommends a CT scan of the chest to complete the staging work-up. -Dr. Burr Medico recommends that the patient consider radiation with or without chemotherapy.  Dr. Burr Medico discussed that neither of these options would be curative.  If the patient is interested in radiotherapy, then Dr. Burr Medico would refer the patient  to Dr. Lisbeth Renshaw.  Dr. Burr Medico discussed that radiation would be local treatment for symptom control of her malignancy but it would not prevent metastasis. Of course, palliative care/hospice is always an option.  Dr. Burr Medico discussed that we have palliative care services in the office if desired.  -If the patient is interested in chemotherapy prior to radiation, Dr. Burr Medico would not recommend a multiple drug regimen due to concerns with his quality of life.  If the patient were interested in chemotherapy, she would recommend single agent gemcitabine.  She would likely give this every other week for tolerability.  The goal of treatment would be palliative to help with symptoms associated with disease burden and to control disease as long as  possible.  Of course, Dr. Burr Medico discussed that chemotherapy is not guaranteed to work. -Dr. Burr Medico would likely recommend chemotherapy for 3 to 4 months, potentially as long as 6 months prior to proceeding with radiation.  She would arrange for restaging scan after 3 months of treatment.  Of course, if the patient decided at any point that he wished to stop treatment, we would stop treatment and proceed with radiation and or palliative/hospice care if the patient desired. -If the patient decides to proceed with chemotherapy, would arrange for chemo education class.  We would also arrange for antiemetics and follow-up appointments.  -Dr. Burr Medico briefly mentioned a Port-A-Cath if the patient desired.  We can always start with peripheral IV access and arrange for Port-A-Cath with the patient wishes. -The adverse side effects of treatment were discussed including but not limited to fatigue, nausea, vomiting, risk of infection, kidney, liver dysfunction, myelosuppression. -We will discuss the patient's case at the multidisciplinary conference next week. 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 -After reviewing the pros and cons, the patient and Dr. Lindi Adie decided against antiestrogen treatment given her age and multiple issues with uterine polyps.  Genetics -The patient was referred to genetic counseling and she is expected to have blood work performed today  Symptoms -Patient presented with abdominal and back pain. -Currently prescribed ***which**her symptoms.   PLAN: -CBC, CMP -CT chest -chemo ed -Anti-emetics -f/u  The  patient voices understanding of current disease status and treatment options and is in agreement with the current care plan.  All questions were answered. The patient knows to call the clinic with any problems, questions or concerns. We can certainly see the patient much sooner if necessary.  Thank you so much for allowing me to participate in the care of Jessica Livingston. I will continue to follow up the patient with you and assist in her care.  I spent {CHL ONC TIME VISIT - UDJSH:[id number]} counseling the patient face to face. The total time spent in the appointment was {CHL ONC TIME VISIT - FOYDX:[id number]}.  Disclaimer: This note was dictated with voice recognition software. Similar sounding words can inadvertently be transcribed and may not be corrected upon review.   Merik Mignano L Rylin Seavey March 22, 2022, 8:43 AM

## 2022-03-22 NOTE — Telephone Encounter (Signed)
Scheduled appt per 7/20 staff msg from Dr. Burr Medico. Called pt, no answer. Left msg with appt date/time. Requested for pt to call back to confirm appt.

## 2022-03-22 NOTE — Telephone Encounter (Signed)
Pt called back and confirmed her appt for tomorrow 7/25 at 8:30 am.

## 2022-03-23 ENCOUNTER — Other Ambulatory Visit: Payer: Self-pay

## 2022-03-23 ENCOUNTER — Encounter: Payer: Self-pay | Admitting: Physician Assistant

## 2022-03-23 ENCOUNTER — Inpatient Hospital Stay: Payer: PPO

## 2022-03-23 ENCOUNTER — Telehealth: Payer: Self-pay | Admitting: Genetic Counselor

## 2022-03-23 ENCOUNTER — Inpatient Hospital Stay: Payer: PPO | Attending: Physician Assistant | Admitting: Physician Assistant

## 2022-03-23 VITALS — BP 183/85 | HR 71 | Temp 97.8°F | Resp 17 | Ht 63.0 in | Wt 200.9 lb

## 2022-03-23 DIAGNOSIS — C250 Malignant neoplasm of head of pancreas: Secondary | ICD-10-CM | POA: Diagnosis not present

## 2022-03-23 DIAGNOSIS — Z9884 Bariatric surgery status: Secondary | ICD-10-CM | POA: Diagnosis not present

## 2022-03-23 DIAGNOSIS — Z801 Family history of malignant neoplasm of trachea, bronchus and lung: Secondary | ICD-10-CM | POA: Insufficient documentation

## 2022-03-23 DIAGNOSIS — Z79899 Other long term (current) drug therapy: Secondary | ICD-10-CM | POA: Insufficient documentation

## 2022-03-23 DIAGNOSIS — Z86000 Personal history of in-situ neoplasm of breast: Secondary | ICD-10-CM | POA: Insufficient documentation

## 2022-03-23 DIAGNOSIS — K7460 Unspecified cirrhosis of liver: Secondary | ICD-10-CM | POA: Diagnosis not present

## 2022-03-23 DIAGNOSIS — Z1379 Encounter for other screening for genetic and chromosomal anomalies: Secondary | ICD-10-CM

## 2022-03-23 DIAGNOSIS — E119 Type 2 diabetes mellitus without complications: Secondary | ICD-10-CM | POA: Diagnosis not present

## 2022-03-23 DIAGNOSIS — Z803 Family history of malignant neoplasm of breast: Secondary | ICD-10-CM | POA: Insufficient documentation

## 2022-03-23 DIAGNOSIS — Z853 Personal history of malignant neoplasm of breast: Secondary | ICD-10-CM | POA: Diagnosis not present

## 2022-03-23 DIAGNOSIS — E669 Obesity, unspecified: Secondary | ICD-10-CM | POA: Insufficient documentation

## 2022-03-23 DIAGNOSIS — Z8507 Personal history of malignant neoplasm of pancreas: Secondary | ICD-10-CM | POA: Diagnosis not present

## 2022-03-23 DIAGNOSIS — C259 Malignant neoplasm of pancreas, unspecified: Secondary | ICD-10-CM

## 2022-03-23 DIAGNOSIS — E785 Hyperlipidemia, unspecified: Secondary | ICD-10-CM | POA: Diagnosis not present

## 2022-03-23 DIAGNOSIS — G893 Neoplasm related pain (acute) (chronic): Secondary | ICD-10-CM

## 2022-03-23 LAB — GENETIC SCREENING ORDER

## 2022-03-23 MED ORDER — LIDOCAINE-PRILOCAINE 2.5-2.5 % EX CREA: TOPICAL_CREAM | CUTANEOUS | 3 refills | Status: DC

## 2022-03-23 MED ORDER — HYDROCODONE-ACETAMINOPHEN 10-325 MG PO TABS: 1.0000 | ORAL_TABLET | Freq: Four times a day (QID) | ORAL | 0 refills | Status: DC | PRN

## 2022-03-23 MED ORDER — ONDANSETRON HCL 8 MG PO TABS: 8.0000 mg | ORAL_TABLET | Freq: Two times a day (BID) | ORAL | 1 refills | Status: DC | PRN

## 2022-03-23 MED ORDER — PROCHLORPERAZINE MALEATE 10 MG PO TABS: 10.0000 mg | ORAL_TABLET | Freq: Four times a day (QID) | ORAL | 1 refills | Status: DC | PRN

## 2022-03-23 MED ORDER — PROCHLORPERAZINE MALEATE 10 MG PO TABS: 10.0000 mg | ORAL_TABLET | Freq: Four times a day (QID) | ORAL | 2 refills | Status: DC | PRN

## 2022-03-23 MED ORDER — MORPHINE SULFATE ER 15 MG PO TBCR: 15.0000 mg | EXTENDED_RELEASE_TABLET | Freq: Two times a day (BID) | ORAL | 0 refills | Status: DC

## 2022-03-23 NOTE — Research (Signed)
[employer] 2021-05 - Specimen Collection Study to Evaluate Biomarkers in Subjects with Cancer   Met with Ms Ganson and her brother. She is ineligible due to dx of breast cancer within last 5 years. Thanked her for her time and escorted her to the lab.  Marjie Skiff Clarrisa Kaylor, RN, BSN, [facility] She  Her  Hers Clinical Research Nurse [facility] Direct Dial [phone]  Pager [phone] 03/23/2022 10:15 AM

## 2022-03-23 NOTE — Progress Notes (Signed)
I met with Ms Schrag and her brother, larry after  her consultation with Cassie Heilingoetter, PA-C and Dr Burr Medico.  I explained my role as a nurse navigator and provided my contact information.  I explained the services provided at [facility] and provided written information. She does not drive.  I explained our transportation services.  She will let me know if she would like to use this service.  I explained the alight grant and let her know one of the financial advisors will reach out to  her at the time of her chemo education class.  I encouraged her to bring another person to the chemo education class. I briefly explained insertion and care of a port a cath.  I showed a sample of the port a cath.  I briefly reviewed common side effects associated with the recommended chemotherapy regimen, gemzar/abraxane.  I told her that she will be scheduled for chemotherapy education class prior to receiving chemotherapy.  Ms Campoverde will call me with her decision regarding treatment.  All questions were answered. She verbalized understanding.

## 2022-03-23 NOTE — Telephone Encounter (Signed)
[facility] CancerNext-Expanded Panel was ordered on 03/23/2022.  Lucille Passy, MS, [facility] Genetic Counselor [email or web address] (P) [phone]

## 2022-03-23 NOTE — Patient Instructions (Addendum)
Constipation education: It is common for patients who are undergoing treatment and taking certain prescribed medications (such as pain medication) to experience side-effects with constipation.  If you experience constipation, please take stool softener such as Colace or Senna one tablet twice a day everyday to avoid constipation.  These medications are available over the counter.  Of course, if you have diarrhea, stop taking stool softeners.  Drinking plenty of fluid, eating fruits and vegetable, and being active also reduces the risk of constipation.   If despite taking stool softeners, and you still have no bowel movement for 2 days or more than your normal bowel habit frequency, please take one of the following over the counter laxatives:  MiraLax, Milk of Magnesia or Mag Citrate everyday. The goal is to have at least one bowel movement every other day.   Summary:  -The sample (biopsy) that Dr. Ardis Hughs took of your tumor was consistent with pancreatic cancer called adenocarcinoma.  -Thus far, looks like the tumor is localized to the pancreas.  We do need a CT scan to make sure we are completing the "staging" work-up.  It does look like the tumor is close to important blood vessels in the abdomen.  It does look like you may be a borderline surgical candidate because of this. We would like for you to meet with  a surgeon to review in more depth if they feel you are a surgical candidate. The surgery that is done for this is called a Whipple procedure.  This is a large surgery that Dr. Zenia Resides or Dr. Barry Dienes would performed.  There are a lot of important structures in this area which is why the surgery is a more involved surgery. -Typically, patients will require chemotherapy prior to surgery to shrink the tumor and make surgical resection easier.  That is where Dr. Burr Medico comes in.  Dr. Burr Medico is the chemotherapy doctor.  If you are a surgical candidate, Dr. Burr Medico recommends a 2 drug regimen.  The chemo drugs are  called gemcitabine and Abraxane.  This can be given once every other week vs. once a week for 2 weeks in a row and then you get 1 week off.  Should be administered here at the cancer center.  This is a intravenous medication meaning it is given an IV.  If you went through with chemotherapy, it may be easier to have a Port-A-Cath placed.  The duration of chemotherapy would be approximately 3 to 6 months.  Dr. Burr Medico would repeat a scan after 3 months of treatment to ensure that the tumor is shrinking. -Dr. Burr Medico feels that this regimen would be more tolerable for you as the 3 drug regimen is more intensive can affect quality of life.  -The "pill" that Dr. Ardis Hughs mentioned would not be strong enough to shrink the tumor for surgery.  He was referring to a pill called Xeloda/capecitabine. In general, pancreatic cancer is a more aggressive cancer.  With chemotherapy and surgery, the rate of cure is approximately 30%. -Dr. Burr Medico is going to discuss your case at the multidisciplinary conference next week.  This way, Dr. Burr Medico can get the opinion of the radiologist, surgeons, radiation doctors, gastroenterologist, etc for everyone to come to an agreement what the best treatment option is for you. -Dr. Burr Medico does want to to proceed with genetic testing as you qualify.  Approximately 5% of pancreatic cancer may have a hereditary component.  If positive, you would likely see genetic counselor after this.  This is  more relevant for your children.  Although, if you do have a certain genetic marker, there is a pill treatment for that. -If you are not a surgical candidate, then treatment can include chemotherapy and/or radiation.  This would be localized therapy to prolong life and to help with disease control but would not be curative.  We will discuss this all again in more depth once we evaluate if you are a candidate for surgery or not.   If after discussion at the multi disciplinary conference, if  the plan is to proceed  with chemotherapy, we we will call you with the recommendation.  We would also arrange for  a Chemotherapy Education Class. This involves having you sit down with one of our nurse educators. She will discuss with you one-on-one more details about your treatment as well as general information about resources here at the cancer center.    Medications:  -I have sent a few important medication prescriptions to your pharmacy.  -Compazine was sent to your pharmacy. This medication is for nausea. You may take this every 6 hours as needed if you feel nauseous.  -You also already have a prescription for zofran to take every 8 hours as needed for nausea starting 3 days after chemotherapy. While you are not on chemo, you can take any time every 8 hours as needed -MS Contin 15 mg twice a day.  Please do not cut or break this tablet in half.  This will give you a little bit of pain medication throughout the day.  For times where you need something additional, then you can take the hydrocodone/Tylenol every 6 hours as needed for breakthrough pain.  I have also refilled your hydrocodone. -Pain medication can cause constipation.  Please refer to the above for a bowel regimen.  I would recommend taking stool softeners daily to try and prevent constipation.  If you are acutely constipated, I recommend taking a laxative.    Referrals or Imaging: -We will need to arrange for a CT scan of the chest to complete the staging work-up.  They should call you from the scanning department.  If you need the number for any reason their number is [phone] -In the future if you need help with transportation to appointments, please let us know we be happy to set you up with a social worker. -I have also placed a referral to the genetic counselor and nutritionist -I have placed a referral to Dr. Zenia Resides and Dr. Marlowe Aschoff office at [facility] surgery.  Dr. Burr Medico will communicate with them about your case and see if you are a  candidate for surgery.     Follow up:  -We will see you back for a follow up visit  after your case is discussed at the multidisciplinary conference and after Dr. Burr Medico has talked to surgery (Dr. Zenia Resides and Dr. Barry Dienes)   -If you need to reach [place] at any time, the main office number to the cancer center is [phone].

## 2022-03-23 NOTE — Progress Notes (Signed)
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

## 2022-03-24 ENCOUNTER — Inpatient Hospital Stay: Payer: PPO | Admitting: Hematology and Oncology

## 2022-03-24 ENCOUNTER — Telehealth: Payer: Self-pay | Admitting: Hematology and Oncology

## 2022-03-24 ENCOUNTER — Telehealth: Payer: Self-pay | Admitting: Physician Assistant

## 2022-03-24 ENCOUNTER — Other Ambulatory Visit: Payer: Self-pay

## 2022-03-24 NOTE — Progress Notes (Signed)
Referral, demographics, insurance information, and consult note faxed to [facility]. 

## 2022-03-24 NOTE — Telephone Encounter (Signed)
.  Called patient to schedule appointment per 7/25 inbasket, patient is aware of date and time.   

## 2022-03-24 NOTE — Telephone Encounter (Signed)
Cancelled appt with Dr. Lindi Adie due to Dr. Burr Medico taking over care. Spoke to pt who is aware appt with Dr. Lindi Adie was cancelled.

## 2022-03-25 ENCOUNTER — Other Ambulatory Visit: Payer: Self-pay | Admitting: Hematology

## 2022-03-25 NOTE — Progress Notes (Signed)
Ms Dugo called stating that the MS contin made her dizzy and very sleepy and she does not want to take it at this time.  She has norco that controls the pain but causes some nausea.  I advised her to take zofran '8mg'$  prior to taking the norco to see if that helps with the nausea.  I told her I would discuss this with Dr Burr Medico to see if she may change to oxycodone. She states last pm she had chicken and waffles for dinner and throught the night she had dry heaves, belching, and diarrhea.  I told her that her pancreas excretes digestive enzymes and that she may have had difficulty digesting the meal she ate.  I advised her to eat a low  fat low fiber diet. I told her I would discuss with Dr Burr Medico. She has an appt with our dietician on 8/1. She states she has an appointment with Dr Zenia Resides on 8/14.  She states she would like to move forward with chemotherapy treatment.  I told her I will let Dr Burr Medico know and we will facilitate that. .All questions were answered.  She verbalized understanding.

## 2022-03-26 ENCOUNTER — Other Ambulatory Visit: Payer: Self-pay

## 2022-03-26 DIAGNOSIS — C259 Malignant neoplasm of pancreas, unspecified: Secondary | ICD-10-CM

## 2022-03-29 ENCOUNTER — Telehealth: Payer: Self-pay | Admitting: Hematology

## 2022-03-29 NOTE — Telephone Encounter (Signed)
Scheduled per 7/28 in basket, pt confirmed appts

## 2022-03-30 ENCOUNTER — Encounter: Payer: Self-pay | Admitting: Nutrition

## 2022-03-30 ENCOUNTER — Ambulatory Visit (HOSPITAL_COMMUNITY)
Admission: RE | Admit: 2022-03-30 | Discharge: 2022-03-30 | Disposition: A | Discharge status: Home / Self Care | Payer: PPO | Source: Ambulatory Visit | Attending: Physician Assistant | Admitting: Physician Assistant

## 2022-03-30 ENCOUNTER — Other Ambulatory Visit: Payer: Self-pay | Admitting: Radiology

## 2022-03-30 ENCOUNTER — Inpatient Hospital Stay: Payer: PPO | Attending: Physician Assistant | Admitting: Nutrition

## 2022-03-30 ENCOUNTER — Encounter (HOSPITAL_COMMUNITY): Payer: Self-pay

## 2022-03-30 DIAGNOSIS — C801 Malignant (primary) neoplasm, unspecified: Secondary | ICD-10-CM | POA: Insufficient documentation

## 2022-03-30 DIAGNOSIS — C259 Malignant neoplasm of pancreas, unspecified: Secondary | ICD-10-CM | POA: Diagnosis not present

## 2022-03-30 DIAGNOSIS — Z9884 Bariatric surgery status: Secondary | ICD-10-CM | POA: Insufficient documentation

## 2022-03-30 DIAGNOSIS — Z5111 Encounter for antineoplastic chemotherapy: Secondary | ICD-10-CM | POA: Insufficient documentation

## 2022-03-30 DIAGNOSIS — C250 Malignant neoplasm of head of pancreas: Secondary | ICD-10-CM | POA: Insufficient documentation

## 2022-03-30 DIAGNOSIS — Z79899 Other long term (current) drug therapy: Secondary | ICD-10-CM | POA: Insufficient documentation

## 2022-03-30 DIAGNOSIS — Z79891 Long term (current) use of opiate analgesic: Secondary | ICD-10-CM | POA: Insufficient documentation

## 2022-03-30 DIAGNOSIS — Z86000 Personal history of in-situ neoplasm of breast: Secondary | ICD-10-CM | POA: Insufficient documentation

## 2022-03-30 DIAGNOSIS — R918 Other nonspecific abnormal finding of lung field: Secondary | ICD-10-CM | POA: Diagnosis not present

## 2022-03-30 DIAGNOSIS — R911 Solitary pulmonary nodule: Secondary | ICD-10-CM | POA: Diagnosis not present

## 2022-03-30 DIAGNOSIS — Z923 Personal history of irradiation: Secondary | ICD-10-CM | POA: Insufficient documentation

## 2022-03-30 DIAGNOSIS — Z803 Family history of malignant neoplasm of breast: Secondary | ICD-10-CM | POA: Insufficient documentation

## 2022-03-30 MED ORDER — IOHEXOL 300 MG/ML  SOLN
75.0000 mL | Freq: Once | INTRAMUSCULAR | Status: AC | PRN
  Administered 2022-03-30: 75 mL via INTRAVENOUS

## 2022-03-30 MED ORDER — SODIUM CHLORIDE (PF) 0.9 % IJ SOLN
INTRAMUSCULAR | Status: AC
  Filled 2022-03-30: qty 50

## 2022-03-30 NOTE — Progress Notes (Signed)
Patient did not show up for nutrition appointment. 

## 2022-03-31 ENCOUNTER — Other Ambulatory Visit: Payer: Self-pay

## 2022-03-31 ENCOUNTER — Ambulatory Visit (HOSPITAL_COMMUNITY)
Admission: RE | Admit: 2022-03-31 | Discharge: 2022-03-31 | Disposition: A | Discharge status: Home / Self Care | Payer: PPO | Source: Ambulatory Visit | Attending: Hematology | Admitting: Hematology

## 2022-03-31 ENCOUNTER — Encounter (HOSPITAL_COMMUNITY): Payer: Self-pay

## 2022-03-31 ENCOUNTER — Inpatient Hospital Stay: Payer: PPO

## 2022-03-31 DIAGNOSIS — E785 Hyperlipidemia, unspecified: Secondary | ICD-10-CM | POA: Diagnosis not present

## 2022-03-31 DIAGNOSIS — Z452 Encounter for adjustment and management of vascular access device: Secondary | ICD-10-CM | POA: Diagnosis not present

## 2022-03-31 DIAGNOSIS — Z853 Personal history of malignant neoplasm of breast: Secondary | ICD-10-CM | POA: Diagnosis not present

## 2022-03-31 DIAGNOSIS — F329 Major depressive disorder, single episode, unspecified: Secondary | ICD-10-CM | POA: Insufficient documentation

## 2022-03-31 DIAGNOSIS — C259 Malignant neoplasm of pancreas, unspecified: Secondary | ICD-10-CM | POA: Diagnosis not present

## 2022-03-31 DIAGNOSIS — I2510 Atherosclerotic heart disease of native coronary artery without angina pectoris: Secondary | ICD-10-CM | POA: Insufficient documentation

## 2022-03-31 HISTORY — PX: IR IMAGING GUIDED PORT INSERTION: IMG5740

## 2022-03-31 MED ORDER — MIDAZOLAM HCL 2 MG/2ML IJ SOLN
INTRAMUSCULAR | Status: AC
  Filled 2022-03-31: qty 2

## 2022-03-31 MED ORDER — HEPARIN SOD (PORK) LOCK FLUSH 100 UNIT/ML IV SOLN
INTRAVENOUS | Status: AC
  Filled 2022-03-31: qty 5

## 2022-03-31 MED ORDER — MIDAZOLAM HCL 2 MG/2ML IJ SOLN
INTRAMUSCULAR | Status: AC | PRN
  Administered 2022-03-31: 1 mg via INTRAVENOUS

## 2022-03-31 MED ORDER — SODIUM CHLORIDE 0.9 % IV SOLN: INTRAVENOUS | Status: DC

## 2022-03-31 MED ORDER — FENTANYL CITRATE (PF) 100 MCG/2ML IJ SOLN
INTRAMUSCULAR | Status: AC
  Filled 2022-03-31: qty 2

## 2022-03-31 MED ORDER — LIDOCAINE HCL 1 % IJ SOLN
INTRAMUSCULAR | Status: AC | PRN
  Administered 2022-03-31: 20 mL

## 2022-03-31 MED ORDER — HEPARIN SOD (PORK) LOCK FLUSH 100 UNIT/ML IV SOLN
INTRAVENOUS | Status: AC | PRN
  Administered 2022-03-31: 500 [IU] via INTRAVENOUS

## 2022-03-31 MED ORDER — LIDOCAINE HCL 1 % IJ SOLN
INTRAMUSCULAR | Status: AC
  Filled 2022-03-31: qty 20

## 2022-03-31 MED ORDER — FENTANYL CITRATE (PF) 100 MCG/2ML IJ SOLN
INTRAMUSCULAR | Status: AC | PRN
  Administered 2022-03-31: 50 ug via INTRAVENOUS

## 2022-03-31 NOTE — Discharge Instructions (Signed)

## 2022-03-31 NOTE — Procedures (Signed)
Interventional Radiology Procedure Note  Procedure: Placement of a right IJ approach single lumen PowerPort.  Tip is positioned at the superior cavoatrial junction and catheter is ready for immediate use.  Complications: None Recommendations:  - Ok to shower tomorrow - Do not submerge for 7 days - Routine line care   Signed,  Ryelle Ruvalcaba S. Lurlene Ronda, DO   

## 2022-03-31 NOTE — Progress Notes (Signed)
The proposed treatment discussed in conference is for discussion purpose only and is not a binding recommendation.  The patients have not been physically examined, or presented with their treatment options.  Therefore, final treatment plans cannot be decided.  

## 2022-03-31 NOTE — Consult Note (Signed)
Chief Complaint: Patient was seen in consultation today for  port a cath placement  Referring Physician(s): Feng,Yan  Supervising Physician: Corrie Mckusick  Patient Status: [facility] - Out-pt  History of Present Illness: Jessica Livingston is an 81 y.o. female with past medical history of left breast DCIS 2022, coronary artery disease, depression, hyperlipidemia, obesity with prior lap band and newly diagnosed pancreatic cancer.  She presents today for Port-A-Cath placement to assist with treatment.  Past Medical History:  Diagnosis Date   breast cancer 2022   Left   CAD (coronary artery disease)    Cataract 2018   had sx   Depression    Heart disease '   History of radiation therapy    Left breast 05/21/21-06/18/21- Dr. Gery Pray   HLD (hyperlipidemia)    Obesity, unspecified    Other abnormal glucose    Other B-complex deficiencies    Other seborrheic keratosis    Pancreatic cancer ([place]) 02/2022   Postmenopausal     Past Surgical History:  Procedure Laterality Date   BREAST LUMPECTOMY WITH RADIOACTIVE SEED LOCALIZATION Left 04/16/2021   Procedure: LEFT BREAST LUMPECTOMY WITH RADIOACTIVE SEED LOCALIZATION;  Surgeon: Jovita Kussmaul, MD;  Location: [place];  Service: General;  Laterality: Left;   CARDIAC CATHETERIZATION  5/03   mild to mod single vessel disease   CHOLECYSTECTOMY     COLONOSCOPY  2018   HPP/TA   ESOPHAGOGASTRODUODENOSCOPY (EGD) WITH PROPOFOL N/A 03/18/2022   Procedure: ESOPHAGOGASTRODUODENOSCOPY (EGD) WITH PROPOFOL;  Surgeon: [name], MD;  Location: [facility];  Service: Gastroenterology;  Laterality: N/A;   EUS N/A 03/18/2022   Procedure: ESOPHAGEAL ENDOSCOPIC ULTRASOUND (EUS) RADIAL;  Surgeon: [name], MD;  Location: [facility];  Service: Gastroenterology;  Laterality: N/A;   FINE NEEDLE ASPIRATION N/A 03/18/2022   Procedure: FINE NEEDLE ASPIRATION (FNA) LINEAR;  Surgeon: [name], MD;  Location: [facility];  Service:  Gastroenterology;  Laterality: N/A;   LAPAROSCOPIC GASTRIC BANDING  01/13/2010   NECK SURGERY     POLYPECTOMY  2018   TONSILLECTOMY     TUBAL LIGATION Bilateral     Allergies: Atorvastatin and Rosuvastatin  Medications: Prior to Admission medications   Medication Sig Start Date End Date Taking? Authorizing Provider  Besifloxacin HCl (BESIVANCE) 0.6 % SUSP Place 1 drop into both eyes See admin instructions. Instill 1 drop into both eyes 3 times daily once a month on the day of monthly eye injections   Yes [provider]  Cyanocobalamin (VITAMIN B-12 PO) Take 2 tablets by mouth daily. gummy   Yes [provider]  HYDROcodone-acetaminophen (NORCO) 10-325 MG tablet Take 1 tablet by mouth every 6 (six) hours as needed for severe pain or moderate pain. 03/23/22  Yes Heilingoetter, Cassandra L, PA-C  morphine (MS CONTIN) 15 MG 12 hr tablet Take 1 tablet (15 mg total) by mouth every 12 (twelve) hours. 03/23/22  Yes Heilingoetter, Cassandra L, PA-C  omeprazole (PRILOSEC) 20 MG capsule Take 20 mg by mouth daily. 02/22/22  Yes [provider]  VITAMIN D PO Take 2 tablets by mouth daily. gummy   Yes [provider]  lidocaine-prilocaine (EMLA) cream Apply to affected area once 03/23/22   Truitt Merle, MD  ondansetron (ZOFRAN) 8 MG tablet Take 1 tablet (8 mg total) by mouth 2 (two) times daily as needed (Nausea or vomiting). 03/23/22   Truitt Merle, MD  ondansetron (ZOFRAN-ODT) 4 MG disintegrating tablet Take 1 tablet (4 mg total) by mouth every  8 (eight) hours as needed for nausea or vomiting. 03/11/22   Rancour, Annie Main, MD  prochlorperazine (COMPAZINE) 10 MG tablet Take 1 tablet (10 mg total) by mouth every 6 (six) hours as needed. 03/23/22   Heilingoetter, Cassandra L, PA-C  prochlorperazine (COMPAZINE) 10 MG tablet Take 1 tablet (10 mg total) by mouth every 6 (six) hours as needed (Nausea or vomiting). 03/23/22   Truitt Merle, MD     Family History  Problem Relation Age of  Onset   Heart attack Father 23   Heart disease Father    Aneurysm Father 76   Breast cancer Mother 69   Thyroid disease Mother 10   Hypertension Mother    Hyperlipidemia Mother    Lung cancer Mother 81   Kidney disease Mother 90   Diabetes Paternal Grandmother    Diabetes Brother    Colon polyps Neg Hx    Colon cancer Neg Hx    Esophageal cancer Neg Hx    Rectal cancer Neg Hx    Stomach cancer Neg Hx     Social History   Socioeconomic History   Marital status: Widowed    Spouse name: Not on file   Number of children: 3   Years of education: Not on file   Highest education level: Not on file  Occupational History   Occupation: retired    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  Tobacco Use   Smoking status: Never   Smokeless tobacco: Never  Vaping Use   Vaping Use: Never used  Substance and Sexual Activity   Alcohol use: Yes    Comment: rarely    Drug use: No   Sexual activity: Never  Other Topics Concern   Not on file  Social History Narrative   Married      3 children; 1 son-suicide      Retired      Geologist, 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   Social Determinants of Radio broadcast assistant Strain: Low Risk  (09/18/2021)   Overall Financial Resource Strain (CARDIA)    Difficulty of Paying Living Expenses: Not hard at all  Food Insecurity: No Food Insecurity (09/18/2021)   Hunger Vital Sign    Worried About Running Out of Food in the Last Year: Never true    [place] in the Last Year: Never true  Transportation Needs: No Transportation Needs (09/18/2021)   PRAPARE - Hydrologist (Medical): No    Lack of Transportation (Non-Medical): No  Physical Activity: Inactive (09/18/2021)   Exercise Vital Sign    Days of Exercise per Week: 0 days    Minutes of Exercise per Session: 0 min  Stress: No Stress Concern Present (09/18/2021)   [place]    Feeling of Stress : Not at all  Social Connections: Moderately Isolated (09/18/2021)   Social Connection and Isolation Panel [NHANES]    Frequency of Communication with Friends and Family: More than three times a week    Frequency of Social Gatherings with Friends and Family: Three times a week    Attends Religious Services: 1 to 4 times per year    Active Member of Clubs or Organizations: No    Attends Archivist Meetings: Never    Marital Status: Widowed      Review of Systems denies fever, headache, chest  pain, worsening dyspnea, cough, back pain, nausea, vomiting or bleeding.  She does have epigastric discomfort  Vital Signs: BP (!) 161/89   Pulse 83   Temp 98.4 F (36.9 C) (Oral)   Resp 18   Ht '5\' 3"'$  (1.6 m)   Wt 200 lb 13.4 oz (91.1 kg)   SpO2 97%   BMI 35.58 kg/m      Physical Exam awake, alert.  Chest with sl diminished breath sounds bases.  Heart with regular rate and rhythm.  Abdomen soft, positive bowel sounds, tender epigastric region to palpation.  No lower extremity edema  Imaging: CT Chest W Contrast  Result Date: 03/30/2022 CLINICAL DATA:  Pancreatic cancer. Chest staging. Additional history of left breast cancer. * Tracking Code: BO * EXAM: CT CHEST WITH CONTRAST TECHNIQUE: Multidetector CT imaging of the chest was performed during intravenous contrast administration. RADIATION DOSE REDUCTION: This exam was performed according to the departmental dose-optimization program which includes automated exposure control, adjustment of the mA and/or kV according to patient size and/or use of iterative reconstruction technique. CONTRAST:  61m OMNIPAQUE IOHEXOL 300 MG/ML  SOLN COMPARISON:  03/11/2022 CT abdomen/pelvis. FINDINGS: Cardiovascular: Normal heart size. No significant pericardial effusion/thickening. Left anterior descending and left circumflex coronary atherosclerosis. Atherosclerotic nonaneurysmal thoracic aorta.  Dilated main pulmonary artery (3.6 cm diameter). No central pulmonary emboli. Mediastinum/Nodes: No discrete thyroid nodules. Unremarkable esophagus. No pathologically enlarged axillary, mediastinal or hilar lymph nodes. Lungs/Pleura: No pneumothorax. No pleural effusion. No acute consolidative airspace disease or lung masses. Four scattered solid pulmonary nodules in both lungs, largest 0.9 cm in the medial basilar right lower lobe (series 7/image 87) and 0.6 cm in the medial posterior left lower lobe (series 7/image 61). Upper abdomen: Small hiatal hernia. Laparoscopic gastric band in place in the gastric cardia region with intact visualized tubing connecting to ventral right abdominal subcutaneous port. Cholecystectomy. Hypodense 2.4 cm pancreatic head mass (series 2/image 146) with prominently dilated main pancreatic duct (11 mm diameter) and associated pancreatic parenchymal atrophy in the pancreatic body and tail, unchanged from recent CT abdomen study. Musculoskeletal: No aggressive appearing focal osseous lesions. Partially visualized surgical hardware from ACDF in the lower cervical spine. Moderate thoracic spondylosis. IMPRESSION: 1. Four scattered solid pulmonary nodules in both lungs, largest 0.9 cm in the medial basilar right lower lobe, suspicious for pulmonary metastases. 2. No thoracic adenopathy. 3. Hypodense 2.4 cm pancreatic head mass with prominently dilated main pancreatic duct and associated pancreatic parenchymal atrophy in the pancreatic body and tail, unchanged from recent CT abdomen study, compatible with known pancreatic malignancy. 4. Small hiatal hernia. 5. Dilated main pulmonary artery, suggesting pulmonary arterial hypertension. 6. Two-vessel coronary atherosclerosis. 7. Aortic Atherosclerosis (ICD10-I70.0). Electronically Signed   By: JIlona SorrelM.D.   On: 03/30/2022 13:33   CT ABDOMEN PELVIS W CONTRAST  Result Date: 03/11/2022 CLINICAL DATA:  Abdominal pain; pancreatic mass  EXAM: CT ABDOMEN AND PELVIS WITH CONTRAST TECHNIQUE: Multidetector CT imaging of the abdomen and pelvis was performed using the standard protocol following bolus administration of intravenous contrast. RADIATION DOSE REDUCTION: This exam was performed according to the departmental dose-optimization program which includes automated exposure control, adjustment of the mA and/or kV according to patient size and/or use of iterative reconstruction technique. CONTRAST:  1033mOMNIPAQUE IOHEXOL 300 MG/ML  SOLN COMPARISON:  CT abdomen and pelvis dated March 04, 2022 FINDINGS: Lower chest: No acute abnormality.  Coronary artery calcifications. Hepatobiliary: Mild hepatic steatosis. No focal liver abnormality is seen. Status post cholecystectomy. No biliary  dilatation. Pancreas: Stable hypoattenuating lesion of the pancreatic head with associated main pancreatic duct dilation, unchanged complete encasement and severe narrowing/occlusion of the splenoportal confluence and SMV. No evidence of arterial involvement. 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 Spleen: Normal in size without focal abnormality. Adrenals/Urinary Tract: Bilateral adrenal glands are unremarkable. Kidneys enhance symmetrically with no evidence of hydronephrosis or nephrolithiasis. Bladder is unremarkable. Stomach/Bowel: Prior partial sigmoid resection. Diverticulosis. Normal appendix. Small hiatal hernia with gastric band in place. No bowel wall thickening, inflammatory change or evidence of obstruction. Vascular/Lymphatic: Aortic atherosclerosis. Vascular involvement related to pancreatic lesion as above. No enlarged abdominal or pelvic lymph nodes. Reproductive: Uterus and bilateral adnexa are unremarkable. Other: No abdominal wall hernia or abnormality. No abdominopelvic ascites. Musculoskeletal: Mild compression deformity of L3, unchanged when compared to prior. No aggressive appearing  osseous lesions. IMPRESSION: 1. No acute findings in the abdomen or pelvis. 2. Unchanged pancreatic head lesion which is highly suspicious for adenocarcinoma. Stable pancreatic ductal dilation. No biliary ductal dilation. 3. Coronary artery calcifications and aortic Atherosclerosis (ICD10-I70.0). Electronically Signed   By: Yetta Glassman M.D.   On: 03/11/2022 10:32   CT ABDOMEN W CONTRAST  Result Date: 03/05/2022 CLINICAL DATA:  Left-sided abdominal pain for 2 months. Prior lap band. Cholecystectomy. Left breast cancer with lumpectomy and radiation therapy. EXAM: CT ABDOMEN WITH CONTRAST TECHNIQUE: Multidetector CT imaging of the abdomen was performed using the standard protocol following bolus administration of intravenous contrast. RADIATION DOSE REDUCTION: This exam was performed according to the departmental dose-optimization program which includes automated exposure control, adjustment of the mA and/or kV according to patient size and/or use of iterative reconstruction technique. CONTRAST:  165m ISOVUE-300 IOPAMIDOL (ISOVUE-300) INJECTION 61% COMPARISON:  01/27/2022 abdominal radiograph. No prior CT. FINDINGS: Lower chest: Mild right hemidiaphragm elevation. Clear lung bases. Mild cardiomegaly. Left main and LAD coronary artery calcification. Hepatobiliary: Possible hepatic steatosis. Caudate lobe enlargement with subtle irregular hepatic capsule. Medial segment left liver lobe atrophy. Probable focal steatosis within the anterior aspect of segment 4A including on 20/2. Cholecystectomy, without biliary ductal dilatation. Pancreas: The pancreas is atrophic. Central hypoattenuation is favored to represent main and possibly side branch duct dilatation. Example within the neck at 9 mm on 31/2. Followed to the level of the pancreatic head, where a border deforming centrally hypoattenuating lesion measures 2.5 x 2.4 cm on 33/2. A component or less likely an adjacent node extends caudally at 1.6 cm on 38/2 and  coronal image 67. Pancreatic uncinate process probable side branch duct ectasia is well on 36/2. Spleen: Old granulomatous disease within Adrenals/Urinary Tract: Normal adrenal glands. Mild renal cortical thinning bilaterally. No hydronephrosis. Stomach/Bowel: Status post lap band, appropriately positioned. Normal colon and terminal ileum. Normal small bowel. Vascular/Lymphatic: Aortic atherosclerosis. No arterial involvement by tumor. The splenoportal confluence and SMV are involved with tumor. Example 32/2 and 34/2 respectively. Narrowing of the splenoportal confluence on coronal image 77. Retroaortic left renal vein. Otherwise, no abdominal adenopathy. Other: No ascites.  No evidence of omental or peritoneal disease. Musculoskeletal: Left breast skin thickening is mild. Surgical clips likely related to lumpectomy. Schmorl's node versus mild compression deformity at L4. IMPRESSION: 1. Findings highly suspicious for adenocarcinoma in the pancreatic head with resultant upstream atrophy and duct dilatation. Venous encasement as detailed above. No arterial involvement. 2. Suspect cirrhosis and mild steatosis. 3. Coronary artery atherosclerosis. Aortic Atherosclerosis (ICD10-I70.0). These results will be called to the ordering clinician or representative by  the Psychologist, clinical, and communication documented in the PACS or [employer]. Electronically Signed   By: Abigail Miyamoto M.D.   On: 03/05/2022 11:43    Labs:  CBC: Recent Labs    04/08/21 1030 03/09/22 1218 03/11/22 0857  WBC 6.3 5.3 5.2  HGB 15.4* 14.3 14.4  HCT 47.2* 42.4 43.5  PLT 220 201.0 189    COAGS: No results for input(s): "INR", "APTT" in the last 8760 hours.  BMP: Recent Labs    04/08/21 1030 03/09/22 1218 03/11/22 0857  NA 139 138 141  K 3.8 3.4* 3.5  CL 103 102 105  CO2 '29 26 28  '$ GLUCOSE 112* 140* 172*  BUN '9 12 11  '$ CALCIUM 9.1 9.0 8.9  CREATININE 0.82 0.70 0.66  GFRNONAA >60  --  >60    LIVER FUNCTION  TESTS: Recent Labs    03/09/22 1218 03/11/22 0857  BILITOT 0.6 0.6  AST 21 26  ALT 18 23  ALKPHOS 90 83  PROT 7.0 7.0  ALBUMIN 4.2 3.9    TUMOR MARKERS: Recent Labs    03/09/22 1218  AFPTM 9.2*    Assessment and Plan: 81 y.o. female with past medical history of left breast DCIS 2022, coronary artery disease, depression, hyperlipidemia, obesity with prior lap band and newly diagnosed pancreatic cancer.  She presents today for Port-A-Cath placement to assist with treatment.Risks and benefits of image guided port-a-catheter placement was discussed with the patient including, but not limited to bleeding, infection, pneumothorax, or fibrin sheath development and need for additional procedures.  All of the patient's questions were answered, patient is agreeable to proceed. Consent signed and in chart.    Thank you for this interesting consult.  I greatly enjoyed meeting Jessica Livingston and look forward to participating in their care.  A copy of this report was sent to the requesting provider on this date.  Electronically Signed: D. Rowe Robert, PA-C 03/31/2022, 12:42 PM   I spent a total of  25 minutes   in face to face in clinical consultation, greater than 50% of which was counseling/coordinating care for port a cath placement

## 2022-04-01 ENCOUNTER — Encounter: Payer: Self-pay | Admitting: Hematology

## 2022-04-01 NOTE — Progress Notes (Signed)
Called pt to introduce myself as her Arboriculturist and to discuss the [employer].  Unfortunately there aren't foundations offering copay assistance for her Dx and the type of ins she has.  I left a msg requesting she return my call if she's interested in applying for the grant.

## 2022-04-02 ENCOUNTER — Inpatient Hospital Stay: Payer: PPO

## 2022-04-02 ENCOUNTER — Inpatient Hospital Stay (HOSPITAL_BASED_OUTPATIENT_CLINIC_OR_DEPARTMENT_OTHER): Payer: PPO | Admitting: Hematology

## 2022-04-02 ENCOUNTER — Other Ambulatory Visit: Payer: Self-pay

## 2022-04-02 ENCOUNTER — Encounter: Payer: Self-pay | Admitting: Hematology

## 2022-04-02 VITALS — BP 142/78 | HR 87 | Temp 98.5°F | Resp 16 | Wt 190.6 lb

## 2022-04-02 DIAGNOSIS — Z95828 Presence of other vascular implants and grafts: Secondary | ICD-10-CM

## 2022-04-02 DIAGNOSIS — Z5111 Encounter for antineoplastic chemotherapy: Secondary | ICD-10-CM | POA: Diagnosis not present

## 2022-04-02 DIAGNOSIS — C250 Malignant neoplasm of head of pancreas: Secondary | ICD-10-CM

## 2022-04-02 DIAGNOSIS — Z79899 Other long term (current) drug therapy: Secondary | ICD-10-CM | POA: Diagnosis not present

## 2022-04-02 DIAGNOSIS — Z9884 Bariatric surgery status: Secondary | ICD-10-CM | POA: Diagnosis not present

## 2022-04-02 DIAGNOSIS — Z803 Family history of malignant neoplasm of breast: Secondary | ICD-10-CM | POA: Diagnosis not present

## 2022-04-02 DIAGNOSIS — Z86000 Personal history of in-situ neoplasm of breast: Secondary | ICD-10-CM | POA: Diagnosis not present

## 2022-04-02 DIAGNOSIS — Z923 Personal history of irradiation: Secondary | ICD-10-CM | POA: Diagnosis not present

## 2022-04-02 DIAGNOSIS — Z79891 Long term (current) use of opiate analgesic: Secondary | ICD-10-CM | POA: Diagnosis not present

## 2022-04-02 LAB — CBC WITH DIFFERENTIAL (CANCER CENTER ONLY)
Abs Immature Granulocytes: 0.02 10*3/uL (ref 0.00–0.07)
Basophils Absolute: 0 10*3/uL (ref 0.0–0.1)
Basophils Relative: 1 %
Eosinophils Absolute: 0.1 10*3/uL (ref 0.0–0.5)
Eosinophils Relative: 1 %
HCT: 44 % (ref 36.0–46.0)
Hemoglobin: 15.1 g/dL — ABNORMAL HIGH (ref 12.0–15.0)
Immature Granulocytes: 0 %
Lymphocytes Relative: 16 %
Lymphs Abs: 1.1 10*3/uL (ref 0.7–4.0)
MCH: 30.8 pg (ref 26.0–34.0)
MCHC: 34.3 g/dL (ref 30.0–36.0)
MCV: 89.6 fL (ref 80.0–100.0)
Monocytes Absolute: 0.8 10*3/uL (ref 0.1–1.0)
Monocytes Relative: 12 %
Neutro Abs: 4.4 10*3/uL (ref 1.7–7.7)
Neutrophils Relative %: 70 %
Platelet Count: 182 10*3/uL (ref 150–400)
RBC: 4.91 MIL/uL (ref 3.87–5.11)
RDW: 12.9 % (ref 11.5–15.5)
WBC Count: 6.4 10*3/uL (ref 4.0–10.5)
nRBC: 0 % (ref 0.0–0.2)

## 2022-04-02 LAB — CMP (CANCER CENTER ONLY)
ALT: 26 U/L (ref 0–44)
AST: 33 U/L (ref 15–41)
Albumin: 4.1 g/dL (ref 3.5–5.0)
Alkaline Phosphatase: 101 U/L (ref 38–126)
Anion gap: 8 (ref 5–15)
BUN: 17 mg/dL (ref 8–23)
CO2: 28 mmol/L (ref 22–32)
Calcium: 8.9 mg/dL (ref 8.9–10.3)
Chloride: 99 mmol/L (ref 98–111)
Creatinine: 0.69 mg/dL (ref 0.44–1.00)
GFR, Estimated: >60 mL/min (ref >60)
Glucose, Bld: 182 mg/dL — ABNORMAL HIGH (ref 70–99)
Potassium: 3.3 mmol/L — ABNORMAL LOW (ref 3.5–5.1)
Sodium: 135 mmol/L (ref 135–145)
Total Bilirubin: 1.2 mg/dL (ref 0.3–1.2)
Total Protein: 7.2 g/dL (ref 6.5–8.1)

## 2022-04-02 MED ORDER — HEPARIN SOD (PORK) LOCK FLUSH 100 UNIT/ML IV SOLN
500.0000 [IU] | Freq: Once | INTRAVENOUS | Status: AC | PRN
  Administered 2022-04-02: 500 [IU]

## 2022-04-02 MED ORDER — SODIUM CHLORIDE 0.9% FLUSH
10.0000 mL | INTRAVENOUS | Status: DC | PRN
  Administered 2022-04-02: 10 mL

## 2022-04-02 MED ORDER — SODIUM CHLORIDE 0.9 % IV SOLN
1000.0000 mg/m2 | Freq: Once | INTRAVENOUS | Status: AC
  Administered 2022-04-02: 2014 mg via INTRAVENOUS
  Filled 2022-04-02: qty 52.97

## 2022-04-02 MED ORDER — PROCHLORPERAZINE MALEATE 10 MG PO TABS
10.0000 mg | ORAL_TABLET | Freq: Once | ORAL | Status: AC
  Administered 2022-04-02: 10 mg via ORAL
  Filled 2022-04-02: qty 1

## 2022-04-02 MED ORDER — SODIUM CHLORIDE 0.9 % IV SOLN: Freq: Once | INTRAVENOUS | Status: AC

## 2022-04-02 MED ORDER — SODIUM CHLORIDE 0.9% FLUSH
10.0000 mL | INTRAVENOUS | Status: AC | PRN
  Administered 2022-04-02: 10 mL

## 2022-04-02 MED ORDER — PACLITAXEL PROTEIN-BOUND CHEMO INJECTION 100 MG
100.0000 mg/m2 | Freq: Once | INTRAVENOUS | Status: AC
  Administered 2022-04-02: 200 mg via INTRAVENOUS
  Filled 2022-04-02: qty 40

## 2022-04-02 NOTE — Progress Notes (Signed)
[place]   [phone] [phone]   Clinic Follow up Note   Patient Care Team: Deland Pretty, MD as PCP - General (Internal Medicine) Mauro Kaufmann, RN as Oncology Nurse Navigator Rockwell Germany, RN as Oncology Nurse Navigator Jovita Kussmaul, MD as Consulting Physician (General Surgery) Nicholas Lose, MD as Consulting Physician (Hematology and Oncology) Gery Pray, MD as Consulting Physician (Radiation Oncology) Delice Bison, Charlestine Massed, NP as Nurse Practitioner (Hematology and Oncology) Harmon Pier, RN as Registered Nurse Truitt Merle, MD as Consulting Physician (Oncology) Royston Bake, RN as Oncology Nurse Navigator (Oncology)  Date of Service:  04/02/2022  CHIEF COMPLAINT: f/u of pancreatic cancer  CURRENT THERAPY:  First line chemo Abraxane/Gemcitabine, q14d, starting 04/02/22  ASSESSMENT & PLAN:  Jessica Livingston is a 81 y.o. female with   1. Pancreatic adenocarcinoma, cT2N1Mx, with indeterminate lung nodules -presented with abdominal discomfort, back pain, and nausea/vomiting. Abdomen CT scan on 03/04/22 showed a pancreatic head mass with venous encasement.   -baseline CA 19.9 1,715, AFP 9.2 on 03/09/22 -EUS/FNA on 03/18/22 showed 3.4 cm in the uncinate/head of the pancreas, surrounding portal vein, and causing mild biliary obstruction. Cytology confirmed adenocarcinoma. -staging chest CT on 03/30/22 showed four subcentimeter pulmonary nodules in both lungs, with largest 9 mm, no thoracic adenopathy.  I have personally reviewed her CT chest, lower lung nodules are indeterminate, but suspicious for pulmonary metastasis.  I discussed the findings with patient and her daughter in detail today.  -she is scheduled to meet Dr. Zenia Resides on 04/12/22 to determine if she is eligible for resection. -she had port placement on 03/31/22 in anticipation of beginning abraxane/gemcitabine today, 8/4. -labs reviewed, overall WNL, adequate to start treatment today.   I again reviewed the potential side effect from chemotherapy and management, both patient and her daughter voiced good understanding and knows to call us if she has any questions after chemo.   2. H/o Left breast DCIS, ER+/PR+, grade 3 -diagnosed in 01/2021. S/p lumpectomy 04/16/21, path showed DCIS. Treated with adjuvant radiation 05/22/21 - 06/18/21. She declined antiestrogen therapy.   3. Genetics -she reports breast cancer in her mother at age 86. -blood test obtained 03/23/22, she is scheduled to meet genetic counselor Roma Kayser on 04/14/22. -The patient has 2 living children   4. Symptoms -Patient presented with abdominal and back pain. -Currently using MS Contin and Norco   5. Social -[place]  [place] are 4 houses on the property and family lives in the other homes on the property. -The patient lives in 30 of these houses alone.  She is independent with her activities of daily living and is active mowing her lawn as well as attending church   6. Comorbidities (hepatic cirrhosis, hx Lap-Band surgery): -Hepatic steatosis noted incidentally on staging CT scan. 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  No evidence of decompensation/complications from this at this time -History of lap band surgery    PLAN: -proceed with first gem/abraxane today as scheduled, with slight dose reduction of Abraxane to 100 mg/m -consult with Dr. Zenia Resides 8/14 -genetic counseling 8/16 -lab, flush, f/u, and gem/abraxane 8/18   No problem-specific Assessment & Plan notes found for this encounter.   SUMMARY OF ONCOLOGIC HISTORY: Oncology History  Ductal carcinoma in situ (DCIS) of left breast  02/17/2021 Initial Diagnosis   Screening mammogram detected left breast calcifications lower inner quadrant 0.9  cm: Biopsy revealed high-grade DCIS with necrosis and calcifications, ER 30%, PR 50% weak   02/25/2021  Cancer Staging   Staging form: Breast, AJCC 8th Edition - Clinical stage from 02/25/2021: Stage 0 (cTis (DCIS), cN0, cM0, G3, ER+, PR+, HER2: Not Assessed) - Signed by Nicholas Lose, MD on 02/25/2021 Stage prefix: Initial diagnosis Histologic grading system: 3 grade system   04/16/2021 Cancer Staging   Staging form: Breast, AJCC 8th Edition - Pathologic stage from 04/16/2021: Stage 0 (pTis (DCIS), pN0, cM0, ER+, PR+) - Signed by Gardenia Phlegm, NP on 09/04/2021 Stage prefix: Initial diagnosis Nuclear grade: G3   04/16/2021 Surgery   Left breast lumpectomy with radioactive seed localization   05/21/2021 - 06/18/2021 Radiation Therapy    Radiation Treatment Dates: 05/21/2021 through 06/18/2021 Site Technique Total Dose (Gy) Dose per Fx (Gy) Completed Fx Beam Energies  Breast, Left: Breast_Lt 3D 40.05/40.05 2.67 15/15 6X, 10X  Breast, Left: Breast_Lt_Bst 3D 10/10 2 5/5 6X    Pancreatic cancer ([place])  03/04/2022 Imaging   CT ABDOMEN W CONTRAST   IMPRESSION: 1. Findings highly suspicious for adenocarcinoma in the pancreatic head with resultant upstream atrophy and duct dilatation. Venous encasement as detailed above. No arterial involvement. 2. Suspect cirrhosis and mild steatosis. 3. Coronary artery atherosclerosis. Aortic Atherosclerosis (ICD10-I70.0).   03/09/2022 Tumor Marker   Patient's tumor was tested for the following markers: CA-19.9. Results of the tumor marker test revealed 1715.   03/18/2022 Procedure   EUS-Dr. Ardis Hughs  1. Irregularly shaped, indistinctly bordered, heterogeneous, hypoechoic mass that measures 3.4 cm maximally in the uncinate/head of pancreas. The mass clearly surrounds and attenuates the portal vein near the splenic vein, SMV confluence. The mass is causing mild biliary duct obstruction with the common bile duct measuring 8.6 mm. The mass is also obstructing and dilating the main pancreatic duct which measures up to 9 millimeters in the body and tail. I  used a 25-gauge EUS FNB needle to sample the lesion with a transduodenal approach, 2 passes. 2. No peripancreatic adenopathy 3. Limited views of the liver, spleen were normal   03/18/2022 Pathology Results   CYTOLOGY - NON PAP  CASE: [id number]  PATIENT: Jessica Livingston  Non-Gynecological Cytology Report   Clinical History: Cirrhosis, pancreatic mass  Specimen Submitted:  A. PANCREAS, HEAD, FINE NEEDLE ASPIRATION:    FINAL MICROSCOPIC DIAGNOSIS:  - Malignant cells consistent with adenocarcinoma   SPECIMEN ADEQUACY:  Satisfactory for evaluation   IMMEDIATE EVALUATION:  SUFFICIENT, POSITIVE FOR MALIGNANCY CONSISTENT WITH ADENO CA. (MPL)    03/23/2022 Initial Diagnosis   Pancreatic cancer ([place])   04/02/2022 -  Chemotherapy   Patient is on Treatment Plan : PANCREATIC Abraxane / Gemcitabine D1,15 q28d        INTERVAL HISTORY:  Jessica Livingston is here for a follow up of pancreatic cancer. She was last seen by me with PA Cassie on 03/23/22. She presents to the clinic accompanied by her daughter. She reports she is stable today, no new concerns.   All other systems were reviewed with the patient and are negative.  MEDICAL HISTORY:  Past Medical History:  Diagnosis Date   breast cancer 2022   Left   CAD (coronary artery disease)    Cataract 2018   had sx   Depression    Heart disease '   History of radiation therapy    Left breast 05/21/21-06/18/21- Dr. Gery Pray   HLD (hyperlipidemia)    Obesity, unspecified    Other abnormal glucose  Other B-complex deficiencies    Other seborrheic keratosis    Pancreatic cancer ([place]) 02/2022   Postmenopausal     SURGICAL HISTORY: Past Surgical History:  Procedure Laterality Date   BREAST LUMPECTOMY WITH RADIOACTIVE SEED LOCALIZATION Left 04/16/2021   Procedure: LEFT BREAST LUMPECTOMY WITH RADIOACTIVE SEED LOCALIZATION;  Surgeon: Jovita Kussmaul, MD;  Location: [facility];  Service: General;  Laterality: Left;   CARDIAC  CATHETERIZATION  5/03   mild to mod single vessel disease   CHOLECYSTECTOMY     COLONOSCOPY  2018   HPP/TA   ESOPHAGOGASTRODUODENOSCOPY (EGD) WITH PROPOFOL N/A 03/18/2022   Procedure: ESOPHAGOGASTRODUODENOSCOPY (EGD) WITH PROPOFOL;  Surgeon: [name], MD;  Location: [facility];  Service: Gastroenterology;  Laterality: N/A;   EUS N/A 03/18/2022   Procedure: ESOPHAGEAL ENDOSCOPIC ULTRASOUND (EUS) RADIAL;  Surgeon: [name], MD;  Location: [facility];  Service: Gastroenterology;  Laterality: N/A;   FINE NEEDLE ASPIRATION N/A 03/18/2022   Procedure: FINE NEEDLE ASPIRATION (FNA) LINEAR;  Surgeon: [name], MD;  Location: [facility];  Service: Gastroenterology;  Laterality: N/A;   IR IMAGING GUIDED PORT INSERTION  03/31/2022   LAPAROSCOPIC GASTRIC BANDING  01/13/2010   NECK SURGERY     POLYPECTOMY  2018   TONSILLECTOMY     TUBAL LIGATION Bilateral     I have reviewed the social history and family history with the patient and they are unchanged from previous note.  ALLERGIES:  is allergic to atorvastatin and rosuvastatin.  MEDICATIONS:  Current Outpatient Medications  Medication Sig Dispense Refill   Besifloxacin HCl (BESIVANCE) 0.6 % SUSP Place 1 drop into both eyes See admin instructions. Instill 1 drop into both eyes 3 times daily once a month on the day of monthly eye injections     Cyanocobalamin (VITAMIN B-12 PO) Take 2 tablets by mouth daily. gummy     HYDROcodone-acetaminophen (NORCO) 10-325 MG tablet Take 1 tablet by mouth every 6 (six) hours as needed for severe pain or moderate pain. 30 tablet 0   lidocaine-prilocaine (EMLA) cream Apply to affected area once 30 g 3   morphine (MS CONTIN) 15 MG 12 hr tablet Take 1 tablet (15 mg total) by mouth every 12 (twelve) hours. 30 tablet 0   omeprazole (PRILOSEC) 20 MG capsule Take 20 mg by mouth daily.     ondansetron (ZOFRAN) 8 MG tablet Take 1 tablet (8 mg total) by mouth 2 (two) times daily as needed (Nausea or  vomiting). 30 tablet 1   ondansetron (ZOFRAN-ODT) 4 MG disintegrating tablet Take 1 tablet (4 mg total) by mouth every 8 (eight) hours as needed for nausea or vomiting. 20 tablet 0   prochlorperazine (COMPAZINE) 10 MG tablet Take 1 tablet (10 mg total) by mouth every 6 (six) hours as needed. 30 tablet 2   prochlorperazine (COMPAZINE) 10 MG tablet Take 1 tablet (10 mg total) by mouth every 6 (six) hours as needed (Nausea or vomiting). 30 tablet 1   VITAMIN D PO Take 2 tablets by mouth daily. gummy     No current facility-administered medications for this visit.   Facility-Administered Medications Ordered in Other Visits  Medication Dose Route Frequency Provider Last Rate Last Admin   gemcitabine (GEMZAR) 2,014 mg in sodium chloride 0.9 % 250 mL chemo infusion  1,000 mg/m2 (Treatment Plan Recorded) Intravenous Once Truitt Merle, MD 606 mL/hr at 04/02/22 1108 2,014 mg at 04/02/22 1108   heparin lock flush 100 unit/mL  500 Units Intracatheter Once PRN Truitt Merle,  MD       sodium chloride flush (NS) 0.9 % injection 10 mL  10 mL Intracatheter PRN Truitt Merle, MD        PHYSICAL EXAMINATION: ECOG PERFORMANCE STATUS: 1 - Symptomatic but completely ambulatory  Vitals:   04/02/22 0902  BP: (!) 142/78  Pulse: 87  Resp: 16  Temp: 98.5 F (36.9 C)  SpO2: 94%   Wt Readings from Last 3 Encounters:  04/02/22 190 lb 9.6 oz (86.5 kg)  03/31/22 200 lb 13.4 oz (91.1 kg)  03/23/22 200 lb 14.4 oz (91.1 kg)     GENERAL:alert, no distress and comfortable SKIN: skin color normal, no rashes or significant lesions EYES: normal, Conjunctiva are pink and non-injected, sclera clear  NEURO: alert & oriented x 3 with fluent speech  LABORATORY DATA:  I have reviewed the data as listed    Latest Ref Rng & Units 04/02/2022    8:25 AM 03/11/2022    8:57 AM 03/09/2022   12:18 PM  CBC  WBC 4.0 - 10.5 K/uL 6.4  5.2  5.3   Hemoglobin 12.0 - 15.0 g/dL 15.1  14.4  14.3   Hematocrit 36.0 - 46.0 % 44.0  43.5  42.4    Platelets 150 - 400 K/uL 182  189  201.0         Latest Ref Rng & Units 04/02/2022    8:25 AM 03/11/2022    8:57 AM 03/09/2022   12:18 PM  CMP  Glucose 70 - 99 mg/dL 182  172  140   BUN 8 - 23 mg/dL _0 Creatinine 0.44 - 1.00 mg/dL 0.69  0.66  0.70   Sodium 135 - 145 mmol/L 135  141  138   Potassium 3.5 - 5.1 mmol/L 3.3  3.5  3.4   Chloride 98 - 111 mmol/L 99  105  102   CO2 22 - 32 mmol/L _1 Calcium 8.9 - 10.3 mg/dL 8.9  8.9  9.0   Total Protein 6.5 - 8.1 g/dL 7.2  7.0  7.0   Total Bilirubin 0.3 - 1.2 mg/dL 1.2  0.6  0.6   Alkaline Phos 38 - 126 U/L 101  83  90   AST 15 - 41 U/L 33  26  21   ALT 0 - 44 U/L _2 RADIOGRAPHIC STUDIES: I have personally reviewed the radiological images as listed and agreed with the findings in the report. IR IMAGING GUIDED PORT INSERTION  Result Date: 03/31/2022 INDICATION: 81 year old female referred for port catheter EXAM: IMAGE GUIDED PORT CATHETER MEDICATIONS: None ANESTHESIA/SEDATION: Moderate (conscious) sedation was employed during this procedure. A total of Versed 2.0 mg and Fentanyl 100 mcg was administered intravenously. Moderate Sedation Time: 19 minutes. The patient's level of consciousness and vital signs were monitored continuously by radiology nursing throughout the procedure under my direct supervision. FLUOROSCOPY TIME:  Fluoroscopy Time:   (2 mGy). COMPLICATIONS: None PROCEDURE: The procedure, risks, benefits, and alternatives were explained to the patient. Questions regarding the procedure were encouraged and answered. The patient understands and consents to the procedure. Ultrasound survey was performed with images stored and sent to PACs. Right IJ vein documented to be patent. The right neck and chest was prepped with chlorhexidine, and draped in the usual sterile fashion using maximum barrier technique (cap and mask, sterile gown, sterile gloves, large sterile sheet, hand hygiene and cutaneous  antiseptic). Local anesthesia was attained by infiltration with 1% lidocaine without epinephrine. Ultrasound demonstrated patency of the right internal jugular vein, and this was documented with an image. Under real-time ultrasound guidance, this vein was accessed with a 21 gauge micropuncture needle and image documentation was performed. A small dermatotomy was made at the access site with an 11 scalpel. A 0.018" wire was advanced into the SVC and used to estimate the length of the internal catheter. The access needle exchanged for a 9F micropuncture vascular sheath. The 0.018" wire was then removed and a 0.035" wire advanced into the IVC. An appropriate location for the subcutaneous reservoir was selected below the clavicle and an incision was made through the skin and underlying soft tissues. The subcutaneous tissues were then dissected using a combination of blunt and sharp surgical technique and a pocket was formed. A single lumen power injectable portacatheter was then tunneled through the subcutaneous tissues from the pocket to the dermatotomy and the port reservoir placed within the subcutaneous pocket. The venous access site was then serially dilated and a peel away vascular sheath placed over the wire. The wire was removed and the port catheter advanced into position under fluoroscopic guidance. The catheter tip is positioned at the superior cavoatrial junction. This was documented with a spot image. The portacatheter was then tested and found to flush and aspirate well. The port was flushed with saline followed by 100 units/mL heparinized saline. The pocket was then closed in two layers using first subdermal inverted interrupted absorbable sutures followed by a running subcuticular suture. The epidermis was then sealed with Dermabond. Steri-Strips were placed. The dermatotomy at the venous access site was also seal with Dermabond. Patient tolerated the procedure well and remained hemodynamically stable  throughout. No complications encountered and no significant blood loss encountered IMPRESSION: Status post right IJ port catheter placement. Signed, Dulcy Fanny. Nadene Rubins, RPVI [facility] Electronically Signed   By: Corrie Mckusick D.O.   On: 03/31/2022 14:27      No orders of the defined types were placed in this encounter.  All questions were answered. The patient knows to call the clinic with any problems, questions or concerns. No barriers to learning was detected. The total time spent in the appointment was 30 minutes.     Truitt Merle, MD 04/02/2022   I, Wilburn Mylar, am acting as scribe for Truitt Merle, MD.   I have reviewed the above documentation for accuracy and completeness, and I agree with the above.

## 2022-04-02 NOTE — Patient Instructions (Addendum)
[facility]  Discharge Instructions: Thank you for choosing [facility] to provide your oncology and hematology care.   If you have a lab appointment with the [facility], please go directly to the [facility] and check in at the registration area.   Wear comfortable clothing and clothing appropriate for easy access to any Portacath or PICC line.   We strive to give you quality time with your provider. You may need to reschedule your appointment if you arrive late (15 or more minutes).  Arriving late affects you and other patients whose appointments are after yours.  Also, if you miss three or more appointments without notifying the office, you may be dismissed from the clinic at the provider's discretion.      For prescription refill requests, have your pharmacy contact our office and allow 72 hours for refills to be completed.    Today you received the following chemotherapy and/or immunotherapy agents :  Abraxane,  Gemcitabine.   To help prevent nausea and vomiting after your treatment, we encourage you to take your nausea medication as directed.  BELOW ARE SYMPTOMS THAT SHOULD BE REPORTED IMMEDIATELY: *FEVER GREATER THAN 100.4 F (38 C) OR HIGHER *CHILLS OR SWEATING *NAUSEA AND VOMITING THAT IS NOT CONTROLLED WITH YOUR NAUSEA MEDICATION *UNUSUAL SHORTNESS OF BREATH *UNUSUAL BRUISING OR BLEEDING *URINARY PROBLEMS (pain or burning when urinating, or frequent urination) *BOWEL PROBLEMS (unusual diarrhea, constipation, pain near the anus) TENDERNESS IN MOUTH AND THROAT WITH OR WITHOUT PRESENCE OF ULCERS (sore throat, sores in mouth, or a toothache) UNUSUAL RASH, SWELLING OR PAIN  UNUSUAL VAGINAL DISCHARGE OR ITCHING   Items with * indicate a potential emergency and should be followed up as soon as possible or go to the Emergency Department if any problems should occur.  Please show the CHEMOTHERAPY ALERT CARD or IMMUNOTHERAPY ALERT CARD at  check-in to the Emergency Department and triage nurse.  Should you have questions after your visit or need to cancel or reschedule your appointment, please contact [facility]  Dept: [phone]  and follow the prompts.  Office hours are 8:00 a.m. to 4:30 p.m. Monday - Friday. Please note that voicemails left after 4:00 p.m. may not be returned until the following business day.  We are closed weekends and major holidays. You have access to a nurse at all times for urgent questions. Please call the main number to the clinic Dept: [phone] and follow the prompts.   For any non-urgent questions, you may also contact your provider using MyChart. We now offer e-Visits for anyone 23 and older to request care online for non-urgent symptoms. For details visit [email or web address].   Also download the MyChart app! Go to the app store, search "MyChart", open the app, select [facility], and log in with your MyChart username and password.  Masks are optional in the cancer centers. If you would like for your care team to wear a mask while they are taking care of you, please let them know. You may have one support person who is at least 81 years old accompany you for your appointments.  Paclitaxel Nanoparticle Albumin-Bound Injection What is this medication? NANOPARTICLE ALBUMIN-BOUND PACLITAXEL (Na no PAHR ti kuhl al BYOO muhn-bound PAK li TAX el) treats some types of cancer. It works by slowing down the growth of cancer cells. This medicine may be used for other purposes; ask your health care provider or pharmacist if you have questions. COMMON BRAND NAME(S):  Abraxane What should I tell my care team before I take this medication? They need to know if you have any of these conditions: Liver disease Low white blood cell levels An unusual or allergic reaction to paclitaxel, albumin, other medications, foods, dyes, or preservatives If you or your partner are pregnant or  trying to get pregnant Breast-feeding How should I use this medication? This medication is injected into a vein. It is given by your care team in a hospital or clinic setting. Talk to your care team about the use of this medication in children. Special care may be needed. Overdosage: If you think you have taken too much of this medicine contact a poison control center or emergency room at once. NOTE: This medicine is only for you. Do not share this medicine with others. What if I miss a dose? Keep appointments for follow-up doses. It is important not to miss your dose. Call your care team if you are unable to keep an appointment. What may interact with this medication? Other medications may affect the way this medication works. Talk with your care team about all of the medications you take. They may suggest changes to your treatment plan to lower the risk of side effects and to make sure your medications work as intended. This list may not describe all possible interactions. Give your health care provider a list of all the medicines, herbs, non-prescription drugs, or dietary supplements you use. Also tell them if you smoke, drink alcohol, or use illegal drugs. Some items may interact with your medicine. What should I watch for while using this medication? Your condition will be monitored carefully while you are receiving this medication. You may need blood work while taking this medication. This medication may make you feel generally unwell. This is not uncommon as chemotherapy can affect healthy cells as well as cancer cells. Report any side effects. Continue your course of treatment even though you feel ill unless your care team tells you to stop. This medication can cause serious allergic reactions. To reduce the risk, your care team may give you other medications to take before receiving this one. Be sure to follow the directions from your care team. This medication may increase your risk of  getting an infection. Call your care team for advice if you get a fever, chills, sore throat, or other symptoms of a cold or flu. Do not treat yourself. Try to avoid being around people who are sick. This medication may increase your risk to bruise or bleed. Call your care team if you notice any unusual bleeding. Be careful brushing or flossing your teeth or using a toothpick because you may get an infection or bleed more easily. If you have any dental work done, tell your dentist you are receiving this medication. Talk to your care team if you or your partner may be pregnant. Serious birth defects can occur if you take this medication during pregnancy and for 6 months after the last dose. You will need a negative pregnancy test before starting this medication. Contraception is recommended while taking this medication and for 6 months after the last dose. Your care team can help you find the option that works for you. If your partner can get pregnant, use a condom during sex while taking this medication and for 3 months after the last dose. Do not breastfeed while taking this medication and for 2 weeks after the last dose. This medication may cause infertility. Talk to your care team if  you are concerned about your fertility. What side effects may I notice from receiving this medication? Side effects that you should report to your care team as soon as possible: Allergic reactions--skin rash, itching, hives, swelling of the face, lips, tongue, or throat Dry cough, shortness of breath or trouble breathing Infection--fever, chills, cough, sore throat, wounds that don't heal, pain or trouble when passing urine, general feeling of discomfort or being unwell Low red blood cell level--unusual weakness or fatigue, dizziness, headache, trouble breathing Pain, tingling, or numbness in the hands or feet Stomach pain, unusual weakness or fatigue, nausea, vomiting, diarrhea, or fever that lasts longer than  expected Unusual bruising or bleeding Side effects that usually do not require medical attention (report to your care team if they continue or are bothersome): Diarrhea Fatigue Hair loss Loss of appetite Nausea Vomiting This list may not describe all possible side effects. Call your doctor for medical advice about side effects. You may report side effects to FDA at 1-800-FDA-1088. Where should I keep my medication? This medication is given in a hospital or clinic. It will not be stored at home. NOTE: This sheet is a summary. It may not cover all possible information. If you have questions about this medicine, talk to your doctor, pharmacist, or health care provider.  2023 Elsevier/Gold Standard (2021-12-16 00:00:00)  Gemcitabine Injection What is this medication? GEMCITABINE (jem SYE ta been) treats some types of cancer. It works by slowing down the growth of cancer cells. This medicine may be used for other purposes; ask your health care provider or pharmacist if you have questions. COMMON BRAND NAME(S): Gemzar, Infugem What should I tell my care team before I take this medication? They need to know if you have any of these conditions: Blood disorders Infection Kidney disease Liver disease Lung or breathing disease, such as asthma or COPD Recent or ongoing radiation therapy An unusual or allergic reaction to gemcitabine, other medications, foods, dyes, or preservatives If you or your partner are pregnant or trying to get pregnant Breast-feeding How should I use this medication? This medication is injected into a vein. It is given by your care team in a hospital or clinic setting. Talk to your care team about the use of this medication in children. Special care may be needed. Overdosage: If you think you have taken too much of this medicine contact a poison control center or emergency room at once. NOTE: This medicine is only for you. Do not share this medicine with others. What  if I miss a dose? Keep appointments for follow-up doses. It is important not to miss your dose. Call your care team if you are unable to keep an appointment. What may interact with this medication? Interactions have not been studied. This list may not describe all possible interactions. Give your health care provider a list of all the medicines, herbs, non-prescription drugs, or dietary supplements you use. Also tell them if you smoke, drink alcohol, or use illegal drugs. Some items may interact with your medicine. What should I watch for while using this medication? Your condition will be monitored carefully while you are receiving this medication. This medication may make you feel generally unwell. This is not uncommon, as chemotherapy can affect healthy cells as well as cancer cells. Report any side effects. Continue your course of treatment even though you feel ill unless your care team tells you to stop. In some cases, you may be given additional medications to help with side effects. Follow all  directions for their use. This medication may increase your risk of getting an infection. Call your care team for advice if you get a fever, chills, sore throat, or other symptoms of a cold or flu. Do not treat yourself. Try to avoid being around people who are sick. This medication may increase your risk to bruise or bleed. Call your care team if you notice any unusual bleeding. Be careful brushing or flossing your teeth or using a toothpick because you may get an infection or bleed more easily. If you have any dental work done, tell your dentist you are receiving this medication. Avoid taking medications that contain aspirin, acetaminophen, ibuprofen, naproxen, or ketoprofen unless instructed by your care team. These medications may hide a fever. Talk to your care team if you or your partner wish to become pregnant or think you might be pregnant. This medication can cause serious birth defects if taken  during pregnancy and for 6 months after the last dose. A negative pregnancy test is required before starting this medication. A reliable form of contraception is recommended while taking this medication and for 6 months after the last dose. Talk to your care team about effective forms of contraception. Do not father a child while taking this medication and for 3 months after the last dose. Use a condom while having sex during this time period. Do not breastfeed while taking this medication and for at least 1 week after the last dose. This medication may cause infertility. Talk to your care team if you are concerned about your fertility. What side effects may I notice from receiving this medication? Side effects that you should report to your care team as soon as possible: Allergic reactions--skin rash, itching, hives, swelling of the face, lips, tongue, or throat Capillary leak syndrome--stomach or muscle pain, unusual weakness or fatigue, feeling faint or lightheaded, decrease in the amount of urine, swelling of the ankles, hands, or feet, trouble breathing Infection--fever, chills, cough, sore throat, wounds that don't heal, pain or trouble when passing urine, general feeling of discomfort or being unwell Liver injury--right upper belly pain, loss of appetite, nausea, light-colored stool, dark yellow or brown urine, yellowing skin or eyes, unusual weakness or fatigue Low red blood cell level--unusual weakness or fatigue, dizziness, headache, trouble breathing Lung injury--shortness of breath or trouble breathing, cough, spitting up blood, chest pain, fever Stomach pain, bloody diarrhea, pale skin, unusual weakness or fatigue, decrease in the amount of urine, which may be signs of hemolytic uremic syndrome Sudden and severe headache, confusion, change in vision, seizures, which may be signs of posterior reversible encephalopathy syndrome (PRES) Unusual bruising or bleeding Side effects that usually do  not require medical attention (report to your care team if they continue or are bothersome): Diarrhea Drowsiness Hair loss Nausea Pain, redness, or swelling with sores inside the mouth or throat Vomiting This list may not describe all possible side effects. Call your doctor for medical advice about side effects. You may report side effects to FDA at 1-800-FDA-1088. Where should I keep my medication? This medication is given in a hospital or clinic. It will not be stored at home. NOTE: This sheet is a summary. It may not cover all possible information. If you have questions about this medicine, talk to your doctor, pharmacist, or health care provider.  2023 Elsevier/Gold Standard (2021-12-16 00:00:00)

## 2022-04-05 ENCOUNTER — Encounter: Payer: Self-pay | Admitting: Genetic Counselor

## 2022-04-05 ENCOUNTER — Encounter (INDEPENDENT_AMBULATORY_CARE_PROVIDER_SITE_OTHER): Payer: PPO | Admitting: Ophthalmology

## 2022-04-05 DIAGNOSIS — Z1379 Encounter for other screening for genetic and chromosomal anomalies: Secondary | ICD-10-CM | POA: Insufficient documentation

## 2022-04-09 ENCOUNTER — Encounter (INDEPENDENT_AMBULATORY_CARE_PROVIDER_SITE_OTHER): Payer: PPO | Admitting: Ophthalmology

## 2022-04-09 DIAGNOSIS — H353231 Exudative age-related macular degeneration, bilateral, with active choroidal neovascularization: Secondary | ICD-10-CM

## 2022-04-09 DIAGNOSIS — H43813 Vitreous degeneration, bilateral: Secondary | ICD-10-CM

## 2022-04-12 DIAGNOSIS — C259 Malignant neoplasm of pancreas, unspecified: Secondary | ICD-10-CM | POA: Diagnosis not present

## 2022-04-13 ENCOUNTER — Other Ambulatory Visit: Payer: Self-pay | Admitting: Genetic Counselor

## 2022-04-13 DIAGNOSIS — C250 Malignant neoplasm of head of pancreas: Secondary | ICD-10-CM

## 2022-04-14 ENCOUNTER — Inpatient Hospital Stay (HOSPITAL_BASED_OUTPATIENT_CLINIC_OR_DEPARTMENT_OTHER): Payer: PPO | Admitting: Genetic Counselor

## 2022-04-14 ENCOUNTER — Encounter: Payer: Self-pay | Admitting: Genetic Counselor

## 2022-04-14 DIAGNOSIS — D0512 Intraductal carcinoma in situ of left breast: Secondary | ICD-10-CM

## 2022-04-14 DIAGNOSIS — Z803 Family history of malignant neoplasm of breast: Secondary | ICD-10-CM

## 2022-04-14 DIAGNOSIS — Z1379 Encounter for other screening for genetic and chromosomal anomalies: Secondary | ICD-10-CM

## 2022-04-14 NOTE — Progress Notes (Signed)
REFERRING PROVIDER: Truitt Merle, MD [place],  [place] [address]  PRIMARY PROVIDER:  Deland Pretty, MD  PRIMARY REASON FOR VISIT:  1. Family history of breast cancer   2. Ductal carcinoma in situ (DCIS) of left breast   3. Genetic testing      HISTORY OF PRESENT ILLNESS:   I connected with  [name] on 04/14/2022 at 2:00 PM EDT by MyChart video conference and verified that I am speaking with the correct person using two identifiers.   Patient location: Home Provider location: [facility]   Ms. Date, a 81 y.o. female, was seen for a [place] cancer genetics consultation at the request of Dr. Burr Medico due to a personal and family history of cancer.  [name] presents to clinic today to discuss the possibility of a hereditary predisposition to cancer, genetic testing, and to further clarify her future cancer risks, as well as potential cancer risks for family members.   In 2022, at the age of 25, [name] was diagnosed with DCIS of the left breast. The treatment plan included lumpectomy and radiation.  In 2023, at the age of 74, [name] was diagnosed with pancreatic cancer.  She is currently undergoing chemotherapy.    CANCER HISTORY:  Oncology History  Ductal carcinoma in situ (DCIS) of left breast  02/17/2021 Initial Diagnosis   Screening mammogram detected left breast calcifications lower inner quadrant 0.9 cm: Biopsy revealed high-grade DCIS with necrosis and calcifications, ER 30%, PR 50% weak   02/25/2021 Cancer Staging   Staging form: Breast, AJCC 8th Edition - Clinical stage from 02/25/2021: Stage 0 (cTis (DCIS), cN0, cM0, G3, ER+, PR+, HER2: Not Assessed) - Signed by Nicholas Lose, MD on 02/25/2021 Stage prefix: Initial diagnosis Histologic grading system: 3 grade system   04/16/2021 Cancer Staging   Staging form: Breast, AJCC 8th Edition - Pathologic stage from 04/16/2021: Stage 0 (pTis (DCIS), pN0, cM0, ER+, PR+) - Signed by Gardenia Phlegm, NP on 09/04/2021 Stage prefix: Initial diagnosis Nuclear grade: G3   04/16/2021 Surgery   Left breast lumpectomy with radioactive seed localization   05/21/2021 - 06/18/2021 Radiation Therapy    Radiation Treatment Dates: 05/21/2021 through 06/18/2021 Site Technique Total Dose (Gy) Dose per Fx (Gy) Completed Fx Beam Energies  Breast, Left: Breast_Lt 3D 40.05/40.05 2.67 15/15 6X, 10X  Breast, Left: Breast_Lt_Bst 3D 10/10 2 5/5 6X    Pancreatic cancer ([place])  03/04/2022 Imaging   CT ABDOMEN W CONTRAST   IMPRESSION: 1. Findings highly suspicious for adenocarcinoma in the pancreatic head with resultant upstream atrophy and duct dilatation. Venous encasement as detailed above. No arterial involvement. 2. Suspect cirrhosis and mild steatosis. 3. Coronary artery atherosclerosis. Aortic Atherosclerosis (ICD10-I70.0).   03/09/2022 Tumor Marker   Patient's tumor was tested for the following markers: CA-19.9. Results of the tumor marker test revealed 1715.   03/18/2022 Procedure   EUS-Dr. Ardis Hughs  1. Irregularly shaped, indistinctly bordered, heterogeneous, hypoechoic mass that measures 3.4 cm maximally in the uncinate/head of pancreas. The mass clearly surrounds and attenuates the portal vein near the splenic vein, SMV confluence. The mass is causing mild biliary duct obstruction with the common bile duct measuring 8.6 mm. The mass is also obstructing and dilating the main pancreatic duct which measures up to 9 millimeters in the body and tail. I used a 25-gauge EUS FNB needle to sample the lesion with a transduodenal approach, 2 passes. 2. No peripancreatic adenopathy 3. Limited views of the liver,  spleen were normal   03/18/2022 Pathology Results   CYTOLOGY - NON PAP  CASE: [id number]  PATIENT: Jessica [place]  Non-Gynecological Cytology Report   Clinical History: Cirrhosis, pancreatic mass  Specimen Submitted:  A. PANCREAS, HEAD, FINE NEEDLE ASPIRATION:    FINAL  MICROSCOPIC DIAGNOSIS:  - Malignant cells consistent with adenocarcinoma   SPECIMEN ADEQUACY:  Satisfactory for evaluation   IMMEDIATE EVALUATION:  SUFFICIENT, POSITIVE FOR MALIGNANCY CONSISTENT WITH ADENO CA. (MPL)    03/18/2022 Cancer Staging   Staging form: Exocrine Pancreas, AJCC 8th Edition - Clinical stage from 03/18/2022: Stage IV (cT2, cN1, cM1) - Signed by Truitt Merle, MD on 04/02/2022   03/23/2022 Initial Diagnosis   Pancreatic cancer ([place])   04/02/2022 -  Chemotherapy   Patient is on Treatment Plan : PANCREATIC Abraxane / Gemcitabine D1,15 q28d      Genetic Testing   [facility] CancerNext-Expanded Panel was Negative. Report date is 04/03/2022.  The CancerNext-Expanded gene panel offered by [facility] and includes sequencing, rearrangement, and RNA analysis for the following 77 genes: AIP, ALK, APC, ATM, AXIN2, BAP1, BARD1, BLM, BMPR1A, BRCA1, BRCA2, BRIP1, CDC73, CDH1, CDK4, CDKN1B, CDKN2A, CHEK2, CTNNA1, DICER1, FANCC, FH, FLCN, GALNT12, KIF1B, LZTR1, MAX, MEN1, MET, MLH1, MSH2, MSH3, MSH6, MUTYH, NBN, NF1, NF2, NTHL1, PALB2, PHOX2B, PMS2, POT1, PRKAR1A, PTCH1, PTEN, RAD51C, RAD51D, RB1, RECQL, RET, SDHA, SDHAF2, SDHB, SDHC, SDHD, SMAD4, SMARCA4, SMARCB1, SMARCE1, STK11, SUFU, TMEM127, TP53, TSC1, TSC2, VHL and XRCC2 (sequencing and deletion/duplication); EGFR, EGLN1, HOXB13, KIT, MITF, PDGFRA, POLD1, and POLE (sequencing only); EPCAM and GREM1 (deletion/duplication only).       RISK FACTORS:  Menarche was at age 84-14.  First live birth at age 51.  Ovaries intact: yes.  Hysterectomy: no.  Menopausal status: postmenopausal.  HRT use: 0 years. Colonoscopy: yes;  9 polyps . Mammogram within the last year: yes. Number of breast biopsies: 1. Up to date with pelvic exams: n/a. Any excessive radiation exposure in the past: yes  Past Medical History:  Diagnosis Date   breast cancer 2022   Left   CAD (coronary artery disease)    Cataract 2018   had sx   Depression     Family history of breast cancer    Heart disease '   History of radiation therapy    Left breast 05/21/21-06/18/21- Dr. Gery Pray   HLD (hyperlipidemia)    Obesity, unspecified    Other abnormal glucose    Other B-complex deficiencies    Other seborrheic keratosis    Pancreatic cancer ([place]) 02/2022   Postmenopausal     Past Surgical History:  Procedure Laterality Date   BREAST LUMPECTOMY WITH RADIOACTIVE SEED LOCALIZATION Left 04/16/2021   Procedure: LEFT BREAST LUMPECTOMY WITH RADIOACTIVE SEED LOCALIZATION;  Surgeon: Jovita Kussmaul, MD;  Location: [facility];  Service: General;  Laterality: Left;   CARDIAC CATHETERIZATION  5/03   mild to mod single vessel disease   CHOLECYSTECTOMY     COLONOSCOPY  2018   HPP/TA   ESOPHAGOGASTRODUODENOSCOPY (EGD) WITH PROPOFOL N/A 03/18/2022   Procedure: ESOPHAGOGASTRODUODENOSCOPY (EGD) WITH PROPOFOL;  Surgeon: [name], MD;  Location: [facility];  Service: Gastroenterology;  Laterality: N/A;   EUS N/A 03/18/2022   Procedure: ESOPHAGEAL ENDOSCOPIC ULTRASOUND (EUS) RADIAL;  Surgeon: [name], MD;  Location: [facility];  Service: Gastroenterology;  Laterality: N/A;   FINE NEEDLE ASPIRATION N/A 03/18/2022   Procedure: FINE NEEDLE ASPIRATION (FNA) LINEAR;  Surgeon: [name], MD;  Location: [facility];  Service: Gastroenterology;  Laterality: N/A;   IR IMAGING GUIDED PORT INSERTION  03/31/2022   LAPAROSCOPIC GASTRIC BANDING  01/13/2010   NECK SURGERY     POLYPECTOMY  2018   TONSILLECTOMY     TUBAL LIGATION Bilateral     Social History   Socioeconomic History   Marital status: Widowed    Spouse name: Not on file   Number of children: 3   Years of education: Not on file   Highest education level: Not on file  Occupational History   Occupation: retired    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  Tobacco Use   Smoking status: Never   Smokeless tobacco: Never  Vaping Use   Vaping Use: Never used  Substance and Sexual  Activity   Alcohol use: Yes    Comment: rarely    Drug use: No   Sexual activity: Never  Other Topics Concern   Not on file  Social History Narrative   Married      3 children; 1 son-suicide      Retired      Geologist, 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   Social Determinants of Radio broadcast assistant Strain: Low Risk  (09/18/2021)   Overall Financial Resource Strain (CARDIA)    Difficulty of Paying Living Expenses: Not hard at all  Food Insecurity: No Food Insecurity (09/18/2021)   Hunger Vital Sign    Worried About Running Out of Food in the Last Year: Never true    [place] in the Last Year: Never true  Transportation Needs: No Transportation Needs (09/18/2021)   PRAPARE - Hydrologist (Medical): No    Lack of Transportation (Non-Medical): No  Physical Activity: Inactive (09/18/2021)   Exercise Vital Sign    Days of Exercise per Week: 0 days    Minutes of Exercise per Session: 0 min  Stress: No Stress Concern Present (09/18/2021)   [place]    Feeling of Stress : Not at all  Social Connections: Moderately Isolated (09/18/2021)   Social Connection and Isolation Panel [NHANES]    Frequency of Communication with Friends and Family: More than three times a week    Frequency of Social Gatherings with Friends and Family: Three times a week    Attends Religious Services: 1 to 4 times per year    Active Member of Clubs or Organizations: No    Attends Archivist Meetings: Never    Marital Status: Widowed     FAMILY HISTORY:  We obtained a detailed, 4-generation family history.  Significant diagnoses are listed below: Family History  Problem Relation Age of Onset   Breast cancer Mother 28   Thyroid disease Mother 27   Hypertension Mother    Hyperlipidemia Mother    Lung cancer Mother 10   Kidney disease Mother 24   Heart  attack Father 13   Heart disease Father    Aneurysm Father 69   Diabetes Brother    Diabetes Paternal Grandmother    Breast cancer Cousin        two maternal first cousin   Colon polyps Neg Hx    Colon cancer Neg Hx    Esophageal cancer Neg Hx    Rectal cancer Neg Hx    Stomach cancer Neg Hx       The patient has two sons and  a daughter.  One son died by suicide.  She has a brother who is cancer free.  Both parents are deceased.  The patient's mother had breast cancer at 83.  She had 10 siblings who were cancer free.  Two sister had a daughter each who had breast cancer in their 62's.  The maternal grandparents are deceased from non-cancer related issues.  The patient's father died at 72 from a cerebral aneurysm.  He had several siblings who did not have cancer.  The paternal grandparents are deceased.  Ms. Fernandez is unaware of previous family history of genetic testing for hereditary cancer risks. Patient's maternal ancestors are of Caucasian descent, and paternal ancestors are of Caucasian descent. There is no reported Ashkenazi Jewish ancestry. There is no known consanguinity.  GENETIC COUNSELING ASSESSMENT: Ms. Doebler is a 81 y.o. female with a personal and family history of cancer which is somewhat suggestive of a hereditary cancer syndrome and predisposition to cancer given combination of cancer and number of people in the family with breast cancer. We, therefore, discussed and recommended the following at today's visit.   DISCUSSION: We discussed that, in general, most cancer is not inherited in families, but instead is sporadic or familial. Sporadic cancers occur by chance and typically happen at older ages (>50 years) as this type of cancer is caused by genetic changes acquired during an individual's lifetime. Some families have more cancers than would be expected by chance; however, the ages or types of cancer are not consistent with a known genetic mutation or known genetic  mutations have been ruled out. This type of familial cancer is thought to be due to a combination of multiple genetic, environmental, hormonal, and lifestyle factors. While this combination of factors likely increases the risk of cancer, the exact source of this risk is not currently identifiable or testable.  We discussed that 5 - 10% of breast cancer is hereditary, with most cases associated with BRCA mutations.  There are other genes that can be associated with hereditary breast cancer syndromes.  These include ATM, CHEK2 and PALB2.  We discussed that testing is beneficial for several reasons including knowing how to follow individuals after completing their treatment, identifying whether potential treatment options such as PARP inhibitors would be beneficial, and understand if other family members could be at risk for cancer and allow them to undergo genetic testing.   Genetic testing was performed at Ms. Ybarra's first appointment for her pancreatic cancer.  We reviewed her testing with her today.    GENETIC TEST RESULTS: Genetic testing reported out on April 03, 2022 through the CancerNext+RNAinsight cancer panel found no pathogenic mutations. The CancerNext gene panel offered by [facility] includes sequencing and rearrangement analysis for the following 34 genes:   APC, ATM, BARD1, BMPR1A, BRCA1, BRCA2, BRIP1, CDH1, CDK4, CDKN2A, CHEK2, DICER1, HOXB13, EPCAM, GREM1, MLH1, MRE11A, MSH2, MSH6, MUTYH, NBN, NF1, PALB2, PMS2, POLD1, POLE, PTEN, RAD50, RAD51C, RAD51D, SMAD4, SMARCA4, STK11, and TP53.. The test report has been scanned into EPIC and is located under the Molecular Pathology section of the Results Review tab.  A portion of the result report is included below for reference.      We discussed with Ms. Newland that because current genetic testing is not perfect, it is possible there may be a gene mutation in one of these genes that current testing cannot detect, but that chance is small.  We  also discussed, that there could be another gene that has not yet been  discovered, or that we have not yet tested, that is responsible for the cancer diagnoses in the family. It is also possible there is a hereditary cause for the cancer in the family that Ms. Metzger did not inherit and therefore was not identified in her testing.  Therefore, it is important to remain in touch with cancer genetics in the future so that we can continue to offer Ms. Halberg the most up to date genetic testing.   ADDITIONAL GENETIC TESTING: We discussed with Ms. Crouse that her genetic testing was fairly extensive.  If there are genes identified to increase cancer risk that can be analyzed in the future, we would be happy to discuss and coordinate this testing at that time.    CANCER SCREENING RECOMMENDATIONS: Ms. Dangerfield test result is considered negative (normal).  This means that we have not identified a hereditary cause for her personal and family history of cancer at this time. Most cancers happen by chance and this negative test suggests that her cancer may fall into this category.    While reassuring, this does not definitively rule out a hereditary predisposition to cancer. It is still possible that there could be genetic mutations that are undetectable by current technology. There could be genetic mutations in genes that have not been tested or identified to increase cancer risk.  Therefore, it is recommended she continue to follow the cancer management and screening guidelines provided by her oncology and primary healthcare provider.   An individual's cancer risk and medical management are not determined by genetic test results alone. Overall cancer risk assessment incorporates additional factors, including personal medical history, family history, and any available genetic information that may result in a personalized plan for cancer prevention and surveillance  RECOMMENDATIONS FOR FAMILY MEMBERS:  Individuals in  this family might be at some increased risk of developing cancer, over the general population risk, simply due to the family history of cancer.  We recommended women in this family have a yearly mammogram beginning at age 41, or 74 years younger than the earliest onset of cancer, an annual clinical breast exam, and perform monthly breast self-exams. Women in this family should also have a gynecological exam as recommended by their primary provider. All family members should be referred for colonoscopy starting at age 57.  FOLLOW-UP: Lastly, we discussed with Ms. Ewald that cancer genetics is a rapidly advancing field and it is possible that new genetic tests will be appropriate for her and/or her family members in the future. We encouraged her to remain in contact with cancer genetics on an annual basis so we can update her personal and family histories and let her know of advances in cancer genetics that may benefit this family.   Our contact number was provided. Ms. Mclaine questions were answered to her satisfaction, and she knows she is welcome to call us at anytime with additional questions or concerns.   Roma Kayser, [place], [facility] Licensed, Certified Genetic Counselor [name]   The patient was seen for a total of 25 minutes in face-to-face genetic counseling.  The patient was seen alone.  This patient was discussed with Drs. Ophelia Shoulder and/or [place] who agrees with the above.    _______________________________________________________________________ For Office Staff:  Number of people involved in session: 1 Was an Intern/ student involved with case: no

## 2022-04-16 ENCOUNTER — Other Ambulatory Visit: Payer: Self-pay

## 2022-04-16 ENCOUNTER — Inpatient Hospital Stay: Payer: PPO

## 2022-04-16 ENCOUNTER — Inpatient Hospital Stay: Payer: PPO | Admitting: Hematology

## 2022-04-16 ENCOUNTER — Encounter: Payer: Self-pay | Admitting: Hematology

## 2022-04-16 VITALS — BP 128/76 | HR 87 | Temp 98.0°F | Resp 17 | Wt 191.2 lb

## 2022-04-16 DIAGNOSIS — C250 Malignant neoplasm of head of pancreas: Secondary | ICD-10-CM

## 2022-04-16 DIAGNOSIS — Z5111 Encounter for antineoplastic chemotherapy: Secondary | ICD-10-CM | POA: Diagnosis not present

## 2022-04-16 LAB — CBC WITH DIFFERENTIAL (CANCER CENTER ONLY)
Abs Immature Granulocytes: 0.01 10*3/uL (ref 0.00–0.07)
Basophils Absolute: 0 10*3/uL (ref 0.0–0.1)
Basophils Relative: 1 %
Eosinophils Absolute: 0.1 10*3/uL (ref 0.0–0.5)
Eosinophils Relative: 2 %
HCT: 40.2 % (ref 36.0–46.0)
Hemoglobin: 13.8 g/dL (ref 12.0–15.0)
Immature Granulocytes: 0 %
Lymphocytes Relative: 29 %
Lymphs Abs: 0.9 10*3/uL (ref 0.7–4.0)
MCH: 31.4 pg (ref 26.0–34.0)
MCHC: 34.3 g/dL (ref 30.0–36.0)
MCV: 91.4 fL (ref 80.0–100.0)
Monocytes Absolute: 0.6 10*3/uL (ref 0.1–1.0)
Monocytes Relative: 19 %
Neutro Abs: 1.5 10*3/uL — ABNORMAL LOW (ref 1.7–7.7)
Neutrophils Relative %: 49 %
Platelet Count: 274 10*3/uL (ref 150–400)
RBC: 4.4 MIL/uL (ref 3.87–5.11)
RDW: 13.4 % (ref 11.5–15.5)
WBC Count: 3 10*3/uL — ABNORMAL LOW (ref 4.0–10.5)
nRBC: 0 % (ref 0.0–0.2)

## 2022-04-16 LAB — CMP (CANCER CENTER ONLY)
ALT: 52 U/L — ABNORMAL HIGH (ref 0–44)
AST: 52 U/L — ABNORMAL HIGH (ref 15–41)
Albumin: 4 g/dL (ref 3.5–5.0)
Alkaline Phosphatase: 85 U/L (ref 38–126)
Anion gap: 6 (ref 5–15)
BUN: 17 mg/dL (ref 8–23)
CO2: 28 mmol/L (ref 22–32)
Calcium: 9.5 mg/dL (ref 8.9–10.3)
Chloride: 103 mmol/L (ref 98–111)
Creatinine: 0.61 mg/dL (ref 0.44–1.00)
GFR, Estimated: >60 mL/min (ref >60)
Glucose, Bld: 160 mg/dL — ABNORMAL HIGH (ref 70–99)
Potassium: 4.1 mmol/L (ref 3.5–5.1)
Sodium: 137 mmol/L (ref 135–145)
Total Bilirubin: 0.6 mg/dL (ref 0.3–1.2)
Total Protein: 6.7 g/dL (ref 6.5–8.1)

## 2022-04-16 MED ORDER — SODIUM CHLORIDE 0.9% FLUSH
10.0000 mL | INTRAVENOUS | Status: DC | PRN
  Administered 2022-04-16: 10 mL

## 2022-04-16 MED ORDER — HEPARIN SOD (PORK) LOCK FLUSH 100 UNIT/ML IV SOLN
500.0000 [IU] | Freq: Once | INTRAVENOUS | Status: AC | PRN
  Administered 2022-04-16: 500 [IU]

## 2022-04-16 MED ORDER — SODIUM CHLORIDE 0.9 % IV SOLN
1000.0000 mg/m2 | Freq: Once | INTRAVENOUS | Status: AC
  Administered 2022-04-16: 2014 mg via INTRAVENOUS
  Filled 2022-04-16: qty 52.6

## 2022-04-16 MED ORDER — SODIUM CHLORIDE 0.9 % IV SOLN: Freq: Once | INTRAVENOUS | Status: AC

## 2022-04-16 MED ORDER — PROCHLORPERAZINE MALEATE 10 MG PO TABS
10.0000 mg | ORAL_TABLET | Freq: Once | ORAL | Status: AC
  Administered 2022-04-16: 10 mg via ORAL
  Filled 2022-04-16: qty 1

## 2022-04-16 MED ORDER — SODIUM CHLORIDE 0.9% FLUSH
10.0000 mL | Freq: Once | INTRAVENOUS | Status: AC
  Administered 2022-04-16: 10 mL via INTRAVENOUS

## 2022-04-16 MED ORDER — PACLITAXEL PROTEIN-BOUND CHEMO INJECTION 100 MG
125.0000 mg/m2 | Freq: Once | INTRAVENOUS | Status: AC
  Administered 2022-04-16: 250 mg via INTRAVENOUS
  Filled 2022-04-16: qty 50

## 2022-04-16 NOTE — Patient Instructions (Signed)
[facility] MEDICAL ONCOLOGY  Discharge Instructions: Thank you for choosing [facility] to provide your oncology and hematology care.   If you have a lab appointment with the Cancer Center, please go directly to the Cancer Center and check in at the registration area.   Wear comfortable clothing and clothing appropriate for easy access to any Portacath or PICC line.   We strive to give you quality time with your provider. You may need to reschedule your appointment if you arrive late (15 or more minutes).  Arriving late affects you and other patients whose appointments are after yours.  Also, if you miss three or more appointments without notifying the office, you may be dismissed from the clinic at the provider's discretion.      For prescription refill requests, have your pharmacy contact our office and allow 72 hours for refills to be completed.    Today you received the following chemotherapy and/or immunotherapy agents: paclitaxel-protein bound and gemcitabine      To help prevent nausea and vomiting after your treatment, we encourage you to take your nausea medication as directed.  BELOW ARE SYMPTOMS THAT SHOULD BE REPORTED IMMEDIATELY: *FEVER GREATER THAN 100.4 F (38 C) OR HIGHER *CHILLS OR SWEATING *NAUSEA AND VOMITING THAT IS NOT CONTROLLED WITH YOUR NAUSEA MEDICATION *UNUSUAL SHORTNESS OF BREATH *UNUSUAL BRUISING OR BLEEDING *URINARY PROBLEMS (pain or burning when urinating, or frequent urination) *BOWEL PROBLEMS (unusual diarrhea, constipation, pain near the anus) TENDERNESS IN MOUTH AND THROAT WITH OR WITHOUT PRESENCE OF ULCERS (sore throat, sores in mouth, or a toothache) UNUSUAL RASH, SWELLING OR PAIN  UNUSUAL VAGINAL DISCHARGE OR ITCHING   Items with * indicate a potential emergency and should be followed up as soon as possible or go to the Emergency Department if any problems should occur.  Please show the CHEMOTHERAPY ALERT CARD or  IMMUNOTHERAPY ALERT CARD at check-in to the Emergency Department and triage nurse.  Should you have questions after your visit or need to cancel or reschedule your appointment, please contact [facility] MEDICAL ONCOLOGY  Dept: [phone]  and follow the prompts.  Office hours are 8:00 a.m. to 4:30 p.m. Monday - Friday. Please note that voicemails left after 4:00 p.m. may not be returned until the following business day.  We are closed weekends and major holidays. You have access to a nurse at all times for urgent questions. Please call the main number to the clinic Dept: [phone] and follow the prompts.   For any non-urgent questions, you may also contact your provider using MyChart. We now offer e-Visits for anyone 18 and older to request care online for non-urgent symptoms. For details visit [email or web address].   Also download the MyChart app! Go to the app store, search "MyChart", open the app, select [place], and log in with your MyChart username and password.  Masks are optional in the cancer centers. If you would like for your care team to wear a mask while they are taking care of you, please let them know. You may have one support person who is at least 81 years old accompany you for your appointments. 

## 2022-04-16 NOTE — Progress Notes (Signed)
[place]   [phone] [phone]   Clinic Follow up Note   Patient Care Team: Deland Pretty, MD as PCP - General (Internal Medicine) Mauro Kaufmann, RN as Oncology Nurse Navigator Rockwell Germany, RN as Oncology Nurse Navigator Jovita Kussmaul, MD as Consulting Physician (General Surgery) Nicholas Lose, MD as Consulting Physician (Hematology and Oncology) Gery Pray, MD as Consulting Physician (Radiation Oncology) Delice Bison, Charlestine Massed, NP as Nurse Practitioner (Hematology and Oncology) Harmon Pier, RN as Registered Nurse Truitt Merle, MD as Consulting Physician (Oncology) Royston Bake, RN as Oncology Nurse Navigator (Oncology)  Date of Service:  04/16/2022  CHIEF COMPLAINT: f/u of pancreatic cancer  CURRENT THERAPY:  First line chemo Abraxane/Gemcitabine, q14d, starting 04/02/22  ASSESSMENT & PLAN:  Jessica Livingston is a 81 y.o. female with   1. Pancreatic adenocarcinoma, cT2N1Mx, with indeterminate lung nodules -presented with abdominal discomfort, back pain, and nausea/vomiting. Abdomen CT scan on 03/04/22 showed a pancreatic head mass with venous encasement.   -baseline CA 19.9 1,715, AFP 9.2 on 03/09/22 -EUS/FNA on 03/18/22 showed 3.4 cm mass in uncinate/head of the pancreas, surrounding portal vein, and causing mild biliary obstruction. Cytology confirmed adenocarcinoma. -staging chest CT on 03/30/22 showed four subcentimeter pulmonary nodules in both lungs, with largest 9 mm, no thoracic adenopathy. -she met Dr. Zenia Resides on 04/12/22, plan for reassessment after chemo. -she began abraxane/gemcitabine on 8/4. She tolerated very well with only mild fatigue. -labs reviewed, ANC 1.5, as expected from treatment. Adequate for second treatment today, will go up to full dose. -plan to repeat scan after cycle 6   2. H/o Left breast DCIS, ER+/PR+, grade 3 -diagnosed in 01/2021. S/p lumpectomy 04/16/21, path showed DCIS. Treated with adjuvant radiation  05/22/21 - 06/18/21. She declined antiestrogen therapy.   3. Genetics -she reports breast cancer in her mother at age 64. -blood test obtained 03/23/22, she met genetic counselor Roma Kayser on 04/14/22. -The patient has 2 living children   4. Abdominal and Back pain -Currently using MS Contin and Norco   5. Social -[place]  [place] are 4 houses on the property and family lives in the other homes on the property. -The patient lives in 36 of these houses alone.  She is independent with her activities of daily living and is active mowing her lawn as well as attending church   6. Comorbidities (hepatic cirrhosis, hx Lap-Band surgery): -Hepatic steatosis noted incidentally on staging CT scan. 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  No evidence of decompensation/complications from this at this time -History of lap band surgery     PLAN: -proceed with second gem/abraxane today as scheduled, at full dose -lab, flush, f/u, and gem/abraxane 9/1   No problem-specific Assessment & Plan notes found for this encounter.   SUMMARY OF ONCOLOGIC HISTORY: Oncology History  Ductal carcinoma in situ (DCIS) of left breast  02/17/2021 Initial Diagnosis   Screening mammogram detected left breast calcifications lower inner quadrant 0.9 cm: Biopsy revealed high-grade DCIS with necrosis and calcifications, ER 30%, PR 50% weak   02/25/2021 Cancer Staging   Staging form: Breast, AJCC 8th Edition - Clinical stage from 02/25/2021: Stage 0 (cTis (DCIS), cN0, cM0, G3, ER+, PR+, HER2: Not Assessed) - Signed by Nicholas Lose, MD on 02/25/2021 Stage prefix: Initial diagnosis Histologic grading system: 3 grade system   04/16/2021 Cancer Staging   Staging form: Breast, AJCC  8th Edition - Pathologic stage from 04/16/2021: Stage 0 (pTis (DCIS), pN0, cM0, ER+, PR+) - Signed by Gardenia Phlegm, NP on  09/04/2021 Stage prefix: Initial diagnosis Nuclear grade: G3   04/16/2021 Surgery   Left breast lumpectomy with radioactive seed localization   05/21/2021 - 06/18/2021 Radiation Therapy    Radiation Treatment Dates: 05/21/2021 through 06/18/2021 Site Technique Total Dose (Gy) Dose per Fx (Gy) Completed Fx Beam Energies  Breast, Left: Breast_Lt 3D 40.05/40.05 2.67 15/15 6X, 10X  Breast, Left: Breast_Lt_Bst 3D 10/10 2 5/5 6X    Pancreatic cancer ([place])  03/04/2022 Imaging   CT ABDOMEN W CONTRAST   IMPRESSION: 1. Findings highly suspicious for adenocarcinoma in the pancreatic head with resultant upstream atrophy and duct dilatation. Venous encasement as detailed above. No arterial involvement. 2. Suspect cirrhosis and mild steatosis. 3. Coronary artery atherosclerosis. Aortic Atherosclerosis (ICD10-I70.0).   03/09/2022 Tumor Marker   Patient's tumor was tested for the following markers: CA-19.9. Results of the tumor marker test revealed 1715.   03/18/2022 Procedure   EUS-Dr. Ardis Hughs  1. Irregularly shaped, indistinctly bordered, heterogeneous, hypoechoic mass that measures 3.4 cm maximally in the uncinate/head of pancreas. The mass clearly surrounds and attenuates the portal vein near the splenic vein, SMV confluence. The mass is causing mild biliary duct obstruction with the common bile duct measuring 8.6 mm. The mass is also obstructing and dilating the main pancreatic duct which measures up to 9 millimeters in the body and tail. I used a 25-gauge EUS FNB needle to sample the lesion with a transduodenal approach, 2 passes. 2. No peripancreatic adenopathy 3. Limited views of the liver, spleen were normal   03/18/2022 Pathology Results   CYTOLOGY - NON PAP  CASE: [id number]  PATIENT: Jessica Livingston  Non-Gynecological Cytology Report   Clinical History: Cirrhosis, pancreatic mass  Specimen Submitted:  A. PANCREAS, HEAD, FINE NEEDLE ASPIRATION:    FINAL MICROSCOPIC DIAGNOSIS:  -  Malignant cells consistent with adenocarcinoma   SPECIMEN ADEQUACY:  Satisfactory for evaluation   IMMEDIATE EVALUATION:  SUFFICIENT, POSITIVE FOR MALIGNANCY CONSISTENT WITH ADENO CA. (MPL)    03/18/2022 Cancer Staging   Staging form: Exocrine Pancreas, AJCC 8th Edition - Clinical stage from 03/18/2022: Stage IV (cT2, cN1, cM1) - Signed by Truitt Merle, MD on 04/02/2022   03/23/2022 Initial Diagnosis   Pancreatic cancer ([place])   04/02/2022 -  Chemotherapy   Patient is on Treatment Plan : PANCREATIC Abraxane / Gemcitabine D1,15 q28d      Genetic Testing   [facility] CancerNext-Expanded Panel was Negative. Report date is 04/03/2022.  The CancerNext-Expanded gene panel offered by [facility] and includes sequencing, rearrangement, and RNA analysis for the following 77 genes: AIP, ALK, APC, ATM, AXIN2, BAP1, BARD1, BLM, BMPR1A, BRCA1, BRCA2, BRIP1, CDC73, CDH1, CDK4, CDKN1B, CDKN2A, CHEK2, CTNNA1, DICER1, FANCC, FH, FLCN, GALNT12, KIF1B, LZTR1, MAX, MEN1, MET, MLH1, MSH2, MSH3, MSH6, MUTYH, NBN, NF1, NF2, NTHL1, PALB2, PHOX2B, PMS2, POT1, PRKAR1A, PTCH1, PTEN, RAD51C, RAD51D, RB1, RECQL, RET, SDHA, SDHAF2, SDHB, SDHC, SDHD, SMAD4, SMARCA4, SMARCB1, SMARCE1, STK11, SUFU, TMEM127, TP53, TSC1, TSC2, VHL and XRCC2 (sequencing and deletion/duplication); EGFR, EGLN1, HOXB13, KIT, MITF, PDGFRA, POLD1, and POLE (sequencing only); EPCAM and GREM1 (deletion/duplication only).       INTERVAL HISTORY:  Jessica Livingston is here for a follow up of pancreatic cancer. She was last seen by me on 04/02/22. She presents to the clinic alone. She reports she did very well with first treatment. She reports her only side effect was mild  to moderate fatigue. She notes yesterday was "excellent."   All other systems were reviewed with the patient and are negative.  MEDICAL HISTORY:  Past Medical History:  Diagnosis Date   breast cancer 2022   Left   CAD (coronary artery disease)    Cataract 2018   had sx    Depression    Family history of breast cancer    Heart disease '   History of radiation therapy    Left breast 05/21/21-06/18/21- Dr. Gery Pray   HLD (hyperlipidemia)    Obesity, unspecified    Other abnormal glucose    Other B-complex deficiencies    Other seborrheic keratosis    Pancreatic cancer ([place]) 02/2022   Postmenopausal     SURGICAL HISTORY: Past Surgical History:  Procedure Laterality Date   BREAST LUMPECTOMY WITH RADIOACTIVE SEED LOCALIZATION Left 04/16/2021   Procedure: LEFT BREAST LUMPECTOMY WITH RADIOACTIVE SEED LOCALIZATION;  Surgeon: Jovita Kussmaul, MD;  Location: [place];  Service: General;  Laterality: Left;   CARDIAC CATHETERIZATION  5/03   mild to mod single vessel disease   CHOLECYSTECTOMY     COLONOSCOPY  2018   HPP/TA   ESOPHAGOGASTRODUODENOSCOPY (EGD) WITH PROPOFOL N/A 03/18/2022   Procedure: ESOPHAGOGASTRODUODENOSCOPY (EGD) WITH PROPOFOL;  Surgeon: [name], MD;  Location: [facility];  Service: Gastroenterology;  Laterality: N/A;   EUS N/A 03/18/2022   Procedure: ESOPHAGEAL ENDOSCOPIC ULTRASOUND (EUS) RADIAL;  Surgeon: [name], MD;  Location: [facility];  Service: Gastroenterology;  Laterality: N/A;   FINE NEEDLE ASPIRATION N/A 03/18/2022   Procedure: FINE NEEDLE ASPIRATION (FNA) LINEAR;  Surgeon: [name], MD;  Location: [facility];  Service: Gastroenterology;  Laterality: N/A;   IR IMAGING GUIDED PORT INSERTION  03/31/2022   LAPAROSCOPIC GASTRIC BANDING  01/13/2010   NECK SURGERY     POLYPECTOMY  2018   TONSILLECTOMY     TUBAL LIGATION Bilateral     I have reviewed the social history and family history with the patient and they are unchanged from previous note.  ALLERGIES:  is allergic to atorvastatin and rosuvastatin.  MEDICATIONS:  Current Outpatient Medications  Medication Sig Dispense Refill   Besifloxacin HCl (BESIVANCE) 0.6 % SUSP Place 1 drop into both eyes See admin instructions. Instill 1 drop into both eyes 3  times daily once a month on the day of monthly eye injections     Cyanocobalamin (VITAMIN B-12 PO) Take 2 tablets by mouth daily. gummy     HYDROcodone-acetaminophen (NORCO) 10-325 MG tablet Take 1 tablet by mouth every 6 (six) hours as needed for severe pain or moderate pain. 30 tablet 0   lidocaine-prilocaine (EMLA) cream Apply to affected area once 30 g 3   morphine (MS CONTIN) 15 MG 12 hr tablet Take 1 tablet (15 mg total) by mouth every 12 (twelve) hours. 30 tablet 0   omeprazole (PRILOSEC) 20 MG capsule Take 20 mg by mouth daily.     ondansetron (ZOFRAN) 8 MG tablet Take 1 tablet (8 mg total) by mouth 2 (two) times daily as needed (Nausea or vomiting). 30 tablet 1   ondansetron (ZOFRAN-ODT) 4 MG disintegrating tablet Take 1 tablet (4 mg total) by mouth every 8 (eight) hours as needed for nausea or vomiting. 20 tablet 0   prochlorperazine (COMPAZINE) 10 MG tablet Take 1 tablet (10 mg total) by mouth every 6 (six) hours as needed. 30 tablet 2   VITAMIN D PO Take 2 tablets by mouth daily. gummy  No current facility-administered medications for this visit.   Facility-Administered Medications Ordered in Other Visits  Medication Dose Route Frequency Provider Last Rate Last Admin   sodium chloride flush (NS) 0.9 % injection 10 mL  10 mL Intracatheter PRN Truitt Merle, MD   10 mL at 04/16/22 1319    PHYSICAL EXAMINATION: ECOG PERFORMANCE STATUS: 1 - Symptomatic but completely ambulatory  Vitals:   04/16/22 1016  BP: 128/76  Pulse: 87  Resp: 17  Temp: 98 F (36.7 C)  SpO2: 95%   Wt Readings from Last 3 Encounters:  04/16/22 191 lb 3 oz (86.7 kg)  04/02/22 190 lb 9.6 oz (86.5 kg)  03/31/22 200 lb 13.4 oz (91.1 kg)     GENERAL:alert, no distress and comfortable SKIN: skin color normal, no rashes or significant lesions EYES: normal, Conjunctiva are pink and non-injected, sclera clear  NEURO: alert & oriented x 3 with fluent speech  LABORATORY DATA:  I have reviewed the data as  listed    Latest Ref Rng & Units 04/16/2022    9:54 AM 04/02/2022    8:25 AM 03/11/2022    8:57 AM  CBC  WBC 4.0 - 10.5 K/uL 3.0  6.4  5.2   Hemoglobin 12.0 - 15.0 g/dL 13.8  15.1  14.4   Hematocrit 36.0 - 46.0 % 40.2  44.0  43.5   Platelets 150 - 400 K/uL 274  182  189         Latest Ref Rng & Units 04/16/2022    9:54 AM 04/02/2022    8:25 AM 03/11/2022    8:57 AM  CMP  Glucose 70 - 99 mg/dL 160  182  172   BUN 8 - 23 mg/dL 17  17  11    Creatinine 0.44 - 1.00 mg/dL 0.61  0.69  0.66   Sodium 135 - 145 mmol/L 137  135  141   Potassium 3.5 - 5.1 mmol/L 4.1  3.3  3.5   Chloride 98 - 111 mmol/L 103  99  105   CO2 22 - 32 mmol/L 28  28  28    Calcium 8.9 - 10.3 mg/dL 9.5  8.9  8.9   Total Protein 6.5 - 8.1 g/dL 6.7  7.2  7.0   Total Bilirubin 0.3 - 1.2 mg/dL 0.6  1.2  0.6   Alkaline Phos 38 - 126 U/L 85  101  83   AST 15 - 41 U/L 52  33  26   ALT 0 - 44 U/L 52  26  23       RADIOGRAPHIC STUDIES: I have personally reviewed the radiological images as listed and agreed with the findings in the report. No results found.    No orders of the defined types were placed in this encounter.  All questions were answered. The patient knows to call the clinic with any problems, questions or concerns. No barriers to learning was detected. The total time spent in the appointment was 30 minutes.     Truitt Merle, MD 04/16/2022   I, Wilburn Mylar, am acting as scribe for Truitt Merle, MD.   I have reviewed the above documentation for accuracy and completeness, and I agree with the above.

## 2022-04-26 ENCOUNTER — Other Ambulatory Visit: Payer: Self-pay

## 2022-04-26 ENCOUNTER — Telehealth: Payer: Self-pay

## 2022-04-26 NOTE — Telephone Encounter (Signed)
Pt called with concerns regarding upcoming appt this week for Abraxane/Gemcitabine.  Pt stated her last appt she experienced significant rectal pain which she's extremely hesitant about this weeks infusion.  Pt would like to speak with Dr. Burr Medico or Gloriajean Dell, NP regarding what can be done to prevent the rectal pain she experienced last infusion.  Notified both providers for them to give her a call.

## 2022-04-30 ENCOUNTER — Inpatient Hospital Stay: Payer: PPO | Attending: Hematology | Admitting: Hematology

## 2022-04-30 ENCOUNTER — Inpatient Hospital Stay (HOSPITAL_BASED_OUTPATIENT_CLINIC_OR_DEPARTMENT_OTHER): Payer: PPO

## 2022-04-30 ENCOUNTER — Inpatient Hospital Stay: Payer: PPO | Admitting: Dietician

## 2022-04-30 ENCOUNTER — Other Ambulatory Visit: Payer: Self-pay

## 2022-04-30 ENCOUNTER — Encounter: Payer: Self-pay | Admitting: Hematology

## 2022-04-30 ENCOUNTER — Inpatient Hospital Stay: Payer: PPO

## 2022-04-30 VITALS — BP 134/86 | HR 89 | Resp 17

## 2022-04-30 VITALS — BP 115/81 | HR 64 | Temp 98.5°F | Resp 15 | Wt 187.0 lb

## 2022-04-30 DIAGNOSIS — M549 Dorsalgia, unspecified: Secondary | ICD-10-CM | POA: Diagnosis not present

## 2022-04-30 DIAGNOSIS — K521 Toxic gastroenteritis and colitis: Secondary | ICD-10-CM | POA: Diagnosis not present

## 2022-04-30 DIAGNOSIS — Z95828 Presence of other vascular implants and grafts: Secondary | ICD-10-CM | POA: Insufficient documentation

## 2022-04-30 DIAGNOSIS — Z803 Family history of malignant neoplasm of breast: Secondary | ICD-10-CM | POA: Insufficient documentation

## 2022-04-30 DIAGNOSIS — Z86000 Personal history of in-situ neoplasm of breast: Secondary | ICD-10-CM | POA: Diagnosis not present

## 2022-04-30 DIAGNOSIS — Z5111 Encounter for antineoplastic chemotherapy: Secondary | ICD-10-CM | POA: Diagnosis not present

## 2022-04-30 DIAGNOSIS — K6289 Other specified diseases of anus and rectum: Secondary | ICD-10-CM | POA: Insufficient documentation

## 2022-04-30 DIAGNOSIS — Z923 Personal history of irradiation: Secondary | ICD-10-CM | POA: Insufficient documentation

## 2022-04-30 DIAGNOSIS — D0512 Intraductal carcinoma in situ of left breast: Secondary | ICD-10-CM

## 2022-04-30 DIAGNOSIS — K5900 Constipation, unspecified: Secondary | ICD-10-CM | POA: Diagnosis not present

## 2022-04-30 DIAGNOSIS — Z79899 Other long term (current) drug therapy: Secondary | ICD-10-CM | POA: Insufficient documentation

## 2022-04-30 DIAGNOSIS — Z9884 Bariatric surgery status: Secondary | ICD-10-CM | POA: Diagnosis not present

## 2022-04-30 DIAGNOSIS — C250 Malignant neoplasm of head of pancreas: Secondary | ICD-10-CM

## 2022-04-30 LAB — CBC WITH DIFFERENTIAL (CANCER CENTER ONLY)
Abs Immature Granulocytes: 0.01 10*3/uL (ref 0.00–0.07)
Basophils Absolute: 0 10*3/uL (ref 0.0–0.1)
Basophils Relative: 1 %
Eosinophils Absolute: 0.1 10*3/uL (ref 0.0–0.5)
Eosinophils Relative: 2 %
HCT: 40.2 % (ref 36.0–46.0)
Hemoglobin: 13.7 g/dL (ref 12.0–15.0)
Immature Granulocytes: 0 %
Lymphocytes Relative: 30 %
Lymphs Abs: 1 10*3/uL (ref 0.7–4.0)
MCH: 31.6 pg (ref 26.0–34.0)
MCHC: 34.1 g/dL (ref 30.0–36.0)
MCV: 92.8 fL (ref 80.0–100.0)
Monocytes Absolute: 0.8 10*3/uL (ref 0.1–1.0)
Monocytes Relative: 25 %
Neutro Abs: 1.4 10*3/uL — ABNORMAL LOW (ref 1.7–7.7)
Neutrophils Relative %: 42 %
Platelet Count: 199 10*3/uL (ref 150–400)
RBC: 4.33 MIL/uL (ref 3.87–5.11)
RDW: 14.4 % (ref 11.5–15.5)
WBC Count: 3.2 10*3/uL — ABNORMAL LOW (ref 4.0–10.5)
nRBC: 0 % (ref 0.0–0.2)

## 2022-04-30 LAB — CMP (CANCER CENTER ONLY)
ALT: 45 U/L — ABNORMAL HIGH (ref 0–44)
AST: 37 U/L (ref 15–41)
Albumin: 4 g/dL (ref 3.5–5.0)
Alkaline Phosphatase: 86 U/L (ref 38–126)
Anion gap: 6 (ref 5–15)
BUN: 10 mg/dL (ref 8–23)
CO2: 29 mmol/L (ref 22–32)
Calcium: 9.2 mg/dL (ref 8.9–10.3)
Chloride: 103 mmol/L (ref 98–111)
Creatinine: 0.62 mg/dL (ref 0.44–1.00)
GFR, Estimated: >60 mL/min (ref >60)
Glucose, Bld: 144 mg/dL — ABNORMAL HIGH (ref 70–99)
Potassium: 4 mmol/L (ref 3.5–5.1)
Sodium: 138 mmol/L (ref 135–145)
Total Bilirubin: 0.7 mg/dL (ref 0.3–1.2)
Total Protein: 7 g/dL (ref 6.5–8.1)

## 2022-04-30 MED ORDER — SODIUM CHLORIDE 0.9% FLUSH
10.0000 mL | Freq: Once | INTRAVENOUS | Status: AC
  Administered 2022-04-30: 10 mL

## 2022-04-30 MED ORDER — SODIUM CHLORIDE 0.9 % IV SOLN: Freq: Once | INTRAVENOUS | Status: AC

## 2022-04-30 MED ORDER — PACLITAXEL PROTEIN-BOUND CHEMO INJECTION 100 MG
125.0000 mg/m2 | Freq: Once | INTRAVENOUS | Status: AC
  Administered 2022-04-30: 250 mg via INTRAVENOUS
  Filled 2022-04-30: qty 50

## 2022-04-30 MED ORDER — PROCHLORPERAZINE MALEATE 10 MG PO TABS
10.0000 mg | ORAL_TABLET | Freq: Once | ORAL | Status: AC
  Administered 2022-04-30: 10 mg via ORAL
  Filled 2022-04-30: qty 1

## 2022-04-30 MED ORDER — SODIUM CHLORIDE 0.9 % IV SOLN
1000.0000 mg/m2 | Freq: Once | INTRAVENOUS | Status: AC
  Administered 2022-04-30: 1938 mg via INTRAVENOUS
  Filled 2022-04-30: qty 50.97

## 2022-04-30 MED ORDER — SODIUM CHLORIDE 0.9% FLUSH
10.0000 mL | INTRAVENOUS | Status: DC | PRN
  Administered 2022-04-30: 10 mL

## 2022-04-30 MED ORDER — HEPARIN SOD (PORK) LOCK FLUSH 100 UNIT/ML IV SOLN
500.0000 [IU] | Freq: Once | INTRAVENOUS | Status: AC | PRN
  Administered 2022-04-30: 500 [IU]

## 2022-04-30 NOTE — Progress Notes (Signed)
[place]   [phone] [phone]   Clinic Follow up Note   Patient Care Team: Deland Pretty, MD as PCP - General (Internal Medicine) Mauro Kaufmann, RN as Oncology Nurse Navigator Rockwell Germany, RN as Oncology Nurse Navigator Jovita Kussmaul, MD as Consulting Physician (General Surgery) Nicholas Lose, MD as Consulting Physician (Hematology and Oncology) Gery Pray, MD as Consulting Physician (Radiation Oncology) Delice Bison, Charlestine Massed, NP as Nurse Practitioner (Hematology and Oncology) Harmon Pier, RN as Registered Nurse Truitt Merle, MD as Consulting Physician (Oncology) Royston Bake, RN as Oncology Nurse Navigator (Oncology)  Date of Service:  04/30/2022  CHIEF COMPLAINT: f/u of pancreatic cancer  CURRENT THERAPY:  First line chemo Abraxane/Gemcitabine, q14d, starting 04/02/22  ASSESSMENT & PLAN:  Jessica Livingston is a 81 y.o. female with   1. Pancreatic adenocarcinoma, cT2N1Mx, with indeterminate lung nodules -presented with abdominal discomfort, back pain, and nausea/vomiting. Abdomen CT scan on 03/04/22 showed a pancreatic head mass with venous encasement.   -baseline CA 19.9 1,715, AFP 9.2 on 03/09/22 -EUS/FNA on 03/18/22 showed 3.4 cm mass in uncinate/head of the pancreas, surrounding portal vein, and causing mild biliary obstruction. Cytology confirmed adenocarcinoma. -staging chest CT on 03/30/22 showed four subcentimeter pulmonary nodules in both lungs, with largest 9 mm, no thoracic adenopathy. -she met Dr. Zenia Resides on 04/12/22, plan for reassessment after chemo. -she began abraxane/gemcitabine every 2 weeks on 8/4. She tolerated very well with only mild fatigue. -labs reviewed, ANC 1.4, stable. Adequate for cycle 3 today at same full dose  -plan to repeat scan after cycle 6  2. Chemo toxicities: Constipation with rectal pain -she reports she experienced a blockage/constipation that caused severe rectal pain. She explains the pain  resolved after she loosened the stool and released it.  -we reviewed constipation management, with miralax or magnesium citrate.   3. H/o Left breast DCIS, ER+/PR+, grade 3 -diagnosed in 01/2021. S/p lumpectomy 04/16/21, path showed DCIS. Treated with adjuvant radiation 05/22/21 - 06/18/21. She declined antiestrogen therapy.   4. Genetics -she reports breast cancer in her mother at age 64. -blood test obtained 03/23/22, results were negative. -The patient has 2 living children   5. Abdominal and Back pain -Currently using MS Contin and Norco   6. Social -[place]  [place] are 4 houses on the property and family lives in the other homes on the property. -The patient lives in 78 of these houses alone.  She is independent with her activities of daily living and is active mowing her lawn as well as attending church   7. Comorbidities (hepatic cirrhosis, hx Lap-Band surgery): -Hepatic steatosis noted incidentally on staging CT scan. 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  No evidence of decompensation/complications from this at this time -History of lap band surgery     PLAN: -proceed with third gem/abraxane today -lab, flush, f/u, and gem/abraxane every 2 weeks   No problem-specific Assessment & Plan notes found for this encounter.   SUMMARY OF ONCOLOGIC HISTORY: Oncology History  Ductal carcinoma in situ (DCIS) of left breast  02/17/2021 Initial Diagnosis   Screening mammogram detected left breast calcifications lower inner quadrant 0.9 cm: Biopsy revealed high-grade DCIS with necrosis and calcifications, ER 30%, PR 50% weak   02/25/2021 Cancer Staging   Staging form: Breast, AJCC 8th Edition - Clinical stage from 02/25/2021: Stage 0 (cTis (DCIS), cN0, cM0, G3, ER+,  PR+, HER2: Not Assessed) - Signed by Nicholas Lose, MD on 02/25/2021 Stage prefix: Initial diagnosis Histologic grading  system: 3 grade system   04/16/2021 Cancer Staging   Staging form: Breast, AJCC 8th Edition - Pathologic stage from 04/16/2021: Stage 0 (pTis (DCIS), pN0, cM0, ER+, PR+) - Signed by Gardenia Phlegm, NP on 09/04/2021 Stage prefix: Initial diagnosis Nuclear grade: G3   04/16/2021 Surgery   Left breast lumpectomy with radioactive seed localization   05/21/2021 - 06/18/2021 Radiation Therapy    Radiation Treatment Dates: 05/21/2021 through 06/18/2021 Site Technique Total Dose (Gy) Dose per Fx (Gy) Completed Fx Beam Energies  Breast, Left: Breast_Lt 3D 40.05/40.05 2.67 15/15 6X, 10X  Breast, Left: Breast_Lt_Bst 3D 10/10 2 5/5 6X    Pancreatic cancer ([place])  03/04/2022 Imaging   CT ABDOMEN W CONTRAST   IMPRESSION: 1. Findings highly suspicious for adenocarcinoma in the pancreatic head with resultant upstream atrophy and duct dilatation. Venous encasement as detailed above. No arterial involvement. 2. Suspect cirrhosis and mild steatosis. 3. Coronary artery atherosclerosis. Aortic Atherosclerosis (ICD10-I70.0).   03/09/2022 Tumor Marker   Patient's tumor was tested for the following markers: CA-19.9. Results of the tumor marker test revealed 1715.   03/18/2022 Procedure   EUS-Dr. Ardis Hughs  1. Irregularly shaped, indistinctly bordered, heterogeneous, hypoechoic mass that measures 3.4 cm maximally in the uncinate/head of pancreas. The mass clearly surrounds and attenuates the portal vein near the splenic vein, SMV confluence. The mass is causing mild biliary duct obstruction with the common bile duct measuring 8.6 mm. The mass is also obstructing and dilating the main pancreatic duct which measures up to 9 millimeters in the body and tail. I used a 25-gauge EUS FNB needle to sample the lesion with a transduodenal approach, 2 passes. 2. No peripancreatic adenopathy 3. Limited views of the liver, spleen were normal   03/18/2022 Pathology Results   CYTOLOGY - NON PAP  CASE:  [id number]  PATIENT: [name]  Non-Gynecological Cytology Report   Clinical History: Cirrhosis, pancreatic mass  Specimen Submitted:  A. PANCREAS, HEAD, FINE NEEDLE ASPIRATION:    FINAL MICROSCOPIC DIAGNOSIS:  - Malignant cells consistent with adenocarcinoma   SPECIMEN ADEQUACY:  Satisfactory for evaluation   IMMEDIATE EVALUATION:  SUFFICIENT, POSITIVE FOR MALIGNANCY CONSISTENT WITH ADENO CA. (MPL)    03/18/2022 Cancer Staging   Staging form: Exocrine Pancreas, AJCC 8th Edition - Clinical stage from 03/18/2022: Stage IV (cT2, cN1, cM1) - Signed by Truitt Merle, MD on 04/02/2022   03/23/2022 Initial Diagnosis   Pancreatic cancer ([place])   04/02/2022 - 04/16/2022 Chemotherapy   Patient is on Treatment Plan : PANCREATIC Abraxane / Gemcitabine D1,15 q28d      Genetic Testing   [facility] CancerNext-Expanded Panel was Negative. Report date is 04/03/2022.  The CancerNext-Expanded gene panel offered by [facility] and includes sequencing, rearrangement, and RNA analysis for the following 77 genes: AIP, ALK, APC, ATM, AXIN2, BAP1, BARD1, BLM, BMPR1A, BRCA1, BRCA2, BRIP1, CDC73, CDH1, CDK4, CDKN1B, CDKN2A, CHEK2, CTNNA1, DICER1, FANCC, FH, FLCN, GALNT12, KIF1B, LZTR1, MAX, MEN1, MET, MLH1, MSH2, MSH3, MSH6, MUTYH, NBN, NF1, NF2, NTHL1, PALB2, PHOX2B, PMS2, POT1, PRKAR1A, PTCH1, PTEN, RAD51C, RAD51D, RB1, RECQL, RET, SDHA, SDHAF2, SDHB, SDHC, SDHD, SMAD4, SMARCA4, SMARCB1, SMARCE1, STK11, SUFU, TMEM127, TP53, TSC1, TSC2, VHL and XRCC2 (sequencing and deletion/duplication); EGFR, EGLN1, HOXB13, KIT, MITF, PDGFRA, POLD1, and POLE (sequencing only); EPCAM and GREM1 (deletion/duplication only).    04/02/2022 -  Chemotherapy   Patient is on Treatment Plan : PANCREATIC Abraxane D1,8,15 +  Gemcitabine D1,8,15 q28d        INTERVAL HISTORY:  SUKARI GRIST is here for a follow up of pancreatic cancer. She was last seen by me on 04/16/22. She presents to the clinic alone. She reports she had  constipation with severe rectal pain.   All other systems were reviewed with the patient and are negative.  MEDICAL HISTORY:  Past Medical History:  Diagnosis Date   breast cancer 2022   Left   CAD (coronary artery disease)    Cataract 2018   had sx   Depression    Family history of breast cancer    Heart disease '   History of radiation therapy    Left breast 05/21/21-06/18/21- Dr. Gery Pray   HLD (hyperlipidemia)    Obesity, unspecified    Other abnormal glucose    Other B-complex deficiencies    Other seborrheic keratosis    Pancreatic cancer ([place]) 02/2022   Postmenopausal     SURGICAL HISTORY: Past Surgical History:  Procedure Laterality Date   BREAST LUMPECTOMY WITH RADIOACTIVE SEED LOCALIZATION Left 04/16/2021   Procedure: LEFT BREAST LUMPECTOMY WITH RADIOACTIVE SEED LOCALIZATION;  Surgeon: Jovita Kussmaul, MD;  Location: [facility];  Service: General;  Laterality: Left;   CARDIAC CATHETERIZATION  5/03   mild to mod single vessel disease   CHOLECYSTECTOMY     COLONOSCOPY  2018   HPP/TA   ESOPHAGOGASTRODUODENOSCOPY (EGD) WITH PROPOFOL N/A 03/18/2022   Procedure: ESOPHAGOGASTRODUODENOSCOPY (EGD) WITH PROPOFOL;  Surgeon: [name], MD;  Location: [facility];  Service: Gastroenterology;  Laterality: N/A;   EUS N/A 03/18/2022   Procedure: ESOPHAGEAL ENDOSCOPIC ULTRASOUND (EUS) RADIAL;  Surgeon: [name], MD;  Location: [facility];  Service: Gastroenterology;  Laterality: N/A;   FINE NEEDLE ASPIRATION N/A 03/18/2022   Procedure: FINE NEEDLE ASPIRATION (FNA) LINEAR;  Surgeon: [name], MD;  Location: [facility];  Service: Gastroenterology;  Laterality: N/A;   IR IMAGING GUIDED PORT INSERTION  03/31/2022   LAPAROSCOPIC GASTRIC BANDING  01/13/2010   NECK SURGERY     POLYPECTOMY  2018   TONSILLECTOMY     TUBAL LIGATION Bilateral     I have reviewed the social history and family history with the patient and they are unchanged from previous  note.  ALLERGIES:  is allergic to atorvastatin and rosuvastatin.  MEDICATIONS:  Current Outpatient Medications  Medication Sig Dispense Refill   Besifloxacin HCl (BESIVANCE) 0.6 % SUSP Place 1 drop into both eyes See admin instructions. Instill 1 drop into both eyes 3 times daily once a month on the day of monthly eye injections     Cyanocobalamin (VITAMIN B-12 PO) Take 2 tablets by mouth daily. gummy     HYDROcodone-acetaminophen (NORCO) 10-325 MG tablet Take 1 tablet by mouth every 6 (six) hours as needed for severe pain or moderate pain. 30 tablet 0   morphine (MS CONTIN) 15 MG 12 hr tablet Take 1 tablet (15 mg total) by mouth every 12 (twelve) hours. 30 tablet 0   omeprazole (PRILOSEC) 20 MG capsule Take 20 mg by mouth daily.     ondansetron (ZOFRAN-ODT) 4 MG disintegrating tablet Take 1 tablet (4 mg total) by mouth every 8 (eight) hours as needed for nausea or vomiting. 20 tablet 0   prochlorperazine (COMPAZINE) 10 MG tablet Take 1 tablet (10 mg total) by mouth every 6 (six) hours as needed. 30 tablet 2   VITAMIN D PO Take 2 tablets by mouth daily. gummy  No current facility-administered medications for this visit.    PHYSICAL EXAMINATION: ECOG PERFORMANCE STATUS: 1 - Symptomatic but completely ambulatory  Vitals:   04/30/22 0900  BP: 115/81  Pulse: 64  Resp: 15  Temp: 98.5 F (36.9 C)  SpO2: 98%   Wt Readings from Last 3 Encounters:  04/30/22 187 lb (84.8 kg)  04/16/22 191 lb 3 oz (86.7 kg)  04/02/22 190 lb 9.6 oz (86.5 kg)     GENERAL:alert, no distress and comfortable SKIN: skin color normal, no rashes or significant lesions EYES: normal, Conjunctiva are pink and non-injected, sclera clear  NEURO: alert & oriented x 3 with fluent speech  LABORATORY DATA:  I have reviewed the data as listed    Latest Ref Rng & Units 04/30/2022    8:46 AM 04/16/2022    9:54 AM 04/02/2022    8:25 AM  CBC  WBC 4.0 - 10.5 K/uL 3.2  3.0  6.4   Hemoglobin 12.0 - 15.0 g/dL 13.7  13.8   15.1   Hematocrit 36.0 - 46.0 % 40.2  40.2  44.0   Platelets 150 - 400 K/uL 199  274  182         Latest Ref Rng & Units 04/30/2022    8:46 AM 04/16/2022    9:54 AM 04/02/2022    8:25 AM  CMP  Glucose 70 - 99 mg/dL 144  160  182   BUN 8 - 23 mg/dL _0 Creatinine 0.44 - 1.00 mg/dL 0.62  0.61  0.69   Sodium 135 - 145 mmol/L 138  137  135   Potassium 3.5 - 5.1 mmol/L 4.0  4.1  3.3   Chloride 98 - 111 mmol/L 103  103  99   CO2 22 - 32 mmol/L _1 Calcium 8.9 - 10.3 mg/dL 9.2  9.5  8.9   Total Protein 6.5 - 8.1 g/dL 7.0  6.7  7.2   Total Bilirubin 0.3 - 1.2 mg/dL 0.7  0.6  1.2   Alkaline Phos 38 - 126 U/L 86  85  101   AST 15 - 41 U/L 37  52  33   ALT 0 - 44 U/L 45  52  26       RADIOGRAPHIC STUDIES: I have personally reviewed the radiological images as listed and agreed with the findings in the report. No results found.    Orders Placed This Encounter  Procedures   Cancer antigen 19-9    Standing Status:   Standing    Number of Occurrences:   20    Standing Expiration Date:   05/01/2023   CBC with Differential ([facility] Only)    Standing Status:   Future    Standing Expiration Date:   05/01/2023   CMP ([facility] only)    Standing Status:   Future    Standing Expiration Date:   05/01/2023   CBC with Differential ([facility] Only)    Standing Status:   Future    Standing Expiration Date:   05/15/2023   CMP ([facility] only)    Standing Status:   Future    Standing Expiration Date:   05/15/2023   CBC with Differential ([facility] Only)    Standing Status:   Future    Standing Expiration Date:   05/29/2023   CMP ([facility] only)    Standing Status:   Future    Standing Expiration Date:  05/29/2023   CBC with Differential ([facility] Only)    Standing Status:   Future    Standing Expiration Date:   06/12/2023   CMP ([facility] only)    Standing Status:   Future    Standing Expiration Date:   06/12/2023   All questions were  answered. The patient knows to call the clinic with any problems, questions or concerns. No barriers to learning was detected. The total time spent in the appointment was 30 minutes.     Truitt Merle, MD 04/30/2022   I, Wilburn Mylar, am acting as scribe for Truitt Merle, MD.   I have reviewed the above documentation for accuracy and completeness, and I agree with the above.

## 2022-04-30 NOTE — Progress Notes (Signed)
Okay to treat with ANC 1.4 per Dr. Burr Medico

## 2022-05-03 ENCOUNTER — Encounter: Payer: Self-pay | Admitting: Hematology

## 2022-05-03 NOTE — Progress Notes (Signed)
Nutrition Assessment   Reason for Assessment: Pt request - dietary questions   ASSESSMENT: 81 year old female with pancreatic cancer. She is receiving gemzar + abraxane (started 8/4). Patient is under the care of Dr. Burr Medico.   Past medical history includes DCIS of left breast, cholecystectomy, CAD, HLD, obesity, s/p lap band (2011).   Met with patient during infusion. She reports some altered taste. Patient reports constipation with rectal pain after first infusion. This has resolved. She is taking Miralax. Patient is working to increase intake of water. Adding lemon has been helpful with improving taste. Patient reports recent bowel movements have been foul smelling and pale/grey in color. 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 She is not currently on PERT. Patient has lots of questions about what she can eat. She would like to have a piece of cake on occasion. Patient asking if this is okay as she was told sugar feeds cancer.    Medications: B12, Norco, MS contin, prilosec, zofran, compazine, MVI   Labs: glucose 144, ALT 45   Anthropometrics:   Height: 5'3" Weight: 187 lb UBW: 209 lb (Aug 2022) BMI: 33.13   NUTRITION DIAGNOSIS: Food and nutrition related knowledge deficit related to cancer as evidenced by no prior need for related nutrition information    INTERVENTION:  Educated on small frequent meals and snacks with adequate calories and protein - handout with ideas provided Discussed strategies for altered taste - handout with tips provided Educated on all cells including cancer cells requiring glucose for energy - fact sheet provided Strive for weight maintenance Continue bowel regimen per MD Recommend trial of Creon for suspected pancreatic insufficiency  Contact information provided    MONITORING, EVALUATION, GOAL: Patient will tolerate increased calories and protein to minimize further weight loss   Next Visit: To be scheduled as needed with treatment

## 2022-05-07 ENCOUNTER — Encounter (INDEPENDENT_AMBULATORY_CARE_PROVIDER_SITE_OTHER): Payer: PPO | Admitting: Ophthalmology

## 2022-05-07 DIAGNOSIS — H43813 Vitreous degeneration, bilateral: Secondary | ICD-10-CM | POA: Diagnosis not present

## 2022-05-07 DIAGNOSIS — H353231 Exudative age-related macular degeneration, bilateral, with active choroidal neovascularization: Secondary | ICD-10-CM

## 2022-05-14 ENCOUNTER — Inpatient Hospital Stay: Payer: PPO

## 2022-05-14 ENCOUNTER — Other Ambulatory Visit: Payer: Self-pay

## 2022-05-14 ENCOUNTER — Inpatient Hospital Stay (HOSPITAL_BASED_OUTPATIENT_CLINIC_OR_DEPARTMENT_OTHER): Payer: PPO | Admitting: Hematology

## 2022-05-14 ENCOUNTER — Encounter: Payer: Self-pay | Admitting: Hematology

## 2022-05-14 VITALS — BP 133/78 | HR 84 | Temp 98.3°F | Resp 18 | Ht 63.0 in | Wt 183.9 lb

## 2022-05-14 DIAGNOSIS — C250 Malignant neoplasm of head of pancreas: Secondary | ICD-10-CM

## 2022-05-14 DIAGNOSIS — Z5111 Encounter for antineoplastic chemotherapy: Secondary | ICD-10-CM | POA: Diagnosis not present

## 2022-05-14 DIAGNOSIS — Z95828 Presence of other vascular implants and grafts: Secondary | ICD-10-CM

## 2022-05-14 LAB — CBC WITH DIFFERENTIAL (CANCER CENTER ONLY)
Abs Immature Granulocytes: 0.01 10*3/uL (ref 0.00–0.07)
Basophils Absolute: 0 10*3/uL (ref 0.0–0.1)
Basophils Relative: 1 %
Eosinophils Absolute: 0.1 10*3/uL (ref 0.0–0.5)
Eosinophils Relative: 2 %
HCT: 39 % (ref 36.0–46.0)
Hemoglobin: 13.1 g/dL (ref 12.0–15.0)
Immature Granulocytes: 0 %
Lymphocytes Relative: 25 %
Lymphs Abs: 0.9 10*3/uL (ref 0.7–4.0)
MCH: 32.2 pg (ref 26.0–34.0)
MCHC: 33.6 g/dL (ref 30.0–36.0)
MCV: 95.8 fL (ref 80.0–100.0)
Monocytes Absolute: 0.8 10*3/uL (ref 0.1–1.0)
Monocytes Relative: 23 %
Neutro Abs: 1.7 10*3/uL (ref 1.7–7.7)
Neutrophils Relative %: 49 %
Platelet Count: 194 10*3/uL (ref 150–400)
RBC: 4.07 MIL/uL (ref 3.87–5.11)
RDW: 15.3 % (ref 11.5–15.5)
WBC Count: 3.4 10*3/uL — ABNORMAL LOW (ref 4.0–10.5)
nRBC: 0 % (ref 0.0–0.2)

## 2022-05-14 LAB — CMP (CANCER CENTER ONLY)
ALT: 47 U/L — ABNORMAL HIGH (ref 0–44)
AST: 46 U/L — ABNORMAL HIGH (ref 15–41)
Albumin: 3.6 g/dL (ref 3.5–5.0)
Alkaline Phosphatase: 75 U/L (ref 38–126)
Anion gap: 8 (ref 5–15)
BUN: 12 mg/dL (ref 8–23)
CO2: 27 mmol/L (ref 22–32)
Calcium: 8.7 mg/dL — ABNORMAL LOW (ref 8.9–10.3)
Chloride: 104 mmol/L (ref 98–111)
Creatinine: 0.57 mg/dL (ref 0.44–1.00)
GFR, Estimated: >60 mL/min (ref >60)
Glucose, Bld: 184 mg/dL — ABNORMAL HIGH (ref 70–99)
Potassium: 3.3 mmol/L — ABNORMAL LOW (ref 3.5–5.1)
Sodium: 139 mmol/L (ref 135–145)
Total Bilirubin: 0.7 mg/dL (ref 0.3–1.2)
Total Protein: 6.8 g/dL (ref 6.5–8.1)

## 2022-05-14 MED ORDER — PROCHLORPERAZINE MALEATE 10 MG PO TABS
10.0000 mg | ORAL_TABLET | Freq: Once | ORAL | Status: AC
  Administered 2022-05-14: 10 mg via ORAL
  Filled 2022-05-14: qty 1

## 2022-05-14 MED ORDER — SODIUM CHLORIDE 0.9 % IV SOLN
1000.0000 mg/m2 | Freq: Once | INTRAVENOUS | Status: AC
  Administered 2022-05-14: 1938 mg via INTRAVENOUS
  Filled 2022-05-14: qty 50.97

## 2022-05-14 MED ORDER — SODIUM CHLORIDE 0.9 % IV SOLN: Freq: Once | INTRAVENOUS | Status: AC

## 2022-05-14 MED ORDER — PACLITAXEL PROTEIN-BOUND CHEMO INJECTION 100 MG
125.0000 mg/m2 | Freq: Once | INTRAVENOUS | Status: AC
  Administered 2022-05-14: 250 mg via INTRAVENOUS
  Filled 2022-05-14: qty 50

## 2022-05-14 MED ORDER — HEPARIN SOD (PORK) LOCK FLUSH 100 UNIT/ML IV SOLN
500.0000 [IU] | Freq: Once | INTRAVENOUS | Status: AC | PRN
  Administered 2022-05-14: 500 [IU]

## 2022-05-14 MED ORDER — PANCRELIPASE (LIP-PROT-AMYL) 36000-114000 UNITS PO CPEP: ORAL_CAPSULE | ORAL | 0 refills | Status: DC

## 2022-05-14 MED ORDER — SODIUM CHLORIDE 0.9% FLUSH
10.0000 mL | Freq: Once | INTRAVENOUS | Status: AC
  Administered 2022-05-14: 10 mL

## 2022-05-14 MED ORDER — SODIUM CHLORIDE 0.9% FLUSH
10.0000 mL | INTRAVENOUS | Status: DC | PRN
  Administered 2022-05-14: 10 mL

## 2022-05-14 NOTE — Progress Notes (Signed)
[facility]   [phone] [phone]) [phone]   Clinic Follow up Note   Patient Care Team: Deland Pretty, MD as PCP - General (Internal Medicine) Mauro Kaufmann, RN as Oncology Nurse Navigator Rockwell Germany, RN as Oncology Nurse Navigator Jovita Kussmaul, MD as Consulting Physician (General Surgery) Nicholas Lose, MD as Consulting Physician (Hematology and Oncology) Gery Pray, MD as Consulting Physician (Radiation Oncology) Delice Bison, Charlestine Massed, NP as Nurse Practitioner (Hematology and Oncology) Harmon Pier, RN as Registered Nurse Truitt Merle, MD as Consulting Physician (Oncology) Royston Bake, RN as Oncology Nurse Navigator (Oncology)  Date of Service:  05/14/2022  CHIEF COMPLAINT: f/u of pancreatic cancer  CURRENT THERAPY:  First line chemo Abraxane/Gemcitabine, q14d, starting 04/02/22  ASSESSMENT & PLAN:  Jessica Livingston is a 81 y.o. female with   1. Pancreatic adenocarcinoma, cT2N1Mx, with indeterminate lung nodules -presented with abdominal discomfort, back pain, and nausea/vomiting. Abdomen CT scan on 03/04/22 showed pancreatic head mass with venous encasement.   -baseline CA 19.9 1,715, AFP 9.2 on 03/09/22 -EUS/FNA on 03/18/22 showed 3.4 cm mass in uncinate/head of the pancreas, surrounding portal vein, and causing mild biliary obstruction. Cytology confirmed adenocarcinoma. -staging chest CT on 03/30/22 showed four subcentimeter pulmonary nodules in both lungs, with largest 9 mm, no thoracic adenopathy. -she met Dr. Zenia Resides on 04/12/22, plan for reassessment after chemo. -she began abraxane/gemcitabine every 2 weeks on 04/02/22. She tolerates very well with only mild fatigue. She also reports resolution of her pains, which indicating good clinical response  -labs reviewed, ANC improved to 1.7. Adequate for cycle 4 today at same full dose  -plan to repeat scan after cycle 6   2. Chemo toxicities: Diarrhea, Low appetite -she is now having mild  diarrhea from chemo. We discussed option of Creon as a pancreatic enzyme replacement. I will call in for her to try. -she reports low appetite for first week that recovers by second week. I encouraged her to watch her weight.   3. H/o Left breast DCIS, ER+/PR+, grade 3 -diagnosed in 01/2021. S/p lumpectomy 04/16/21, path showed DCIS. Treated with adjuvant radiation 05/22/21 - 06/18/21. She declined antiestrogen therapy.   4. Genetics -she reports breast cancer in her mother at age 66. -blood test obtained 03/23/22, results were negative. -The patient has 2 living children     PLAN: -proceed with 4th gem/abraxane today -I called in creon -lab, flush, f/u, and gem/abraxane every 2 weeks -will order restaging CT on next visit    No problem-specific Assessment & Plan notes found for this encounter.   SUMMARY OF ONCOLOGIC HISTORY: Oncology History  Ductal carcinoma in situ (DCIS) of left breast  02/17/2021 Initial Diagnosis   Screening mammogram detected left breast calcifications lower inner quadrant 0.9 cm: Biopsy revealed high-grade DCIS with necrosis and calcifications, ER 30%, PR 50% weak   02/25/2021 Cancer Staging   Staging form: Breast, AJCC 8th Edition - Clinical stage from 02/25/2021: Stage 0 (cTis (DCIS), cN0, cM0, G3, ER+, PR+, HER2: Not Assessed) - Signed by Nicholas Lose, MD on 02/25/2021 Stage prefix: Initial diagnosis Histologic grading system: 3 grade system   04/16/2021 Cancer Staging   Staging form: Breast, AJCC 8th Edition - Pathologic stage from 04/16/2021: Stage 0 (pTis (DCIS), pN0, cM0, ER+, PR+) - Signed by Gardenia Phlegm, NP on 09/04/2021 Stage prefix: Initial diagnosis Nuclear grade: G3   04/16/2021 Surgery   Left breast lumpectomy with radioactive seed localization   05/21/2021 - 06/18/2021 Radiation Therapy  Radiation Treatment Dates: 05/21/2021 through 06/18/2021 Site Technique Total Dose (Gy) Dose per Fx (Gy) Completed Fx Beam Energies  Breast,  Left: Breast_Lt 3D 40.05/40.05 2.67 15/15 6X, 10X  Breast, Left: Breast_Lt_Bst 3D 10/10 2 5/5 6X    Pancreatic cancer ([place])  03/04/2022 Imaging   CT ABDOMEN W CONTRAST   IMPRESSION: 1. Findings highly suspicious for adenocarcinoma in the pancreatic head with resultant upstream atrophy and duct dilatation. Venous encasement as detailed above. No arterial involvement. 2. Suspect cirrhosis and mild steatosis. 3. Coronary artery atherosclerosis. Aortic Atherosclerosis (ICD10-I70.0).   03/09/2022 Tumor Marker   Patient's tumor was tested for the following markers: CA-19.9. Results of the tumor marker test revealed 1715.   03/18/2022 Procedure   EUS-Dr. Ardis Hughs  1. Irregularly shaped, indistinctly bordered, heterogeneous, hypoechoic mass that measures 3.4 cm maximally in the uncinate/head of pancreas. The mass clearly surrounds and attenuates the portal vein near the splenic vein, SMV confluence. The mass is causing mild biliary duct obstruction with the common bile duct measuring 8.6 mm. The mass is also obstructing and dilating the main pancreatic duct which measures up to 9 millimeters in the body and tail. I used a 25-gauge EUS FNB needle to sample the lesion with a transduodenal approach, 2 passes. 2. No peripancreatic adenopathy 3. Limited views of the liver, spleen were normal   03/18/2022 Pathology Results   CYTOLOGY - NON PAP  CASE: [id number]  PATIENT: Jessica Livingston  Non-Gynecological Cytology Report   Clinical History: Cirrhosis, pancreatic mass  Specimen Submitted:  A. PANCREAS, HEAD, FINE NEEDLE ASPIRATION:    FINAL MICROSCOPIC DIAGNOSIS:  - Malignant cells consistent with adenocarcinoma   SPECIMEN ADEQUACY:  Satisfactory for evaluation   IMMEDIATE EVALUATION:  SUFFICIENT, POSITIVE FOR MALIGNANCY CONSISTENT WITH ADENO CA. (MPL)    03/18/2022 Cancer Staging   Staging form: Exocrine Pancreas, AJCC 8th Edition - Clinical stage from 03/18/2022: Stage IV (cT2, cN1,  cM1) - Signed by Truitt Merle, MD on 04/02/2022   03/23/2022 Initial Diagnosis   Pancreatic cancer ([place])   04/02/2022 - 04/16/2022 Chemotherapy   Patient is on Treatment Plan : PANCREATIC Abraxane / Gemcitabine D1,15 q28d      Genetic Testing   [facility] CancerNext-Expanded Panel was Negative. Report date is 04/03/2022.  The CancerNext-Expanded gene panel offered by [facility] and includes sequencing, rearrangement, and RNA analysis for the following 77 genes: AIP, ALK, APC, ATM, AXIN2, BAP1, BARD1, BLM, BMPR1A, BRCA1, BRCA2, BRIP1, CDC73, CDH1, CDK4, CDKN1B, CDKN2A, CHEK2, CTNNA1, DICER1, FANCC, FH, FLCN, GALNT12, KIF1B, LZTR1, MAX, MEN1, MET, MLH1, MSH2, MSH3, MSH6, MUTYH, NBN, NF1, NF2, NTHL1, PALB2, PHOX2B, PMS2, POT1, PRKAR1A, PTCH1, PTEN, RAD51C, RAD51D, RB1, RECQL, RET, SDHA, SDHAF2, SDHB, SDHC, SDHD, SMAD4, SMARCA4, SMARCB1, SMARCE1, STK11, SUFU, TMEM127, TP53, TSC1, TSC2, VHL and XRCC2 (sequencing and deletion/duplication); EGFR, EGLN1, HOXB13, KIT, MITF, PDGFRA, POLD1, and POLE (sequencing only); EPCAM and GREM1 (deletion/duplication only).    04/02/2022 -  Chemotherapy   Patient is on Treatment Plan : PANCREATIC Abraxane D1,8,15 + Gemcitabine D1,8,15 q28d        INTERVAL HISTORY:  Jessica Livingston is here for a follow up of pancreatic cancer. She was last seen by me on 04/30/22. She presents to the clinic alone. She reports she felt very well this past week. She reports the first week she had a lot of diarrhea and fatigue. She notes her appetite returned this past week as well.   All other systems were reviewed with the patient and are negative.  MEDICAL HISTORY:  Past Medical History:  Diagnosis Date   breast cancer 2022   Left   CAD (coronary artery disease)    Cataract 2018   had sx   Depression    Family history of breast cancer    Heart disease '   History of radiation therapy    Left breast 05/21/21-06/18/21- Dr. Gery Pray   HLD (hyperlipidemia)    Obesity,  unspecified    Other abnormal glucose    Other B-complex deficiencies    Other seborrheic keratosis    Pancreatic cancer ([place]) 02/2022   Postmenopausal     SURGICAL HISTORY: Past Surgical History:  Procedure Laterality Date   BREAST LUMPECTOMY WITH RADIOACTIVE SEED LOCALIZATION Left 04/16/2021   Procedure: LEFT BREAST LUMPECTOMY WITH RADIOACTIVE SEED LOCALIZATION;  Surgeon: Jovita Kussmaul, MD;  Location: [facility];  Service: General;  Laterality: Left;   CARDIAC CATHETERIZATION  5/03   mild to mod single vessel disease   CHOLECYSTECTOMY     COLONOSCOPY  2018   HPP/TA   ESOPHAGOGASTRODUODENOSCOPY (EGD) WITH PROPOFOL N/A 03/18/2022   Procedure: ESOPHAGOGASTRODUODENOSCOPY (EGD) WITH PROPOFOL;  Surgeon: [name], MD;  Location: [facility];  Service: Gastroenterology;  Laterality: N/A;   EUS N/A 03/18/2022   Procedure: ESOPHAGEAL ENDOSCOPIC ULTRASOUND (EUS) RADIAL;  Surgeon: [name], MD;  Location: [facility];  Service: Gastroenterology;  Laterality: N/A;   FINE NEEDLE ASPIRATION N/A 03/18/2022   Procedure: FINE NEEDLE ASPIRATION (FNA) LINEAR;  Surgeon: [name], MD;  Location: [facility];  Service: Gastroenterology;  Laterality: N/A;   IR IMAGING GUIDED PORT INSERTION  03/31/2022   LAPAROSCOPIC GASTRIC BANDING  01/13/2010   NECK SURGERY     POLYPECTOMY  2018   TONSILLECTOMY     TUBAL LIGATION Bilateral     I have reviewed the social history and family history with the patient and they are unchanged from previous note.  ALLERGIES:  is allergic to atorvastatin and rosuvastatin.  MEDICATIONS:  Current Outpatient Medications  Medication Sig Dispense Refill   lipase/protease/amylase (CREON) 36000 UNITS CPEP capsule Take 1 capsule (36,000 Units total) by mouth 3 (three) times daily with meals AND 1 capsule (36,000 Units total) 3 (three) times daily before meals. 60 capsule 0   Besifloxacin HCl (BESIVANCE) 0.6 % SUSP Place 1 drop into both eyes See admin instructions.  Instill 1 drop into both eyes 3 times daily once a month on the day of monthly eye injections     Cyanocobalamin (VITAMIN B-12 PO) Take 2 tablets by mouth daily. gummy     HYDROcodone-acetaminophen (NORCO) 10-325 MG tablet Take 1 tablet by mouth every 6 (six) hours as needed for severe pain or moderate pain. 30 tablet 0   omeprazole (PRILOSEC) 20 MG capsule Take 20 mg by mouth daily.     ondansetron (ZOFRAN-ODT) 4 MG disintegrating tablet Take 1 tablet (4 mg total) by mouth every 8 (eight) hours as needed for nausea or vomiting. 20 tablet 0   prochlorperazine (COMPAZINE) 10 MG tablet Take 1 tablet (10 mg total) by mouth every 6 (six) hours as needed. 30 tablet 2   VITAMIN D PO Take 2 tablets by mouth daily. gummy     No current facility-administered medications for this visit.    PHYSICAL EXAMINATION: ECOG PERFORMANCE STATUS: 1 - Symptomatic but completely ambulatory  Vitals:   05/14/22 1023  BP: 133/78  Pulse: 84  Resp: 18  Temp: 98.3 F (36.8 C)  SpO2: 100%   Wt Readings from Last 3  Encounters:  05/14/22 183 lb 14.4 oz (83.4 kg)  04/30/22 187 lb (84.8 kg)  04/16/22 191 lb 3 oz (86.7 kg)     GENERAL:alert, no distress and comfortable SKIN: skin color normal, no rashes or significant lesions EYES: normal, Conjunctiva are pink and non-injected, sclera clear  NEURO: alert & oriented x 3 with fluent speech  LABORATORY DATA:  I have reviewed the data as listed    Latest Ref Rng & Units 05/14/2022    9:51 AM 04/30/2022    8:46 AM 04/16/2022    9:54 AM  CBC  WBC 4.0 - 10.5 K/uL 3.4  3.2  3.0   Hemoglobin 12.0 - 15.0 g/dL 13.1  13.7  13.8   Hematocrit 36.0 - 46.0 % 39.0  40.2  40.2   Platelets 150 - 400 K/uL 194  199  274         Latest Ref Rng & Units 05/14/2022    9:51 AM 04/30/2022    8:46 AM 04/16/2022    9:54 AM  CMP  Glucose 70 - 99 mg/dL 184  144  160   BUN 8 - 23 mg/dL 12  10  17    Creatinine 0.44 - 1.00 mg/dL 0.57  0.62  0.61   Sodium 135 - 145 mmol/L 139  138  137    Potassium 3.5 - 5.1 mmol/L 3.3  4.0  4.1   Chloride 98 - 111 mmol/L 104  103  103   CO2 22 - 32 mmol/L 27  29  28    Calcium 8.9 - 10.3 mg/dL 8.7  9.2  9.5   Total Protein 6.5 - 8.1 g/dL 6.8  7.0  6.7   Total Bilirubin 0.3 - 1.2 mg/dL 0.7  0.7  0.6   Alkaline Phos 38 - 126 U/L 75  86  85   AST 15 - 41 U/L 46  37  52   ALT 0 - 44 U/L 47  45  52       RADIOGRAPHIC STUDIES: I have personally reviewed the radiological images as listed and agreed with the findings in the report. No results found.    No orders of the defined types were placed in this encounter.  All questions were answered. The patient knows to call the clinic with any problems, questions or concerns. No barriers to learning was detected. The total time spent in the appointment was 30 minutes.     Truitt Merle, MD 05/14/2022   I, Wilburn Mylar, am acting as scribe for Truitt Merle, MD.   I have reviewed the above documentation for accuracy and completeness, and I agree with the above.

## 2022-05-14 NOTE — Patient Instructions (Signed)
[facility] MEDICAL ONCOLOGY  Discharge Instructions: Thank you for choosing [facility] to provide your oncology and hematology care.   If you have a lab appointment with the Cancer Center, please go directly to the Cancer Center and check in at the registration area.   Wear comfortable clothing and clothing appropriate for easy access to any Portacath or PICC line.   We strive to give you quality time with your provider. You may need to reschedule your appointment if you arrive late (15 or more minutes).  Arriving late affects you and other patients whose appointments are after yours.  Also, if you miss three or more appointments without notifying the office, you may be dismissed from the clinic at the provider's discretion.      For prescription refill requests, have your pharmacy contact our office and allow 72 hours for refills to be completed.    Today you received the following chemotherapy and/or immunotherapy agents: Abraxane/Gemzar      To help prevent nausea and vomiting after your treatment, we encourage you to take your nausea medication as directed.  BELOW ARE SYMPTOMS THAT SHOULD BE REPORTED IMMEDIATELY: *FEVER GREATER THAN 100.4 F (38 C) OR HIGHER *CHILLS OR SWEATING *NAUSEA AND VOMITING THAT IS NOT CONTROLLED WITH YOUR NAUSEA MEDICATION *UNUSUAL SHORTNESS OF BREATH *UNUSUAL BRUISING OR BLEEDING *URINARY PROBLEMS (pain or burning when urinating, or frequent urination) *BOWEL PROBLEMS (unusual diarrhea, constipation, pain near the anus) TENDERNESS IN MOUTH AND THROAT WITH OR WITHOUT PRESENCE OF ULCERS (sore throat, sores in mouth, or a toothache) UNUSUAL RASH, SWELLING OR PAIN  UNUSUAL VAGINAL DISCHARGE OR ITCHING   Items with * indicate a potential emergency and should be followed up as soon as possible or go to the Emergency Department if any problems should occur.  Please show the CHEMOTHERAPY ALERT CARD or IMMUNOTHERAPY ALERT CARD at  check-in to the Emergency Department and triage nurse.  Should you have questions after your visit or need to cancel or reschedule your appointment, please contact [facility] MEDICAL ONCOLOG[facility]  Dept: [phone]  and follow the prompts.  Office hours are 8:00 a.m. to 4:30 p.m. Monday - Friday. Please note that voicemails left after 4:00 p.m. may not be returned until the following business day.  We are closed weekends and major holidays. You have access to a nurse at all times for urgent questions. Please call the main number to the clinic Dept: [phone] and follow the prompts.   For any non-urgent questions, you may also contact your provider using MyChart. We now offer e-Visits for anyone 18 and older to request care online for non-urgent symptoms. For details visit [email or web address].   Also download the MyChart app! Go to the app store, search "MyChart", open the app, select [facility], and log in with your MyChart username and password.  Masks are optional in the cancer centers. If you would like for your care team to wear a mask while they are taking care of you, please let them know. You may have one support person who is at least 81 years old accompany you for your appointments. 

## 2022-05-15 LAB — CANCER ANTIGEN 19-9: CA 19-9: 710 U/mL — ABNORMAL HIGH (ref 0–35)

## 2022-05-28 ENCOUNTER — Inpatient Hospital Stay: Payer: PPO

## 2022-05-28 ENCOUNTER — Encounter: Payer: Self-pay | Admitting: Hematology

## 2022-05-28 ENCOUNTER — Other Ambulatory Visit: Payer: Self-pay

## 2022-05-28 ENCOUNTER — Inpatient Hospital Stay (HOSPITAL_BASED_OUTPATIENT_CLINIC_OR_DEPARTMENT_OTHER): Payer: PPO | Admitting: Hematology

## 2022-05-28 VITALS — BP 129/90 | HR 77 | Temp 97.7°F | Resp 17 | Wt 182.5 lb

## 2022-05-28 DIAGNOSIS — C250 Malignant neoplasm of head of pancreas: Secondary | ICD-10-CM

## 2022-05-28 DIAGNOSIS — D0512 Intraductal carcinoma in situ of left breast: Secondary | ICD-10-CM | POA: Diagnosis not present

## 2022-05-28 DIAGNOSIS — Z95828 Presence of other vascular implants and grafts: Secondary | ICD-10-CM

## 2022-05-28 DIAGNOSIS — Z5111 Encounter for antineoplastic chemotherapy: Secondary | ICD-10-CM | POA: Diagnosis not present

## 2022-05-28 LAB — CMP (CANCER CENTER ONLY)
ALT: 37 U/L (ref 0–44)
AST: 42 U/L — ABNORMAL HIGH (ref 15–41)
Albumin: 3.8 g/dL (ref 3.5–5.0)
Alkaline Phosphatase: 91 U/L (ref 38–126)
Anion gap: 4 — ABNORMAL LOW (ref 5–15)
BUN: 10 mg/dL (ref 8–23)
CO2: 30 mmol/L (ref 22–32)
Calcium: 8.6 mg/dL — ABNORMAL LOW (ref 8.9–10.3)
Chloride: 105 mmol/L (ref 98–111)
Creatinine: 0.54 mg/dL (ref 0.44–1.00)
GFR, Estimated: >60 mL/min (ref >60)
Glucose, Bld: 133 mg/dL — ABNORMAL HIGH (ref 70–99)
Potassium: 3.8 mmol/L (ref 3.5–5.1)
Sodium: 139 mmol/L (ref 135–145)
Total Bilirubin: 0.6 mg/dL (ref 0.3–1.2)
Total Protein: 6.6 g/dL (ref 6.5–8.1)

## 2022-05-28 LAB — CBC WITH DIFFERENTIAL (CANCER CENTER ONLY)
Abs Immature Granulocytes: 0.01 10*3/uL (ref 0.00–0.07)
Basophils Absolute: 0 10*3/uL (ref 0.0–0.1)
Basophils Relative: 1 %
Eosinophils Absolute: 0 10*3/uL (ref 0.0–0.5)
Eosinophils Relative: 1 %
HCT: 36.4 % (ref 36.0–46.0)
Hemoglobin: 12.2 g/dL (ref 12.0–15.0)
Immature Granulocytes: 0 %
Lymphocytes Relative: 29 %
Lymphs Abs: 0.9 10*3/uL (ref 0.7–4.0)
MCH: 32.4 pg (ref 26.0–34.0)
MCHC: 33.5 g/dL (ref 30.0–36.0)
MCV: 96.6 fL (ref 80.0–100.0)
Monocytes Absolute: 0.8 10*3/uL (ref 0.1–1.0)
Monocytes Relative: 29 %
Neutro Abs: 1.2 10*3/uL — ABNORMAL LOW (ref 1.7–7.7)
Neutrophils Relative %: 40 %
Platelet Count: 191 10*3/uL (ref 150–400)
RBC: 3.77 MIL/uL — ABNORMAL LOW (ref 3.87–5.11)
RDW: 16.2 % — ABNORMAL HIGH (ref 11.5–15.5)
WBC Count: 3 10*3/uL — ABNORMAL LOW (ref 4.0–10.5)
nRBC: 0 % (ref 0.0–0.2)

## 2022-05-28 MED ORDER — HEPARIN SOD (PORK) LOCK FLUSH 100 UNIT/ML IV SOLN
500.0000 [IU] | Freq: Once | INTRAVENOUS | Status: AC | PRN
  Administered 2022-05-28: 500 [IU]

## 2022-05-28 MED ORDER — SODIUM CHLORIDE 0.9 % IV SOLN: Freq: Once | INTRAVENOUS | Status: AC

## 2022-05-28 MED ORDER — SODIUM CHLORIDE 0.9 % IV SOLN
1000.0000 mg/m2 | Freq: Once | INTRAVENOUS | Status: AC
  Administered 2022-05-28: 1938 mg via INTRAVENOUS
  Filled 2022-05-28: qty 50.97

## 2022-05-28 MED ORDER — SODIUM CHLORIDE 0.9% FLUSH
10.0000 mL | INTRAVENOUS | Status: DC | PRN
  Administered 2022-05-28: 10 mL

## 2022-05-28 MED ORDER — SODIUM CHLORIDE 0.9% FLUSH
10.0000 mL | Freq: Once | INTRAVENOUS | Status: AC
  Administered 2022-05-28: 10 mL

## 2022-05-28 MED ORDER — PACLITAXEL PROTEIN-BOUND CHEMO INJECTION 100 MG
125.0000 mg/m2 | Freq: Once | INTRAVENOUS | Status: AC
  Administered 2022-05-28: 250 mg via INTRAVENOUS
  Filled 2022-05-28: qty 50

## 2022-05-28 MED ORDER — PROCHLORPERAZINE MALEATE 10 MG PO TABS
10.0000 mg | ORAL_TABLET | Freq: Once | ORAL | Status: AC
  Administered 2022-05-28: 10 mg via ORAL
  Filled 2022-05-28: qty 1

## 2022-05-28 NOTE — Progress Notes (Signed)
[place]   [phone] [phone]   Clinic Follow up Note   Patient Care Team: Deland Pretty, MD as PCP - General (Internal Medicine) Mauro Kaufmann, RN as Oncology Nurse Navigator Rockwell Germany, RN as Oncology Nurse Navigator Jovita Kussmaul, MD as Consulting Physician (General Surgery) Nicholas Lose, MD as Consulting Physician (Hematology and Oncology) Gery Pray, MD as Consulting Physician (Radiation Oncology) Delice Bison, Charlestine Massed, NP as Nurse Practitioner (Hematology and Oncology) Harmon Pier, RN as Registered Nurse Truitt Merle, MD as Consulting Physician (Oncology) Royston Bake, RN as Oncology Nurse Navigator (Oncology)  Date of Service:  05/28/2022  CHIEF COMPLAINT: f/u of pancreatic cancer  CURRENT THERAPY:  First line chemo Abraxane/Gemcitabine, q14d, starting 04/02/22  ASSESSMENT & PLAN:  Jessica Livingston is a 81 y.o. female with   1. Pancreatic adenocarcinoma, cT2N1Mx, with indeterminate lung nodules -presented with abdominal discomfort, back pain, and nausea/vomiting. Abdomen CT scan on 03/04/22 showed pancreatic head mass with venous encasement.   -baseline CA 19.9 1,715, AFP 9.2 on 03/09/22 -EUS/FNA on 03/18/22 showed 3.4 cm mass in uncinate/head of the pancreas, surrounding portal vein, and causing mild biliary obstruction. Cytology confirmed adenocarcinoma. -staging chest CT on 03/30/22 showed four subcentimeter pulmonary nodules in both lungs, with largest 9 mm, no thoracic adenopathy. -she met Dr. Zenia Resides on 04/12/22, plan for reassessment after chemo. -she began abraxane/gemcitabine every 2 weeks on 04/02/22. She tolerates very well with only mild fatigue. She also reports resolution of her pains, which indicating good clinical response  -labs reviewed, ANC 1.2. Adequate for cycle 5 today at same full dose  -plan to repeat scan after cycle 6; I ordered today.   2. Chemo toxicities: Diarrhea, Low appetite -now on creon -weight  is stable lately    3. H/o Left breast DCIS, ER+/PR+, grade 3 -diagnosed in 01/2021. S/p lumpectomy 04/16/21, path showed DCIS. Treated with adjuvant radiation 05/22/21 - 06/18/21. She declined antiestrogen therapy.   4. Genetics -she reports breast cancer in her mother at age 34. -blood test obtained 03/23/22, results were negative. -The patient has 2 living children     PLAN: -proceed with 5th gem/abraxane today -lab, flush, f/u, and gem/abraxane every 2 weeks -plan for restaging CT in 3-4 weeks   No problem-specific Assessment & Plan notes found for this encounter.   SUMMARY OF ONCOLOGIC HISTORY: Oncology History  Ductal carcinoma in situ (DCIS) of left breast  02/17/2021 Initial Diagnosis   Screening mammogram detected left breast calcifications lower inner quadrant 0.9 cm: Biopsy revealed high-grade DCIS with necrosis and calcifications, ER 30%, PR 50% weak   02/25/2021 Cancer Staging   Staging form: Breast, AJCC 8th Edition - Clinical stage from 02/25/2021: Stage 0 (cTis (DCIS), cN0, cM0, G3, ER+, PR+, HER2: Not Assessed) - Signed by Nicholas Lose, MD on 02/25/2021 Stage prefix: Initial diagnosis Histologic grading system: 3 grade system   04/16/2021 Cancer Staging   Staging form: Breast, AJCC 8th Edition - Pathologic stage from 04/16/2021: Stage 0 (pTis (DCIS), pN0, cM0, ER+, PR+) - Signed by Gardenia Phlegm, NP on 09/04/2021 Stage prefix: Initial diagnosis Nuclear grade: G3   04/16/2021 Surgery   Left breast lumpectomy with radioactive seed localization   05/21/2021 - 06/18/2021 Radiation Therapy    Radiation Treatment Dates: 05/21/2021 through 06/18/2021 Site Technique Total Dose (Gy) Dose per Fx (Gy) Completed Fx Beam Energies  Breast, Left: Breast_Lt 3D 40.05/40.05 2.67 15/15 6X, 10X  Breast, Left: Breast_Lt_Bst 3D 10/10 2 5/5 6X  Pancreatic cancer ([place])  03/04/2022 Imaging   CT ABDOMEN W CONTRAST   IMPRESSION: 1. Findings highly suspicious for adenocarcinoma  in the pancreatic head with resultant upstream atrophy and duct dilatation. Venous encasement as detailed above. No arterial involvement. 2. Suspect cirrhosis and mild steatosis. 3. Coronary artery atherosclerosis. Aortic Atherosclerosis (ICD10-I70.0).   03/09/2022 Tumor Marker   Patient's tumor was tested for the following markers: CA-19.9. Results of the tumor marker test revealed 1715.   03/18/2022 Procedure   EUS-Dr. Ardis Hughs  1. Irregularly shaped, indistinctly bordered, heterogeneous, hypoechoic mass that measures 3.4 cm maximally in the uncinate/head of pancreas. The mass clearly surrounds and attenuates the portal vein near the splenic vein, SMV confluence. The mass is causing mild biliary duct obstruction with the common bile duct measuring 8.6 mm. The mass is also obstructing and dilating the main pancreatic duct which measures up to 9 millimeters in the body and tail. I used a 25-gauge EUS FNB needle to sample the lesion with a transduodenal approach, 2 passes. 2. No peripancreatic adenopathy 3. Limited views of the liver, spleen were normal   03/18/2022 Pathology Results   CYTOLOGY - NON PAP  CASE: [id number]  PATIENT: Jessica Livingston  Non-Gynecological Cytology Report   Clinical History: Cirrhosis, pancreatic mass  Specimen Submitted:  A. PANCREAS, HEAD, FINE NEEDLE ASPIRATION:    FINAL MICROSCOPIC DIAGNOSIS:  - Malignant cells consistent with adenocarcinoma   SPECIMEN ADEQUACY:  Satisfactory for evaluation   IMMEDIATE EVALUATION:  SUFFICIENT, POSITIVE FOR MALIGNANCY CONSISTENT WITH ADENO CA. (MPL)    03/18/2022 Cancer Staging   Staging form: Exocrine Pancreas, AJCC 8th Edition - Clinical stage from 03/18/2022: Stage IV (cT2, cN1, cM1) - Signed by Truitt Merle, MD on 04/02/2022   03/23/2022 Initial Diagnosis   Pancreatic cancer ([place])   04/02/2022 - 04/16/2022 Chemotherapy   Patient is on Treatment Plan : PANCREATIC Abraxane / Gemcitabine D1,15 q28d      Genetic  Testing   [facility] CancerNext-Expanded Panel was Negative. Report date is 04/03/2022.  The CancerNext-Expanded gene panel offered by [facility] and includes sequencing, rearrangement, and RNA analysis for the following 77 genes: AIP, ALK, APC, ATM, AXIN2, BAP1, BARD1, BLM, BMPR1A, BRCA1, BRCA2, BRIP1, CDC73, CDH1, CDK4, CDKN1B, CDKN2A, CHEK2, CTNNA1, DICER1, FANCC, FH, FLCN, GALNT12, KIF1B, LZTR1, MAX, MEN1, MET, MLH1, MSH2, MSH3, MSH6, MUTYH, NBN, NF1, NF2, NTHL1, PALB2, PHOX2B, PMS2, POT1, PRKAR1A, PTCH1, PTEN, RAD51C, RAD51D, RB1, RECQL, RET, SDHA, SDHAF2, SDHB, SDHC, SDHD, SMAD4, SMARCA4, SMARCB1, SMARCE1, STK11, SUFU, TMEM127, TP53, TSC1, TSC2, VHL and XRCC2 (sequencing and deletion/duplication); EGFR, EGLN1, HOXB13, KIT, MITF, PDGFRA, POLD1, and POLE (sequencing only); EPCAM and GREM1 (deletion/duplication only).    04/02/2022 -  Chemotherapy   Patient is on Treatment Plan : PANCREATIC Abraxane D1,8,15 + Gemcitabine D1,8,15 q28d        INTERVAL HISTORY:  Jessica Livingston is here for a follow up of pancreatic cancer. She was last seen by me on 05/14/22. She was seen in the infusion area. She reports she is feeling well today.   All other systems were reviewed with the patient and are negative.  MEDICAL HISTORY:  Past Medical History:  Diagnosis Date   breast cancer 2022   Left   CAD (coronary artery disease)    Cataract 2018   had sx   Depression    Family history of breast cancer    Heart disease '   History of radiation therapy    Left breast 05/21/21-06/18/21- Dr. Gery Pray   HLD (  hyperlipidemia)    Obesity, unspecified    Other abnormal glucose    Other B-complex deficiencies    Other seborrheic keratosis    Pancreatic cancer ([place]) 02/2022   Postmenopausal     SURGICAL HISTORY: Past Surgical History:  Procedure Laterality Date   BREAST LUMPECTOMY WITH RADIOACTIVE SEED LOCALIZATION Left 04/16/2021   Procedure: LEFT BREAST LUMPECTOMY WITH RADIOACTIVE SEED  LOCALIZATION;  Surgeon: Jovita Kussmaul, MD;  Location: [facility];  Service: General;  Laterality: Left;   CARDIAC CATHETERIZATION  5/03   mild to mod single vessel disease   CHOLECYSTECTOMY     COLONOSCOPY  2018   HPP/TA   ESOPHAGOGASTRODUODENOSCOPY (EGD) WITH PROPOFOL N/A 03/18/2022   Procedure: ESOPHAGOGASTRODUODENOSCOPY (EGD) WITH PROPOFOL;  Surgeon: [name], MD;  Location: [facility];  Service: Gastroenterology;  Laterality: N/A;   EUS N/A 03/18/2022   Procedure: ESOPHAGEAL ENDOSCOPIC ULTRASOUND (EUS) RADIAL;  Surgeon: [name], MD;  Location: [facility];  Service: Gastroenterology;  Laterality: N/A;   FINE NEEDLE ASPIRATION N/A 03/18/2022   Procedure: FINE NEEDLE ASPIRATION (FNA) LINEAR;  Surgeon: [name], MD;  Location: [facility];  Service: Gastroenterology;  Laterality: N/A;   IR IMAGING GUIDED PORT INSERTION  03/31/2022   LAPAROSCOPIC GASTRIC BANDING  01/13/2010   NECK SURGERY     POLYPECTOMY  2018   TONSILLECTOMY     TUBAL LIGATION Bilateral     I have reviewed the social history and family history with the patient and they are unchanged from previous note.  ALLERGIES:  is allergic to atorvastatin and rosuvastatin.  MEDICATIONS:  Current Outpatient Medications  Medication Sig Dispense Refill   Besifloxacin HCl (BESIVANCE) 0.6 % SUSP Place 1 drop into both eyes See admin instructions. Instill 1 drop into both eyes 3 times daily once a month on the day of monthly eye injections     Cyanocobalamin (VITAMIN B-12 PO) Take 2 tablets by mouth daily. gummy     HYDROcodone-acetaminophen (NORCO) 10-325 MG tablet Take 1 tablet by mouth every 6 (six) hours as needed for severe pain or moderate pain. 30 tablet 0   lipase/protease/amylase (CREON) 36000 UNITS CPEP capsule Take 1 capsule (36,000 Units total) by mouth 3 (three) times daily with meals AND 1 capsule (36,000 Units total) 3 (three) times daily before meals. 60 capsule 0   omeprazole (PRILOSEC) 20 MG capsule  Take 20 mg by mouth daily.     ondansetron (ZOFRAN-ODT) 4 MG disintegrating tablet Take 1 tablet (4 mg total) by mouth every 8 (eight) hours as needed for nausea or vomiting. 20 tablet 0   prochlorperazine (COMPAZINE) 10 MG tablet Take 1 tablet (10 mg total) by mouth every 6 (six) hours as needed. 30 tablet 2   VITAMIN D PO Take 2 tablets by mouth daily. gummy     No current facility-administered medications for this visit.    PHYSICAL EXAMINATION: ECOG PERFORMANCE STATUS: 1 - Symptomatic but completely ambulatory  There were no vitals filed for this visit. Wt Readings from Last 3 Encounters:  05/28/22 182 lb 8 oz (82.8 kg)  05/14/22 183 lb 14.4 oz (83.4 kg)  04/30/22 187 lb (84.8 kg)     GENERAL:alert, no distress and comfortable SKIN: skin color normal, no rashes or significant lesions EYES: normal, Conjunctiva are pink and non-injected, sclera clear  NEURO: alert & oriented x 3 with fluent speech  LABORATORY DATA:  I have reviewed the data as listed    Latest Ref Rng & Units 05/28/2022  11:10 AM 05/14/2022    9:51 AM 04/30/2022    8:46 AM  CBC  WBC 4.0 - 10.5 K/uL 3.0  3.4  3.2   Hemoglobin 12.0 - 15.0 g/dL 12.2  13.1  13.7   Hematocrit 36.0 - 46.0 % 36.4  39.0  40.2   Platelets 150 - 400 K/uL 191  194  199         Latest Ref Rng & Units 05/28/2022   11:10 AM 05/14/2022    9:51 AM 04/30/2022    8:46 AM  CMP  Glucose 70 - 99 mg/dL 133  184  144   BUN 8 - 23 mg/dL 10  12  10    Creatinine 0.44 - 1.00 mg/dL 0.54  0.57  0.62   Sodium 135 - 145 mmol/L 139  139  138   Potassium 3.5 - 5.1 mmol/L 3.8  3.3  4.0   Chloride 98 - 111 mmol/L 105  104  103   CO2 22 - 32 mmol/L 30  27  29    Calcium 8.9 - 10.3 mg/dL 8.6  8.7  9.2   Total Protein 6.5 - 8.1 g/dL 6.6  6.8  7.0   Total Bilirubin 0.3 - 1.2 mg/dL 0.6  0.7  0.7   Alkaline Phos 38 - 126 U/L 91  75  86   AST 15 - 41 U/L 42  46  37   ALT 0 - 44 U/L 37  47  45       RADIOGRAPHIC STUDIES: I have personally reviewed the  radiological images as listed and agreed with the findings in the report. No results found.    No orders of the defined types were placed in this encounter.  All questions were answered. The patient knows to call the clinic with any problems, questions or concerns. No barriers to learning was detected. The total time spent in the appointment was 30 minutes.     Truitt Merle, MD 05/28/2022   I, Wilburn Mylar, am acting as scribe for Truitt Merle, MD.   I have reviewed the above documentation for accuracy and completeness, and I agree with the above.

## 2022-05-28 NOTE — Progress Notes (Signed)
Per Dr Burr Medico, ok to proceed with treatment today with ANC 1.2 K/uL

## 2022-05-31 ENCOUNTER — Other Ambulatory Visit: Payer: Self-pay

## 2022-06-07 ENCOUNTER — Encounter (INDEPENDENT_AMBULATORY_CARE_PROVIDER_SITE_OTHER): Payer: PPO | Admitting: Ophthalmology

## 2022-06-07 DIAGNOSIS — H353231 Exudative age-related macular degeneration, bilateral, with active choroidal neovascularization: Secondary | ICD-10-CM

## 2022-06-07 DIAGNOSIS — I10 Essential (primary) hypertension: Secondary | ICD-10-CM

## 2022-06-07 DIAGNOSIS — H35033 Hypertensive retinopathy, bilateral: Secondary | ICD-10-CM

## 2022-06-07 DIAGNOSIS — H43813 Vitreous degeneration, bilateral: Secondary | ICD-10-CM

## 2022-06-09 IMAGING — DX DG ABDOMEN 2V
2 series · 2 of 2 positions shown · non-contrast
Comparison: 01/14/2010

CLINICAL DATA: Left upper quadrant pain.

EXAM:
ABDOMEN - 2 VIEW

[abdomen erect ap]
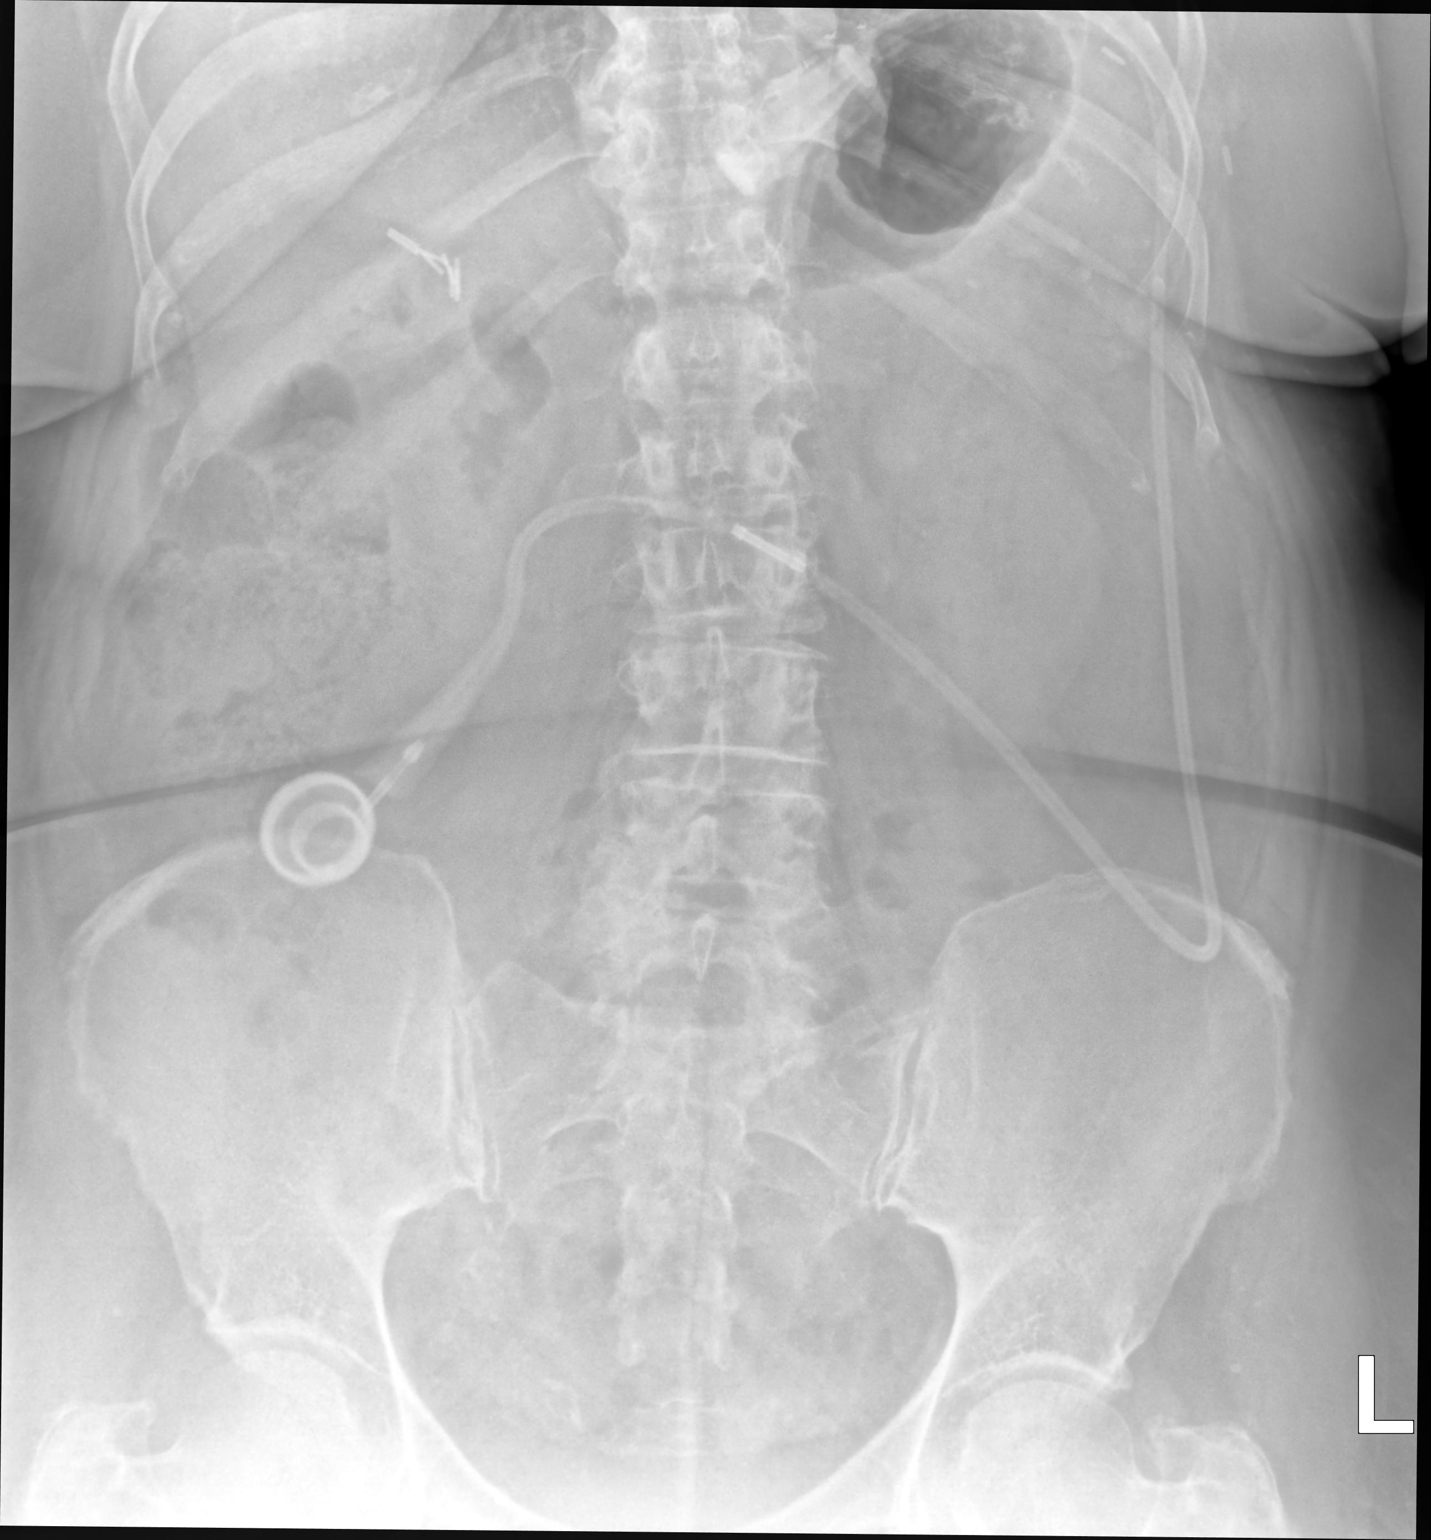

[abdomen supine ap]
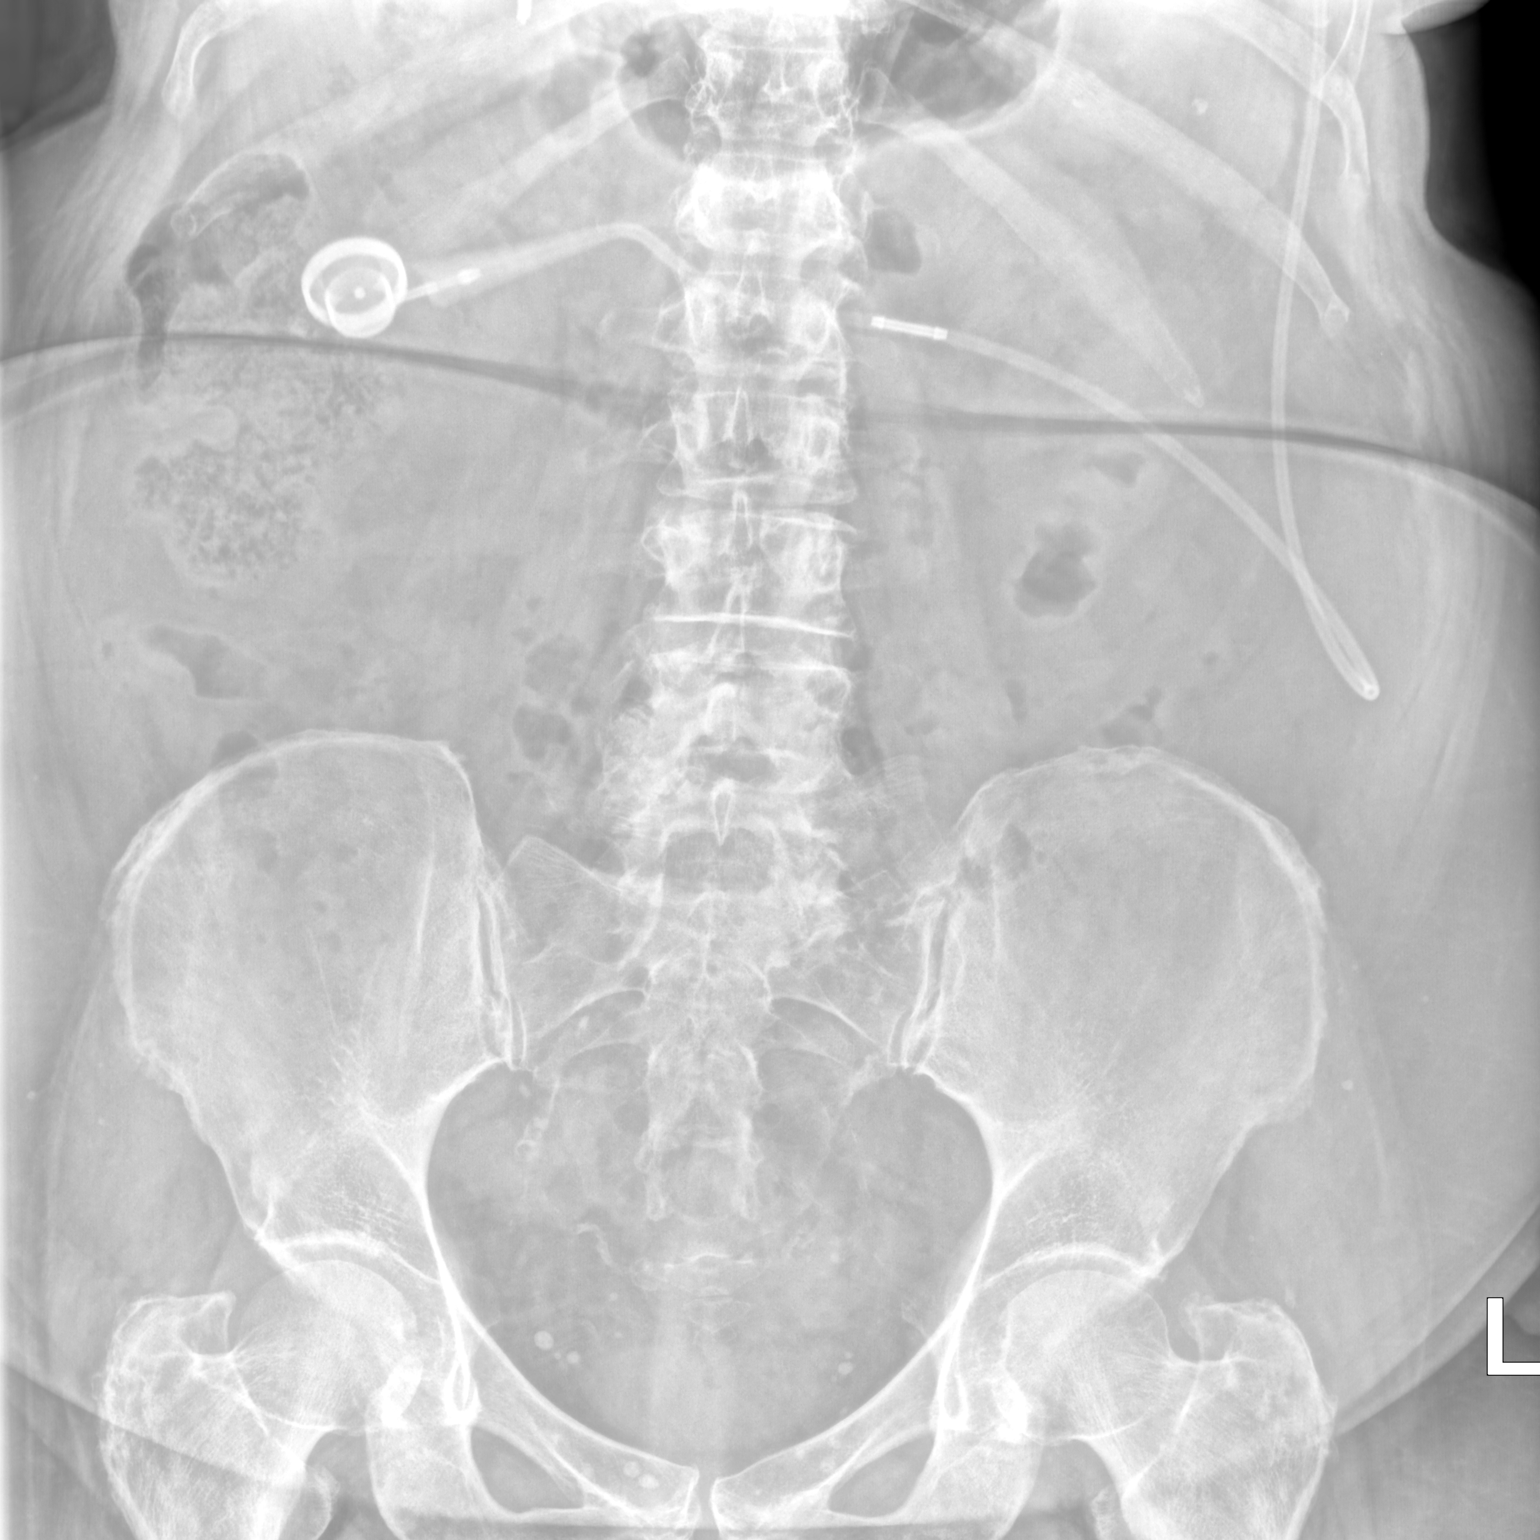

[2 of 2 positions shown; findings below may reference images not displayed]

FINDINGS: Normal bowel gas pattern. Mild stool in the right colon. No urinary
tract calculi.

Surgical clips in the gallbladder fossa. Gastric banding unchanged
in position.
IMPRESSION: Negative.

## 2022-06-11 ENCOUNTER — Inpatient Hospital Stay (HOSPITAL_BASED_OUTPATIENT_CLINIC_OR_DEPARTMENT_OTHER): Payer: PPO | Admitting: Hematology

## 2022-06-11 ENCOUNTER — Other Ambulatory Visit: Payer: Self-pay

## 2022-06-11 ENCOUNTER — Inpatient Hospital Stay: Payer: PPO

## 2022-06-11 ENCOUNTER — Encounter: Payer: Self-pay | Admitting: Hematology

## 2022-06-11 ENCOUNTER — Inpatient Hospital Stay: Payer: PPO | Attending: Hematology

## 2022-06-11 VITALS — BP 131/71 | HR 82 | Temp 98.6°F | Resp 16 | Wt 181.1 lb

## 2022-06-11 VITALS — BP 124/75 | HR 75 | Temp 98.2°F | Resp 16

## 2022-06-11 DIAGNOSIS — C250 Malignant neoplasm of head of pancreas: Secondary | ICD-10-CM

## 2022-06-11 DIAGNOSIS — R197 Diarrhea, unspecified: Secondary | ICD-10-CM | POA: Insufficient documentation

## 2022-06-11 DIAGNOSIS — Z79899 Other long term (current) drug therapy: Secondary | ICD-10-CM | POA: Insufficient documentation

## 2022-06-11 DIAGNOSIS — Z86000 Personal history of in-situ neoplasm of breast: Secondary | ICD-10-CM | POA: Insufficient documentation

## 2022-06-11 DIAGNOSIS — Z923 Personal history of irradiation: Secondary | ICD-10-CM | POA: Insufficient documentation

## 2022-06-11 DIAGNOSIS — Z803 Family history of malignant neoplasm of breast: Secondary | ICD-10-CM | POA: Diagnosis not present

## 2022-06-11 DIAGNOSIS — Z5111 Encounter for antineoplastic chemotherapy: Secondary | ICD-10-CM | POA: Diagnosis not present

## 2022-06-11 DIAGNOSIS — Z95828 Presence of other vascular implants and grafts: Secondary | ICD-10-CM

## 2022-06-11 LAB — CBC WITH DIFFERENTIAL (CANCER CENTER ONLY)
Abs Immature Granulocytes: 0.02 10*3/uL (ref 0.00–0.07)
Basophils Absolute: 0 10*3/uL (ref 0.0–0.1)
Basophils Relative: 1 %
Eosinophils Absolute: 0.1 10*3/uL (ref 0.0–0.5)
Eosinophils Relative: 2 %
HCT: 37.5 % (ref 36.0–46.0)
Hemoglobin: 12.4 g/dL (ref 12.0–15.0)
Immature Granulocytes: 1 %
Lymphocytes Relative: 27 %
Lymphs Abs: 0.9 10*3/uL (ref 0.7–4.0)
MCH: 32.5 pg (ref 26.0–34.0)
MCHC: 33.1 g/dL (ref 30.0–36.0)
MCV: 98.2 fL (ref 80.0–100.0)
Monocytes Absolute: 0.9 10*3/uL (ref 0.1–1.0)
Monocytes Relative: 27 %
Neutro Abs: 1.3 10*3/uL — ABNORMAL LOW (ref 1.7–7.7)
Neutrophils Relative %: 42 %
Platelet Count: 213 10*3/uL (ref 150–400)
RBC: 3.82 MIL/uL — ABNORMAL LOW (ref 3.87–5.11)
RDW: 16.6 % — ABNORMAL HIGH (ref 11.5–15.5)
WBC Count: 3.2 10*3/uL — ABNORMAL LOW (ref 4.0–10.5)
nRBC: 0 % (ref 0.0–0.2)

## 2022-06-11 LAB — CMP (CANCER CENTER ONLY)
ALT: 29 U/L (ref 0–44)
AST: 33 U/L (ref 15–41)
Albumin: 3.8 g/dL (ref 3.5–5.0)
Alkaline Phosphatase: 93 U/L (ref 38–126)
Anion gap: 5 (ref 5–15)
BUN: 10 mg/dL (ref 8–23)
CO2: 29 mmol/L (ref 22–32)
Calcium: 8.7 mg/dL — ABNORMAL LOW (ref 8.9–10.3)
Chloride: 104 mmol/L (ref 98–111)
Creatinine: 0.58 mg/dL (ref 0.44–1.00)
GFR, Estimated: >60 mL/min (ref >60)
Glucose, Bld: 124 mg/dL — ABNORMAL HIGH (ref 70–99)
Potassium: 4.1 mmol/L (ref 3.5–5.1)
Sodium: 138 mmol/L (ref 135–145)
Total Bilirubin: 0.6 mg/dL (ref 0.3–1.2)
Total Protein: 6.7 g/dL (ref 6.5–8.1)

## 2022-06-11 MED ORDER — HEPARIN SOD (PORK) LOCK FLUSH 100 UNIT/ML IV SOLN: 500.0000 [IU] | Freq: Once | INTRAVENOUS | Status: DC

## 2022-06-11 MED ORDER — PROCHLORPERAZINE MALEATE 10 MG PO TABS
10.0000 mg | ORAL_TABLET | Freq: Once | ORAL | Status: AC
  Administered 2022-06-11: 10 mg via ORAL
  Filled 2022-06-11: qty 1

## 2022-06-11 MED ORDER — SODIUM CHLORIDE 0.9 % IV SOLN
1000.0000 mg/m2 | Freq: Once | INTRAVENOUS | Status: AC
  Administered 2022-06-11: 1938 mg via INTRAVENOUS
  Filled 2022-06-11: qty 50.97

## 2022-06-11 MED ORDER — SODIUM CHLORIDE 0.9% FLUSH
10.0000 mL | Freq: Once | INTRAVENOUS | Status: AC
  Administered 2022-06-11: 10 mL

## 2022-06-11 MED ORDER — HEPARIN SOD (PORK) LOCK FLUSH 100 UNIT/ML IV SOLN
500.0000 [IU] | Freq: Once | INTRAVENOUS | Status: AC | PRN
  Administered 2022-06-11: 500 [IU]

## 2022-06-11 MED ORDER — SODIUM CHLORIDE 0.9% FLUSH
10.0000 mL | INTRAVENOUS | Status: DC | PRN
  Administered 2022-06-11: 10 mL

## 2022-06-11 MED ORDER — PACLITAXEL PROTEIN-BOUND CHEMO INJECTION 100 MG
100.0000 mg/m2 | Freq: Once | INTRAVENOUS | Status: AC
  Administered 2022-06-11: 200 mg via INTRAVENOUS
  Filled 2022-06-11: qty 40

## 2022-06-11 MED ORDER — SODIUM CHLORIDE 0.9 % IV SOLN: Freq: Once | INTRAVENOUS | Status: AC

## 2022-06-11 MED ORDER — ONDANSETRON HCL 8 MG PO TABS: 8.0000 mg | ORAL_TABLET | Freq: Three times a day (TID) | ORAL | 1 refills | Status: DC | PRN

## 2022-06-11 NOTE — Progress Notes (Signed)
[place]   Telephone:([phone]) [phone] Fax:(336) [phone]   Clinic Follow up Note   Patient Care Team: Deland Pretty, MD as PCP - General (Internal Medicine) Mauro Kaufmann, RN as Oncology Nurse Navigator Rockwell Germany, RN as Oncology Nurse Navigator Jovita Kussmaul, MD as Consulting Physician (General Surgery) Nicholas Lose, MD as Consulting Physician (Hematology and Oncology) Gery Pray, MD as Consulting Physician (Radiation Oncology) Delice Bison, Charlestine Massed, NP as Nurse Practitioner (Hematology and Oncology) Harmon Pier, RN as Registered Nurse Truitt Merle, MD as Consulting Physician (Oncology) Royston Bake, RN as Oncology Nurse Navigator (Oncology)  Date of Service:  06/11/2022  CHIEF COMPLAINT: f/u of pancreatic cancer  CURRENT THERAPY:  First line Abraxane/Gemcitabine, q14d, starting 04/02/22  ASSESSMENT & PLAN:  Jessica Livingston is a 81 y.o. female with   1. Pancreatic adenocarcinoma, cT2N1Mx, with indeterminate lung nodules -presented with abdominal discomfort, back pain, and nausea/vomiting. Abdomen CT scan on 03/04/22 showed pancreatic head mass with venous encasement.   -baseline CA 19.9 1,715, AFP 9.2 on 03/09/22 -EUS/FNA on 03/18/22 showed 3.4 cm mass in uncinate/head of the pancreas, surrounding portal vein, and causing mild biliary obstruction. Cytology confirmed adenocarcinoma. -staging chest CT on 03/30/22 showed four subcentimeter pulmonary nodules in both lungs, with largest 9 mm, no thoracic adenopathy. -she met Dr. Zenia Resides on 04/12/22, plan for reassessment after chemo, but unlikely will offer with both surgery due to her advanced age and indeterminate lung nodules. -she began abraxane/gemcitabine every 2 weeks on 04/02/22. She tolerates very well with only fatigue. She also reports resolution of her pains, which indicating good clinical response  -she is scheduled for restaging CT on 07/01/22. We reviewed her treatment plan. 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 I explained she is still at high risk for recurrence even if she proceeds with surgery. She has options including continuing gem/abraxane, radiation, and/or moving to gemcitabine alone. I discussed that we will await the results of the scan to determine the next best course of action.  -we also discussed the balance between disease control and quality of life. The fatigue she has is accumulating with each cycle, as expected, and is getting harder on her. -she notes she is not taking the creon due to cost and endorses doing well. Labs reviewed, ANC stable at 1.3. Adequate for cycle 6 today at reduced dose due to fatigue.    2. H/o Left breast DCIS, ER+/PR+, grade 3 -diagnosed in 01/2021. S/p lumpectomy 04/16/21, path showed DCIS. Treated with adjuvant radiation 05/22/21 - 06/18/21. She declined antiestrogen therapy.   3. Genetics -she reports breast cancer in her mother at age 85. -blood test obtained 03/23/22, results were negative. -The patient has 2 living children     PLAN: -proceed with 6th gem/abraxane today with dose reduction on Abraxane due to her fatigue. -lab, flush, f/u, and gem/abraxane 10/27 -restaging CT 11/2, will reschedule to a few days before 10/27   No problem-specific Assessment & Plan notes found for this encounter.   SUMMARY OF ONCOLOGIC HISTORY: Oncology History  Ductal carcinoma in situ (DCIS) of left breast  02/17/2021 Initial Diagnosis   Screening mammogram detected left breast calcifications lower inner quadrant 0.9 cm: Biopsy revealed high-grade DCIS with necrosis and calcifications, ER 30%, PR 50% weak   02/25/2021 Cancer Staging   Staging form: Breast, AJCC 8th Edition - Clinical stage from 02/25/2021: Stage 0 (cTis (DCIS), cN0, cM0, G3, ER+, PR+,  HER2: Not Assessed) - Signed by Nicholas Lose, MD on 02/25/2021 Stage prefix: Initial diagnosis Histologic grading system: 3 grade system   04/16/2021  Cancer Staging   Staging form: Breast, AJCC 8th Edition - Pathologic stage from 04/16/2021: Stage 0 (pTis (DCIS), pN0, cM0, ER+, PR+) - Signed by Gardenia Phlegm, NP on 09/04/2021 Stage prefix: Initial diagnosis Nuclear grade: G3   04/16/2021 Surgery   Left breast lumpectomy with radioactive seed localization   05/21/2021 - 06/18/2021 Radiation Therapy    Radiation Treatment Dates: 05/21/2021 through 06/18/2021 Site Technique Total Dose (Gy) Dose per Fx (Gy) Completed Fx Beam Energies  Breast, Left: Breast_Lt 3D 40.05/40.05 2.67 15/15 6X, 10X  Breast, Left: Breast_Lt_Bst 3D 10/10 2 5/5 6X    Pancreatic cancer ([place])  03/04/2022 Imaging   CT ABDOMEN W CONTRAST   IMPRESSION: 1. Findings highly suspicious for adenocarcinoma in the pancreatic head with resultant upstream atrophy and duct dilatation. Venous encasement as detailed above. No arterial involvement. 2. Suspect cirrhosis and mild steatosis. 3. Coronary artery atherosclerosis. Aortic Atherosclerosis (ICD10-I70.0).   03/09/2022 Tumor Marker   Patient's tumor was tested for the following markers: CA-19.9. Results of the tumor marker test revealed 1715.   03/18/2022 Procedure   EUS-Dr. Ardis Hughs  1. Irregularly shaped, indistinctly bordered, heterogeneous, hypoechoic mass that measures 3.4 cm maximally in the uncinate/head of pancreas. The mass clearly surrounds and attenuates the portal vein near the splenic vein, SMV confluence. The mass is causing mild biliary duct obstruction with the common bile duct measuring 8.6 mm. The mass is also obstructing and dilating the main pancreatic duct which measures up to 9 millimeters in the body and tail. I used a 25-gauge EUS FNB needle to sample the lesion with a transduodenal approach, 2 passes. 2. No peripancreatic adenopathy 3. Limited views of the liver, spleen were normal   03/18/2022 Pathology Results   CYTOLOGY - NON PAP  CASE: [id number]  PATIENT: Jessica Livingston   Non-Gynecological Cytology Report   Clinical History: Cirrhosis, pancreatic mass  Specimen Submitted:  A. PANCREAS, HEAD, FINE NEEDLE ASPIRATION:    FINAL MICROSCOPIC DIAGNOSIS:  - Malignant cells consistent with adenocarcinoma   SPECIMEN ADEQUACY:  Satisfactory for evaluation   IMMEDIATE EVALUATION:  SUFFICIENT, POSITIVE FOR MALIGNANCY CONSISTENT WITH ADENO CA. (MPL)    03/18/2022 Cancer Staging   Staging form: Exocrine Pancreas, AJCC 8th Edition - Clinical stage from 03/18/2022: Stage IV (cT2, cN1, cM1) - Signed by Truitt Merle, MD on 04/02/2022   03/23/2022 Initial Diagnosis   Pancreatic cancer ([place])   04/02/2022 - 04/16/2022 Chemotherapy   Patient is on Treatment Plan : PANCREATIC Abraxane / Gemcitabine D1,15 q28d      Genetic Testing   [facility] CancerNext-Expanded Panel was Negative. Report date is 04/03/2022.  The CancerNext-Expanded gene panel offered by [facility] and includes sequencing, rearrangement, and RNA analysis for the following 77 genes: AIP, ALK, APC, ATM, AXIN2, BAP1, BARD1, BLM, BMPR1A, BRCA1, BRCA2, BRIP1, CDC73, CDH1, CDK4, CDKN1B, CDKN2A, CHEK2, CTNNA1, DICER1, FANCC, FH, FLCN, GALNT12, KIF1B, LZTR1, MAX, MEN1, MET, MLH1, MSH2, MSH3, MSH6, MUTYH, NBN, NF1, NF2, NTHL1, PALB2, PHOX2B, PMS2, POT1, PRKAR1A, PTCH1, PTEN, RAD51C, RAD51D, RB1, RECQL, RET, SDHA, SDHAF2, SDHB, SDHC, SDHD, SMAD4, SMARCA4, SMARCB1, SMARCE1, STK11, SUFU, TMEM127, TP53, TSC1, TSC2, VHL and XRCC2 (sequencing and deletion/duplication); EGFR, EGLN1, HOXB13, KIT, MITF, PDGFRA, POLD1, and POLE (sequencing only); EPCAM and GREM1 (deletion/duplication only).    04/02/2022 -  Chemotherapy   Patient is on Treatment Plan : PANCREATIC Abraxane D1,8,15 + Gemcitabine  D1,8,15 q28d        INTERVAL HISTORY:  Jessica Livingston is here for a follow up of pancreatic cancer. She was last seen by me on 05/28/22. She presents to the clinic alone. She reports she has about 5 days of not feeling well then recovers  during the second week.   All other systems were reviewed with the patient and are negative.  MEDICAL HISTORY:  Past Medical History:  Diagnosis Date   breast cancer 2022   Left   CAD (coronary artery disease)    Cataract 2018   had sx   Depression    Family history of breast cancer    Heart disease '   History of radiation therapy    Left breast 05/21/21-06/18/21- Dr. Gery Pray   HLD (hyperlipidemia)    Obesity, unspecified    Other abnormal glucose    Other B-complex deficiencies    Other seborrheic keratosis    Pancreatic cancer ([place]) 02/2022   Postmenopausal     SURGICAL HISTORY: Past Surgical History:  Procedure Laterality Date   BREAST LUMPECTOMY WITH RADIOACTIVE SEED LOCALIZATION Left 04/16/2021   Procedure: LEFT BREAST LUMPECTOMY WITH RADIOACTIVE SEED LOCALIZATION;  Surgeon: Jovita Kussmaul, MD;  Location: [facility];  Service: General;  Laterality: Left;   CARDIAC CATHETERIZATION  5/03   mild to mod single vessel disease   CHOLECYSTECTOMY     COLONOSCOPY  2018   HPP/TA   ESOPHAGOGASTRODUODENOSCOPY (EGD) WITH PROPOFOL N/A 03/18/2022   Procedure: ESOPHAGOGASTRODUODENOSCOPY (EGD) WITH PROPOFOL;  Surgeon: [name], MD;  Location: [facility];  Service: Gastroenterology;  Laterality: N/A;   EUS N/A 03/18/2022   Procedure: ESOPHAGEAL ENDOSCOPIC ULTRASOUND (EUS) RADIAL;  Surgeon: [name], MD;  Location: [facility];  Service: Gastroenterology;  Laterality: N/A;   FINE NEEDLE ASPIRATION N/A 03/18/2022   Procedure: FINE NEEDLE ASPIRATION (FNA) LINEAR;  Surgeon: [name], MD;  Location: [facility];  Service: Gastroenterology;  Laterality: N/A;   IR IMAGING GUIDED PORT INSERTION  03/31/2022   LAPAROSCOPIC GASTRIC BANDING  01/13/2010   NECK SURGERY     POLYPECTOMY  2018   TONSILLECTOMY     TUBAL LIGATION Bilateral     I have reviewed the social history and family history with the patient and they are unchanged from previous note.  ALLERGIES:  is  allergic to atorvastatin and rosuvastatin.  MEDICATIONS:  Current Outpatient Medications  Medication Sig Dispense Refill   ondansetron (ZOFRAN) 8 MG tablet Take 1 tablet (8 mg total) by mouth every 8 (eight) hours as needed for nausea or vomiting. 20 tablet 1   Besifloxacin HCl (BESIVANCE) 0.6 % SUSP Place 1 drop into both eyes See admin instructions. Instill 1 drop into both eyes 3 times daily once a month on the day of monthly eye injections     Cyanocobalamin (VITAMIN B-12 PO) Take 2 tablets by mouth daily. gummy     HYDROcodone-acetaminophen (NORCO) 10-325 MG tablet Take 1 tablet by mouth every 6 (six) hours as needed for severe pain or moderate pain. 30 tablet 0   lipase/protease/amylase (CREON) 36000 UNITS CPEP capsule Take 1 capsule (36,000 Units total) by mouth 3 (three) times daily with meals AND 1 capsule (36,000 Units total) 3 (three) times daily before meals. 60 capsule 0   omeprazole (PRILOSEC) 20 MG capsule Take 20 mg by mouth daily.     ondansetron (ZOFRAN-ODT) 4 MG disintegrating tablet Take 1 tablet (4 mg total) by mouth every 8 (eight) hours as  needed for nausea or vomiting. 20 tablet 0   prochlorperazine (COMPAZINE) 10 MG tablet Take 1 tablet (10 mg total) by mouth every 6 (six) hours as needed. 30 tablet 2   VITAMIN D PO Take 2 tablets by mouth daily. gummy     No current facility-administered medications for this visit.   Facility-Administered Medications Ordered in Other Visits  Medication Dose Route Frequency Provider Last Rate Last Admin   sodium chloride flush (NS) 0.9 % injection 10 mL  10 mL Intracatheter PRN Truitt Merle, MD   10 mL at 06/11/22 1523    PHYSICAL EXAMINATION: ECOG PERFORMANCE STATUS: 2 - Symptomatic, <50% confined to bed  Vitals:   06/11/22 1225  BP: 131/71  Pulse: 82  Resp: 16  Temp: 98.6 F (37 C)  SpO2: 96%   Wt Readings from Last 3 Encounters:  06/11/22 181 lb 1.6 oz (82.1 kg)  05/28/22 182 lb 8 oz (82.8 kg)  05/14/22 183 lb 14.4 oz  (83.4 kg)     GENERAL:alert, no distress and comfortable SKIN: skin color normal, no rashes or significant lesions EYES: normal, Conjunctiva are pink and non-injected, sclera clear  NEURO: alert & oriented x 3 with fluent speech  LABORATORY DATA:  I have reviewed the data as listed    Latest Ref Rng & Units 06/11/2022   12:07 PM 05/28/2022   11:10 AM 05/14/2022    9:51 AM  CBC  WBC 4.0 - 10.5 K/uL 3.2  3.0  3.4   Hemoglobin 12.0 - 15.0 g/dL 12.4  12.2  13.1   Hematocrit 36.0 - 46.0 % 37.5  36.4  39.0   Platelets 150 - 400 K/uL 213  191  194         Latest Ref Rng & Units 06/11/2022   12:07 PM 05/28/2022   11:10 AM 05/14/2022    9:51 AM  CMP  Glucose 70 - 99 mg/dL 124  133  184   BUN 8 - 23 mg/dL 10  10  12    Creatinine 0.44 - 1.00 mg/dL 0.58  0.54  0.57   Sodium 135 - 145 mmol/L 138  139  139   Potassium 3.5 - 5.1 mmol/L 4.1  3.8  3.3   Chloride 98 - 111 mmol/L 104  105  104   CO2 22 - 32 mmol/L 29  30  27    Calcium 8.9 - 10.3 mg/dL 8.7  8.6  8.7   Total Protein 6.5 - 8.1 g/dL 6.7  6.6  6.8   Total Bilirubin 0.3 - 1.2 mg/dL 0.6  0.6  0.7   Alkaline Phos 38 - 126 U/L 93  91  75   AST 15 - 41 U/L 33  42  46   ALT 0 - 44 U/L 29  37  47       RADIOGRAPHIC STUDIES: I have personally reviewed the radiological images as listed and agreed with the findings in the report. No results found.    No orders of the defined types were placed in this encounter.  All questions were answered. The patient knows to call the clinic with any problems, questions or concerns. No barriers to learning was detected. The total time spent in the appointment was 30 minutes.     Truitt Merle, MD 06/11/2022   I, Wilburn Mylar, am acting as scribe for Truitt Merle, MD.   I have reviewed the above documentation for accuracy and completeness, and I agree with the above.

## 2022-06-12 LAB — CANCER ANTIGEN 19-9: CA 19-9: 283 U/mL — ABNORMAL HIGH (ref 0–35)

## 2022-06-16 ENCOUNTER — Telehealth: Payer: Self-pay

## 2022-06-16 NOTE — Telephone Encounter (Signed)
Spoke with pt via telephone regarding pt's new CT Scans appointment time.  Informed pt that the CT Scans are no longer scheduled on 11/02 but are now scheduled on 06/24/2022 at 12pm with an arrival time of 11:30am here at [facility].  Informed pt NPO 4hrs prior to procedure.  Pt verbalized understanding and had no further questions or concerns.

## 2022-06-24 ENCOUNTER — Ambulatory Visit (HOSPITAL_COMMUNITY)
Admission: RE | Admit: 2022-06-24 | Discharge: 2022-06-24 | Disposition: A | Discharge status: Home / Self Care | Payer: PPO | Source: Ambulatory Visit | Attending: Hematology | Admitting: Hematology

## 2022-06-24 DIAGNOSIS — J9811 Atelectasis: Secondary | ICD-10-CM | POA: Diagnosis not present

## 2022-06-24 DIAGNOSIS — C250 Malignant neoplasm of head of pancreas: Secondary | ICD-10-CM | POA: Diagnosis not present

## 2022-06-24 DIAGNOSIS — C259 Malignant neoplasm of pancreas, unspecified: Secondary | ICD-10-CM | POA: Diagnosis not present

## 2022-06-24 DIAGNOSIS — N3289 Other specified disorders of bladder: Secondary | ICD-10-CM | POA: Diagnosis not present

## 2022-06-24 MED ORDER — IOHEXOL 300 MG/ML  SOLN
100.0000 mL | Freq: Once | INTRAMUSCULAR | Status: AC | PRN
  Administered 2022-06-24: 100 mL via INTRAVENOUS

## 2022-06-25 ENCOUNTER — Inpatient Hospital Stay (HOSPITAL_BASED_OUTPATIENT_CLINIC_OR_DEPARTMENT_OTHER): Payer: PPO | Admitting: Hematology

## 2022-06-25 ENCOUNTER — Other Ambulatory Visit (HOSPITAL_COMMUNITY): Payer: PPO

## 2022-06-25 ENCOUNTER — Inpatient Hospital Stay: Payer: PPO

## 2022-06-25 ENCOUNTER — Other Ambulatory Visit: Payer: Self-pay

## 2022-06-25 ENCOUNTER — Encounter: Payer: Self-pay | Admitting: Hematology

## 2022-06-25 VITALS — BP 144/79 | HR 86 | Temp 98.4°F | Resp 16 | Ht 63.0 in | Wt 182.1 lb

## 2022-06-25 DIAGNOSIS — C250 Malignant neoplasm of head of pancreas: Secondary | ICD-10-CM

## 2022-06-25 DIAGNOSIS — Z95828 Presence of other vascular implants and grafts: Secondary | ICD-10-CM

## 2022-06-25 DIAGNOSIS — Z5111 Encounter for antineoplastic chemotherapy: Secondary | ICD-10-CM | POA: Diagnosis not present

## 2022-06-25 LAB — CBC WITH DIFFERENTIAL (CANCER CENTER ONLY)
Abs Immature Granulocytes: 0.02 10*3/uL (ref 0.00–0.07)
Basophils Absolute: 0 10*3/uL (ref 0.0–0.1)
Basophils Relative: 1 %
Eosinophils Absolute: 0.1 10*3/uL (ref 0.0–0.5)
Eosinophils Relative: 3 %
HCT: 34.9 % — ABNORMAL LOW (ref 36.0–46.0)
Hemoglobin: 11.7 g/dL — ABNORMAL LOW (ref 12.0–15.0)
Immature Granulocytes: 1 %
Lymphocytes Relative: 23 %
Lymphs Abs: 0.9 10*3/uL (ref 0.7–4.0)
MCH: 33.2 pg (ref 26.0–34.0)
MCHC: 33.5 g/dL (ref 30.0–36.0)
MCV: 99.1 fL (ref 80.0–100.0)
Monocytes Absolute: 0.9 10*3/uL (ref 0.1–1.0)
Monocytes Relative: 24 %
Neutro Abs: 1.8 10*3/uL (ref 1.7–7.7)
Neutrophils Relative %: 48 %
Platelet Count: 209 10*3/uL (ref 150–400)
RBC: 3.52 MIL/uL — ABNORMAL LOW (ref 3.87–5.11)
RDW: 16.2 % — ABNORMAL HIGH (ref 11.5–15.5)
WBC Count: 3.7 10*3/uL — ABNORMAL LOW (ref 4.0–10.5)
nRBC: 0 % (ref 0.0–0.2)

## 2022-06-25 LAB — CMP (CANCER CENTER ONLY)
ALT: 21 U/L (ref 0–44)
AST: 29 U/L (ref 15–41)
Albumin: 3.5 g/dL (ref 3.5–5.0)
Alkaline Phosphatase: 90 U/L (ref 38–126)
Anion gap: 4 — ABNORMAL LOW (ref 5–15)
BUN: 7 mg/dL — ABNORMAL LOW (ref 8–23)
CO2: 30 mmol/L (ref 22–32)
Calcium: 8.5 mg/dL — ABNORMAL LOW (ref 8.9–10.3)
Chloride: 104 mmol/L (ref 98–111)
Creatinine: 0.54 mg/dL (ref 0.44–1.00)
GFR, Estimated: >60 mL/min (ref >60)
Glucose, Bld: 149 mg/dL — ABNORMAL HIGH (ref 70–99)
Potassium: 4.1 mmol/L (ref 3.5–5.1)
Sodium: 138 mmol/L (ref 135–145)
Total Bilirubin: 0.6 mg/dL (ref 0.3–1.2)
Total Protein: 6.4 g/dL — ABNORMAL LOW (ref 6.5–8.1)

## 2022-06-25 MED ORDER — SODIUM CHLORIDE 0.9 % IV SOLN
800.0000 mg/m2 | Freq: Once | INTRAVENOUS | Status: AC
  Administered 2022-06-25: 1558 mg via INTRAVENOUS
  Filled 2022-06-25: qty 40.98

## 2022-06-25 MED ORDER — PACLITAXEL PROTEIN-BOUND CHEMO INJECTION 100 MG
100.0000 mg/m2 | Freq: Once | INTRAVENOUS | Status: AC
  Administered 2022-06-25: 200 mg via INTRAVENOUS
  Filled 2022-06-25: qty 40

## 2022-06-25 MED ORDER — SODIUM CHLORIDE 0.9% FLUSH
10.0000 mL | INTRAVENOUS | Status: DC | PRN
  Administered 2022-06-25: 10 mL

## 2022-06-25 MED ORDER — HEPARIN SOD (PORK) LOCK FLUSH 100 UNIT/ML IV SOLN
500.0000 [IU] | Freq: Once | INTRAVENOUS | Status: AC | PRN
  Administered 2022-06-25: 500 [IU]

## 2022-06-25 MED ORDER — SODIUM CHLORIDE 0.9% FLUSH
10.0000 mL | Freq: Once | INTRAVENOUS | Status: AC
  Administered 2022-06-25: 10 mL

## 2022-06-25 MED ORDER — SODIUM CHLORIDE 0.9 % IV SOLN: Freq: Once | INTRAVENOUS | Status: AC

## 2022-06-25 MED ORDER — PROCHLORPERAZINE MALEATE 10 MG PO TABS
10.0000 mg | ORAL_TABLET | Freq: Once | ORAL | Status: AC
  Administered 2022-06-25: 10 mg via ORAL
  Filled 2022-06-25: qty 1

## 2022-06-25 NOTE — Patient Instructions (Signed)
[facility] ONCOLOGY  Discharge Instructions: Thank you for choosing [facility] to provide your oncology and hematology care.   If you have a lab appointment with the [facility], please go directly to the [facility] and check in at the registration area.   Wear comfortable clothing and clothing appropriate for easy access to any Portacath or PICC line.   We strive to give you quality time with your provider. You may need to reschedule your appointment if you arrive late (15 or more minutes).  Arriving late affects you and other patients whose appointments are after yours.  Also, if you miss three or more appointments without notifying the office, you may be dismissed from the clinic at the provider's discretion.      For prescription refill requests, have your pharmacy contact our office and allow 72 hours for refills to be completed.    Today you received the following chemotherapy and/or immunotherapy agents: Abraxane/Gemzar     To help prevent nausea and vomiting after your treatment, we encourage you to take your nausea medication as directed.  BELOW ARE SYMPTOMS THAT SHOULD BE REPORTED IMMEDIATELY: *FEVER GREATER THAN 100.4 F (38 C) OR HIGHER *CHILLS OR SWEATING *NAUSEA AND VOMITING THAT IS NOT CONTROLLED WITH YOUR NAUSEA MEDICATION *UNUSUAL SHORTNESS OF BREATH *UNUSUAL BRUISING OR BLEEDING *URINARY PROBLEMS (pain or burning when urinating, or frequent urination) *BOWEL PROBLEMS (unusual diarrhea, constipation, pain near the anus) TENDERNESS IN MOUTH AND THROAT WITH OR WITHOUT PRESENCE OF ULCERS (sore throat, sores in mouth, or a toothache) UNUSUAL RASH, SWELLING OR PAIN  UNUSUAL VAGINAL DISCHARGE OR ITCHING   Items with * indicate a potential emergency and should be followed up as soon as possible or go to the Emergency Department if any problems should occur.  Please show the CHEMOTHERAPY ALERT CARD or IMMUNOTHERAPY ALERT CARD at  check-in to the Emergency Department and triage nurse.  Should you have questions after your visit or need to cancel or reschedule your appointment, please contact [facility]  Dept: [phone]  and follow the prompts.  Office hours are 8:00 a.m. to 4:30 p.m. Monday - Friday. Please note that voicemails left after 4:00 p.m. may not be returned until the following business day.  We are closed weekends and major holidays. You have access to a nurse at all times for urgent questions. Please call the main number to the clinic Dept: [phone] and follow the prompts.   For any non-urgent questions, you may also contact your provider using MyChart. We now offer e-Visits for anyone 68 and older to request care online for non-urgent symptoms. For details visit [email or web address].   Also download the MyChart app! Go to the app store, search "MyChart", open the app, select [facility], and log in with your MyChart username and password.  Masks are optional in the cancer centers. If you would like for your care team to wear a mask while they are taking care of you, please let them know. You may have one support person who is at least 81 years old accompany you for your appointments.

## 2022-06-25 NOTE — Progress Notes (Addendum)
[facility]   [phone]) [phone] [phone]) [phone]   Clinic Follow up Note   Patient Care Team: Deland Pretty, MD as PCP - General (Internal Medicine) Mauro Kaufmann, RN as Oncology Nurse Navigator Rockwell Germany, RN as Oncology Nurse Navigator Jovita Kussmaul, MD as Consulting Physician (General Surgery) Nicholas Lose, MD as Consulting Physician (Hematology and Oncology) Gery Pray, MD as Consulting Physician (Radiation Oncology) Delice Bison, Charlestine Massed, NP as Nurse Practitioner (Hematology and Oncology) Harmon Pier, RN as Registered Nurse Truitt Merle, MD as Consulting Physician (Oncology) Royston Bake, RN as Oncology Nurse Navigator (Oncology)  Date of Service:  06/25/2022  CHIEF COMPLAINT: f/u of pancreatic cancer  CURRENT THERAPY:  First line Abraxane/Gemcitabine, q14d, starting 04/02/22  ASSESSMENT & PLAN:  Jessica Livingston is a 81 y.o. female with   1. Pancreatic adenocarcinoma, cT2N1Mx, with indeterminate lung nodules -presented with abdominal discomfort, back pain, and nausea/vomiting. Abdomen CT scan on 03/04/22 showed pancreatic head mass with venous encasement.   -baseline CA 19.9 1,715, AFP 9.2 on 03/09/22 -EUS/FNA on 03/18/22 showed 3.4 cm mass in uncinate/head of the pancreas, surrounding portal vein, and causing mild biliary obstruction. Cytology confirmed adenocarcinoma. -staging chest CT on 03/30/22 showed four subcentimeter pulmonary nodules in both lungs, with largest 9 mm, no thoracic adenopathy. -she met Dr. Zenia Resides on 04/12/22, plan for reassessment after chemo, but unlikely will offer with both surgery due to her advanced age and indeterminate lung nodules. -she began abraxane/gemcitabine every 2 weeks on 04/02/22. She tolerates very well with only fatigue. She also reports resolution of her pains, which indicating good clinical response  -she underwent restaging CT CAP yesterday, 10/26. I personally reviewed the images with her. We obtained the  official read from radiology during her appointment, and I reviewed them with her. -we again discussed surgery. She previously met with Dr. Zenia Resides, who did not feel comfortable performing surgery. She asked if there were other options, and I'm happy to refer her to surgeon at [facility] or [facility] for discussion. I explained that even if she finds someone to perform surgery, it would still be a big and involved procedure given the SMV/SMA involvement. We again reviewed her options including continuing gem/abraxane, moving to radiation, or moving to maintenance gemcitabine. I explained that even if she completes radiation, she still may need to restart chemo if she has cancer progression. My recommendation at this point is to continue the gem/abraxane for up to 3 months, then move to radiation. I will also give her extra week breaks for the upcoming holidays. The option of referral for surgical opinion remains, and I encouraged her to let me know if she is interested in having a discussion. -we had a long conversation about the balance between disease control and quality of life. I will decrease her chemo dose to see if this helps her recover sooner. -labs reviewed, ANC improved to 1.8, hgb down slightly to 11.7. Adequate for cycle 6 today at reduced dose due to fatigue.   2. Symptom Management: Diarrhea -she was previously prescribed creon, which she is currently not taking due to cost. -we reviewed use of imodium, and I encouraged her to titrate her dose as needed.   2. H/o Left breast DCIS, ER+/PR+, grade 3 -diagnosed in 01/2021. S/p lumpectomy 04/16/21, path showed DCIS. Treated with adjuvant radiation 05/22/21 - 06/18/21. She declined antiestrogen therapy.   3. Genetics -she reports breast cancer in her mother at age 38. -blood test obtained 03/23/22, results  were negative. -The patient has 2 living children     PLAN: -proceed with C4D1 gem/abraxane today with same reduced Abraxane and dose reduced  gemcitabine -lab, flush, f/u, and gem/abraxane in 2 and 5 weeks -We will discuss case in tumor board next week, if no surgery, will refer her to radiation oncology for consolidation radiation in 2 to 3 months   No problem-specific Assessment & Plan notes found for this encounter.   SUMMARY OF ONCOLOGIC HISTORY: Oncology History  Ductal carcinoma in situ (DCIS) of left breast  02/17/2021 Initial Diagnosis   Screening mammogram detected left breast calcifications lower inner quadrant 0.9 cm: Biopsy revealed high-grade DCIS with necrosis and calcifications, ER 30%, PR 50% weak   02/25/2021 Cancer Staging   Staging form: Breast, AJCC 8th Edition - Clinical stage from 02/25/2021: Stage 0 (cTis (DCIS), cN0, cM0, G3, ER+, PR+, HER2: Not Assessed) - Signed by Nicholas Lose, MD on 02/25/2021 Stage prefix: Initial diagnosis Histologic grading system: 3 grade system   04/16/2021 Cancer Staging   Staging form: Breast, AJCC 8th Edition - Pathologic stage from 04/16/2021: Stage 0 (pTis (DCIS), pN0, cM0, ER+, PR+) - Signed by Gardenia Phlegm, NP on 09/04/2021 Stage prefix: Initial diagnosis Nuclear grade: G3   04/16/2021 Surgery   Left breast lumpectomy with radioactive seed localization   05/21/2021 - 06/18/2021 Radiation Therapy    Radiation Treatment Dates: 05/21/2021 through 06/18/2021 Site Technique Total Dose (Gy) Dose per Fx (Gy) Completed Fx Beam Energies  Breast, Left: Breast_Lt 3D 40.05/40.05 2.67 15/15 6X, 10X  Breast, Left: Breast_Lt_Bst 3D 10/10 2 5/5 6X    Pancreatic cancer ([place])  03/04/2022 Imaging   CT ABDOMEN W CONTRAST   IMPRESSION: 1. Findings highly suspicious for adenocarcinoma in the pancreatic head with resultant upstream atrophy and duct dilatation. Venous encasement as detailed above. No arterial involvement. 2. Suspect cirrhosis and mild steatosis. 3. Coronary artery atherosclerosis. Aortic Atherosclerosis (ICD10-I70.0).   03/09/2022 Tumor Marker   Patient's  tumor was tested for the following markers: CA-19.9. Results of the tumor marker test revealed 1715.   03/18/2022 Procedure   EUS-Dr. Ardis Hughs  1. Irregularly shaped, indistinctly bordered, heterogeneous, hypoechoic mass that measures 3.4 cm maximally in the uncinate/head of pancreas. The mass clearly surrounds and attenuates the portal vein near the splenic vein, SMV confluence. The mass is causing mild biliary duct obstruction with the common bile duct measuring 8.6 mm. The mass is also obstructing and dilating the main pancreatic duct which measures up to 9 millimeters in the body and tail. I used a 25-gauge EUS FNB needle to sample the lesion with a transduodenal approach, 2 passes. 2. No peripancreatic adenopathy 3. Limited views of the liver, spleen were normal   03/18/2022 Pathology Results   CYTOLOGY - NON PAP  CASE: [id number]  PATIENT: [name]  Non-Gynecological Cytology Report   Clinical History: Cirrhosis, pancreatic mass  Specimen Submitted:  A. PANCREAS, HEAD, FINE NEEDLE ASPIRATION:    FINAL MICROSCOPIC DIAGNOSIS:  - Malignant cells consistent with adenocarcinoma   SPECIMEN ADEQUACY:  Satisfactory for evaluation   IMMEDIATE EVALUATION:  SUFFICIENT, POSITIVE FOR MALIGNANCY CONSISTENT WITH ADENO CA. (MPL)    03/18/2022 Cancer Staging   Staging form: Exocrine Pancreas, AJCC 8th Edition - Clinical stage from 03/18/2022: Stage IV (cT2, cN1, cM1) - Signed by Truitt Merle, MD on 04/02/2022   03/23/2022 Initial Diagnosis   Pancreatic cancer ([place])   04/02/2022 - 04/16/2022 Chemotherapy   Patient is on Treatment Plan : PANCREATIC Abraxane / Gemcitabine D1,15  q28d      Genetic Testing   [facility] CancerNext-Expanded Panel was Negative. Report date is 04/03/2022.  The CancerNext-Expanded gene panel offered by [facility] and includes sequencing, rearrangement, and RNA analysis for the following 77 genes: AIP, ALK, APC, ATM, AXIN2, BAP1, BARD1, BLM, BMPR1A, BRCA1,  BRCA2, BRIP1, CDC73, CDH1, CDK4, CDKN1B, CDKN2A, CHEK2, CTNNA1, DICER1, FANCC, FH, FLCN, GALNT12, KIF1B, LZTR1, MAX, MEN1, MET, MLH1, MSH2, MSH3, MSH6, MUTYH, NBN, NF1, NF2, NTHL1, PALB2, PHOX2B, PMS2, POT1, PRKAR1A, PTCH1, PTEN, RAD51C, RAD51D, RB1, RECQL, RET, SDHA, SDHAF2, SDHB, SDHC, SDHD, SMAD4, SMARCA4, SMARCB1, SMARCE1, STK11, SUFU, TMEM127, TP53, TSC1, TSC2, VHL and XRCC2 (sequencing and deletion/duplication); EGFR, EGLN1, HOXB13, KIT, MITF, PDGFRA, POLD1, and POLE (sequencing only); EPCAM and GREM1 (deletion/duplication only).    04/02/2022 -  Chemotherapy   Patient is on Treatment Plan : PANCREATIC Abraxane D1,8,15 + Gemcitabine D1,8,15 q28d        INTERVAL HISTORY:  Jessica Livingston is here for a follow up of pancreatic cancer. She was last seen by me on 06/11/22. She presents to the clinic alone. She reports the first week after chemo is hard on her, but she recovers well by the second week. She reports fatigue, diarrhea, ?nausea in the first week.   All other systems were reviewed with the patient and are negative.  MEDICAL HISTORY:  Past Medical History:  Diagnosis Date   breast cancer 2022   Left   CAD (coronary artery disease)    Cataract 2018   had sx   Depression    Family history of breast cancer    Heart disease '   History of radiation therapy    Left breast 05/21/21-06/18/21- Dr. Gery Pray   HLD (hyperlipidemia)    Obesity, unspecified    Other abnormal glucose    Other B-complex deficiencies    Other seborrheic keratosis    Pancreatic cancer ([place]) 02/2022   Postmenopausal     SURGICAL HISTORY: Past Surgical History:  Procedure Laterality Date   BREAST LUMPECTOMY WITH RADIOACTIVE SEED LOCALIZATION Left 04/16/2021   Procedure: LEFT BREAST LUMPECTOMY WITH RADIOACTIVE SEED LOCALIZATION;  Surgeon: Jovita Kussmaul, MD;  Location: [facility];  Service: General;  Laterality: Left;   CARDIAC CATHETERIZATION  5/03   mild to mod single vessel disease   CHOLECYSTECTOMY      COLONOSCOPY  2018   HPP/TA   ESOPHAGOGASTRODUODENOSCOPY (EGD) WITH PROPOFOL N/A 03/18/2022   Procedure: ESOPHAGOGASTRODUODENOSCOPY (EGD) WITH PROPOFOL;  Surgeon: [name], MD;  Location: [facility];  Service: Gastroenterology;  Laterality: N/A;   EUS N/A 03/18/2022   Procedure: ESOPHAGEAL ENDOSCOPIC ULTRASOUND (EUS) RADIAL;  Surgeon: [name], MD;  Location: [facility];  Service: Gastroenterology;  Laterality: N/A;   FINE NEEDLE ASPIRATION N/A 03/18/2022   Procedure: FINE NEEDLE ASPIRATION (FNA) LINEAR;  Surgeon: [name], MD;  Location: [facility];  Service: Gastroenterology;  Laterality: N/A;   IR IMAGING GUIDED PORT INSERTION  03/31/2022   LAPAROSCOPIC GASTRIC BANDING  01/13/2010   NECK SURGERY     POLYPECTOMY  2018   TONSILLECTOMY     TUBAL LIGATION Bilateral     I have reviewed the social history and family history with the patient and they are unchanged from previous note.  ALLERGIES:  is allergic to atorvastatin and rosuvastatin.  MEDICATIONS:  Current Outpatient Medications  Medication Sig Dispense Refill   Besifloxacin HCl (BESIVANCE) 0.6 % SUSP Place 1 drop into both eyes See admin instructions. Instill 1 drop into both  eyes 3 times daily once a month on the day of monthly eye injections     Cyanocobalamin (VITAMIN B-12 PO) Take 2 tablets by mouth daily. gummy     HYDROcodone-acetaminophen (NORCO) 10-325 MG tablet Take 1 tablet by mouth every 6 (six) hours as needed for severe pain or moderate pain. 30 tablet 0   lipase/protease/amylase (CREON) 36000 UNITS CPEP capsule Take 1 capsule (36,000 Units total) by mouth 3 (three) times daily with meals AND 1 capsule (36,000 Units total) 3 (three) times daily before meals. 60 capsule 0   omeprazole (PRILOSEC) 20 MG capsule Take 20 mg by mouth daily.     ondansetron (ZOFRAN) 8 MG tablet Take 1 tablet (8 mg total) by mouth every 8 (eight) hours as needed for nausea or vomiting. 20 tablet 1   ondansetron  (ZOFRAN-ODT) 4 MG disintegrating tablet Take 1 tablet (4 mg total) by mouth every 8 (eight) hours as needed for nausea or vomiting. 20 tablet 0   prochlorperazine (COMPAZINE) 10 MG tablet Take 1 tablet (10 mg total) by mouth every 6 (six) hours as needed. 30 tablet 2   VITAMIN D PO Take 2 tablets by mouth daily. gummy     No current facility-administered medications for this visit.   Facility-Administered Medications Ordered in Other Visits  Medication Dose Route Frequency Provider Last Rate Last Admin   sodium chloride flush (NS) 0.9 % injection 10 mL  10 mL Intracatheter PRN Truitt Merle, MD   10 mL at 06/25/22 1216    PHYSICAL EXAMINATION: ECOG PERFORMANCE STATUS: 1 - Symptomatic but completely ambulatory  Vitals:   06/25/22 0902  BP: (!) 144/79  Pulse: 86  Resp: 16  Temp: 98.4 F (36.9 C)  SpO2: 96%   Wt Readings from Last 3 Encounters:  06/25/22 182 lb 1.6 oz (82.6 kg)  06/11/22 181 lb 1.6 oz (82.1 kg)  05/28/22 182 lb 8 oz (82.8 kg)     GENERAL:alert, no distress and comfortable SKIN: skin color normal, no rashes or significant lesions EYES: normal, Conjunctiva are pink and non-injected, sclera clear  NEURO: alert & oriented x 3 with fluent speech  LABORATORY DATA:  I have reviewed the data as listed    Latest Ref Rng & Units 06/25/2022    8:29 AM 06/11/2022   12:07 PM 05/28/2022   11:10 AM  CBC  WBC 4.0 - 10.5 K/uL 3.7  3.2  3.0   Hemoglobin 12.0 - 15.0 g/dL 11.7  12.4  12.2   Hematocrit 36.0 - 46.0 % 34.9  37.5  36.4   Platelets 150 - 400 K/uL 209  213  191         Latest Ref Rng & Units 06/25/2022    8:29 AM 06/11/2022   12:07 PM 05/28/2022   11:10 AM  CMP  Glucose 70 - 99 mg/dL 149  124  133   BUN 8 - 23 mg/dL _0 Creatinine 0.44 - 1.00 mg/dL 0.54  0.58  0.54   Sodium 135 - 145 mmol/L 138  138  139   Potassium 3.5 - 5.1 mmol/L 4.1  4.1  3.8   Chloride 98 - 111 mmol/L 104  104  105   CO2 22 - 32 mmol/L _1 Calcium 8.9 - 10.3 mg/dL 8.5   8.7  8.6   Total Protein 6.5 - 8.1 g/dL 6.4  6.7  6.6   Total Bilirubin 0.3 - 1.2 mg/dL 0.6  0.6  0.6   Alkaline Phos 38 - 126 U/L 90  93  91   AST 15 - 41 U/L 29  33  42   ALT 0 - 44 U/L 21  29  37       RADIOGRAPHIC STUDIES: I have personally reviewed the radiological images as listed and agreed with the findings in the report. CT ABD PELVIS W/WO CM ONCOLOGY PANCREATIC PROTOCOL  Result Date: 06/25/2022 CLINICAL DATA:  Pancreatic cancer, lung nodule. Assess treatment response. * Tracking Code: BO * EXAM: CT ABDOMEN WITHOUT AND CHEST, ABDOMEN, AND PELVIS WITH CONTRAST TECHNIQUE: Multidetector CT imaging of the abdomen was performed without contrast. Subsequently multi detector CT imaging of the chest, abdomen and pelvis was performed following the standard protocol during bolus administration of intravenous contrast. RADIATION DOSE REDUCTION: This exam was performed according to the departmental dose-optimization program which includes automated exposure control, adjustment of the mA and/or kV according to patient size and/or use of iterative reconstruction technique. CONTRAST:  184m OMNIPAQUE IOHEXOL 300 MG/ML  SOLN COMPARISON:  March 30, 2022 FINDINGS: CT CHEST FINDINGS Cardiovascular: Calcified aortic atherosclerotic changes. Normal heart size without pericardial effusion. Calcified coronary artery disease of LEFT and RIGHT coronary circulation. No pericardial effusion. RIGHT-sided Port-A-Cath terminates in the upper atrium. Central pulmonary vasculature is normal caliber. Mediastinum/Nodes: No thoracic inlet, axillary, mediastinal or hilar adenopathy. Esophagus grossly normal. Lungs/Pleura: Resolution of pulmonary nodule seen previously. Basilar atelectasis. No sign of pleural effusion. No lobar consolidation. Airways are patent. Musculoskeletal: See below for full musculoskeletal details. No chest wall mass. Post breast lumpectomy on the LEFT. CT ABDOMEN AND PELVIS FINDINGS Hepatobiliary: At  least moderate hepatic steatosis. Post cholecystectomy without biliary duct dilation. Fatty intensification about the gallbladder fossa. No focal, suspicious hepatic lesion. Portal vein is patent into the liver but is occluded at the splenic portal confluence as on previous imaging with collateral pathways in the upper abdomen. Pancreas: Soft tissue at the site of ductal transition in the pancreas measuring 15 mm previously 20 mm. This is at the site of ductal transition in the neck of the pancreas. Cystic area adjacent to this has diminished in size measuring 16 mm as compared to 24 mm. Other small cystic areas and evidence of ductal obstruction without change. No signs of peripancreatic inflammation. Soft tissue continues to track into the root of the small bowel mesentery adjacent to the SMA and abutting the SMA on image 112/4. This measures 17 mm as compared to 20 mm on the prior exam there is less than 180 degree involvement of the SMA at the level of jejunal branches. Spleen: Normal. Adrenals/Urinary Tract: Normal appearance of the adrenals and kidneys. Urinary bladder is collapsed. Small amount of gas in the urinary bladder. Stomach/Bowel: Post laparoscopic gastric banding. No acute findings related to stomach or visualized small bowel and colon. Vascular/Lymphatic: Aortic atherosclerosis. No sign of aneurysm. Smooth contour of the IVC. There is no gastrohepatic or hepatoduodenal ligament lymphadenopathy. No retroperitoneal or mesenteric lymphadenopathy. Occluded SMV with collateral pathways. Splenic vein is narrowed at the portal-splenic confluence with engorgement of the IMV providing additional collateral pathways for portal venous drainage. These findings are similar to previous imaging. No pelvic adenopathy. Reproductive: Unremarkable by CT. Other: No ascites.  Mild increase inw hazy mesenteric changes. Musculoskeletal: No acute bone finding. No destructive bone process. Spinal degenerative changes.  IMPRESSION: 1. Resolution of pulmonary nodules seen previously. 2. Slight interval decrease in size of the pancreatic mass at the site of ductal transition  in the neck of the pancreas also with decreased soft tissue in the root of the small bowel mesentery and adjacent cystic changes in keeping with response to therapy. 3. Soft tissue continues to track into the root of the small bowel mesentery adjacent to the SMA and abutting the SMA. This measures 17 mm as compared to 20 mm on the prior exam. 4. Increased haziness at the root of the small bowel mesentery tracking into the small bowel mesentery may reflect venous congestion and or lymphatic congestion in the setting of disease affecting both the SMV and root of the small bowel mesentery as discussed. 5. 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 6. At least moderate hepatic steatosis. 7. Post laparoscopic gastric banding. 8. Small amount of gas in the urinary bladder. Correlate with any recent instrumentation. 9. Aortic atherosclerosis. 10. Calcified coronary artery disease of LEFT and RIGHT coronary circulation. Aortic Atherosclerosis (ICD10-I70.0). Electronically Signed   By: Zetta Bills M.D.   On: 06/25/2022 08:51   CT CHEST W CONTRAST  Result Date: 06/25/2022 CLINICAL DATA:  Pancreatic cancer, lung nodule. Assess treatment response. * Tracking Code: BO * EXAM: CT ABDOMEN WITHOUT AND CHEST, ABDOMEN, AND PELVIS WITH CONTRAST TECHNIQUE: Multidetector CT imaging of the abdomen was performed without contrast. Subsequently multi detector CT imaging of the chest, abdomen and pelvis was performed following the standard protocol during bolus administration of intravenous contrast. RADIATION DOSE REDUCTION: This exam was performed according to the departmental dose-optimization program which includes automated exposure control, adjustment of the mA and/or kV according to patient size and/or use of iterative reconstruction technique. CONTRAST:   149m OMNIPAQUE IOHEXOL 300 MG/ML  SOLN COMPARISON:  March 30, 2022 FINDINGS: CT CHEST FINDINGS Cardiovascular: Calcified aortic atherosclerotic changes. Normal heart size without pericardial effusion. Calcified coronary artery disease of LEFT and RIGHT coronary circulation. No pericardial effusion. RIGHT-sided Port-A-Cath terminates in the upper atrium. Central pulmonary vasculature is normal caliber. Mediastinum/Nodes: No thoracic inlet, axillary, mediastinal or hilar adenopathy. Esophagus grossly normal. Lungs/Pleura: Resolution of pulmonary nodule seen previously. Basilar atelectasis. No sign of pleural effusion. No lobar consolidation. Airways are patent. Musculoskeletal: See below for full musculoskeletal details. No chest wall mass. Post breast lumpectomy on the LEFT. CT ABDOMEN AND PELVIS FINDINGS Hepatobiliary: At least moderate hepatic steatosis. Post cholecystectomy without biliary duct dilation. Fatty intensification about the gallbladder fossa. No focal, suspicious hepatic lesion. Portal vein is patent into the liver but is occluded at the splenic portal confluence as on previous imaging with collateral pathways in the upper abdomen. Pancreas: Soft tissue at the site of ductal transition in the pancreas measuring 15 mm previously 20 mm. This is at the site of ductal transition in the neck of the pancreas. Cystic area adjacent to this has diminished in size measuring 16 mm as compared to 24 mm. Other small cystic areas and evidence of ductal obstruction without change. No signs of peripancreatic inflammation. Soft tissue continues to track into the root of the small bowel mesentery adjacent to the SMA and abutting the SMA on image 112/4. This measures 17 mm as compared to 20 mm on the prior exam there is less than 180 degree involvement of the SMA at the level of jejunal branches. Spleen: Normal. Adrenals/Urinary Tract: Normal appearance of the adrenals and kidneys. Urinary bladder is collapsed. Small  amount of gas in the urinary bladder. Stomach/Bowel: Post laparoscopic gastric banding. No acute findings related to stomach or visualized small bowel and colon. Vascular/Lymphatic: Aortic atherosclerosis. No sign  of aneurysm. Smooth contour of the IVC. There is no gastrohepatic or hepatoduodenal ligament lymphadenopathy. No retroperitoneal or mesenteric lymphadenopathy. Occluded SMV with collateral pathways. Splenic vein is narrowed at the portal-splenic confluence with engorgement of the IMV providing additional collateral pathways for portal venous drainage. These findings are similar to previous imaging. No pelvic adenopathy. Reproductive: Unremarkable by CT. Other: No ascites.  Mild increase inw hazy mesenteric changes. Musculoskeletal: No acute bone finding. No destructive bone process. Spinal degenerative changes. IMPRESSION: 1. Resolution of pulmonary nodules seen previously. 2. Slight interval decrease in size of the pancreatic mass at the site of ductal transition in the neck of the pancreas also with decreased soft tissue in the root of the small bowel mesentery and adjacent cystic changes in keeping with response to therapy. 3. Soft tissue continues to track into the root of the small bowel mesentery adjacent to the SMA and abutting the SMA. This measures 17 mm as compared to 20 mm on the prior exam. 4. Increased haziness at the root of the small bowel mesentery tracking into the small bowel mesentery may reflect venous congestion and or lymphatic congestion in the setting of disease affecting both the SMV and root of the small bowel mesentery as discussed. 5. 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 6. At least moderate hepatic steatosis. 7. Post laparoscopic gastric banding. 8. Small amount of gas in the urinary bladder. Correlate with any recent instrumentation. 9. Aortic atherosclerosis. 10. Calcified coronary artery disease of LEFT and RIGHT coronary circulation. Aortic  Atherosclerosis (ICD10-I70.0). Electronically Signed   By: Zetta Bills M.D.   On: 06/25/2022 08:51      Orders Placed This Encounter  Procedures   CBC with Differential ([facility] Only)    Standing Status:   Future    Standing Expiration Date:   07/31/2023   CMP ([facility] only)    Standing Status:   Future    Standing Expiration Date:   07/31/2023   CBC with Differential ([facility] Only)    Standing Status:   Future    Standing Expiration Date:   08/14/2023   CMP ([facility] only)    Standing Status:   Future    Standing Expiration Date:   08/14/2023   All questions were answered. The patient knows to call the clinic with any problems, questions or concerns. No barriers to learning was detected. The total time spent in the appointment was 40 minutes.     Truitt Merle, MD 06/25/2022   I, Wilburn Mylar, am acting as scribe for Truitt Merle, MD.   I have reviewed the above documentation for accuracy and completeness, and I agree with the above.

## 2022-06-30 ENCOUNTER — Other Ambulatory Visit: Payer: Self-pay

## 2022-07-01 ENCOUNTER — Ambulatory Visit (HOSPITAL_COMMUNITY): Admission: RE | Admit: 2022-07-01 | Payer: PPO | Source: Ambulatory Visit

## 2022-07-01 ENCOUNTER — Other Ambulatory Visit (HOSPITAL_COMMUNITY): Payer: PPO

## 2022-07-05 ENCOUNTER — Encounter (INDEPENDENT_AMBULATORY_CARE_PROVIDER_SITE_OTHER): Payer: PPO | Admitting: Ophthalmology

## 2022-07-05 DIAGNOSIS — H353231 Exudative age-related macular degeneration, bilateral, with active choroidal neovascularization: Secondary | ICD-10-CM

## 2022-07-05 DIAGNOSIS — H43813 Vitreous degeneration, bilateral: Secondary | ICD-10-CM

## 2022-07-06 ENCOUNTER — Other Ambulatory Visit: Payer: Self-pay

## 2022-07-07 ENCOUNTER — Other Ambulatory Visit: Payer: Self-pay

## 2022-07-07 NOTE — Progress Notes (Signed)
I spoke with MS [name]  and relayed the recommendations from GI Conference.  I let her know the tumor is still close to the vein therefore surgery is still not an option.  I did review radiation therapy with SBRT and the need for fiducial placement via EUS.  She told me she cancelled her chemo infusion for this week.  She does not want to continue with chemotherapy.  She did keep her appt with Dr Burr Medico.  She will consider SBRT and discuss with Dr Burr Medico on Friday.

## 2022-07-07 NOTE — Progress Notes (Signed)
The proposed treatment discussed in conference is for discussion purpose only and is not a binding recommendation.  The patients have not been physically examined, or presented with their treatment options.  Therefore, final treatment plans cannot be decided.  

## 2022-07-08 ENCOUNTER — Telehealth: Payer: Self-pay

## 2022-07-08 NOTE — Telephone Encounter (Signed)
-----   Message from Irving Copas., MD sent at 07/08/2022  5:45 AM EST ----- Jessica Livingston, Reach out to patient next week, so Oncology can talk with her about SBRT and need for fiducial placement this week. You can put her on my 11/27 date for EUS Linear. Thanks. GM ----- Message ----- From: Truitt Merle, MD Sent: 07/07/2022   9:06 AM EST To: Kyung Rudd, MD; Irving Copas., MD; #  That will be good, not too early, please schedule.  [name], could you call pt and let her know what we discussed in conference today? Also refer her to rad/onc, plan to do radiation in Dec if that works for her.  Thanks   Krista Blue  ----- Message ----- From: Irving Copas., MD Sent: 07/07/2022   8:29 AM EST To: Truitt Merle, MD; Royston Bake, RN  YF, I will have a block of spots at the end of the month that I will likely be opening. Is it too soon to do it if that spot is available? GM ----- Message ----- From: Truitt Merle, MD Sent: 07/07/2022   8:23 AM EST To: Irving Copas., MD; Royston Bake, RN  Gabe,  This is the case we discussed in conference this morning, please get her on your schedule for pancreatic fiducial placement in a few months, thanks   Krista Blue

## 2022-07-09 ENCOUNTER — Inpatient Hospital Stay: Payer: PPO | Attending: Hematology | Admitting: Hematology

## 2022-07-09 ENCOUNTER — Inpatient Hospital Stay: Payer: PPO

## 2022-07-09 ENCOUNTER — Other Ambulatory Visit: Payer: Self-pay

## 2022-07-09 ENCOUNTER — Encounter: Payer: Self-pay | Admitting: Hematology

## 2022-07-09 VITALS — BP 99/66 | HR 92 | Temp 98.5°F | Resp 18 | Ht 63.0 in | Wt 176.9 lb

## 2022-07-09 DIAGNOSIS — Z923 Personal history of irradiation: Secondary | ICD-10-CM | POA: Insufficient documentation

## 2022-07-09 DIAGNOSIS — K8689 Other specified diseases of pancreas: Secondary | ICD-10-CM

## 2022-07-09 DIAGNOSIS — R42 Dizziness and giddiness: Secondary | ICD-10-CM | POA: Insufficient documentation

## 2022-07-09 DIAGNOSIS — C250 Malignant neoplasm of head of pancreas: Secondary | ICD-10-CM

## 2022-07-09 DIAGNOSIS — C259 Malignant neoplasm of pancreas, unspecified: Secondary | ICD-10-CM | POA: Diagnosis not present

## 2022-07-09 DIAGNOSIS — Z853 Personal history of malignant neoplasm of breast: Secondary | ICD-10-CM | POA: Diagnosis not present

## 2022-07-09 DIAGNOSIS — R197 Diarrhea, unspecified: Secondary | ICD-10-CM | POA: Insufficient documentation

## 2022-07-09 DIAGNOSIS — R5383 Other fatigue: Secondary | ICD-10-CM | POA: Diagnosis not present

## 2022-07-09 DIAGNOSIS — Z79899 Other long term (current) drug therapy: Secondary | ICD-10-CM | POA: Insufficient documentation

## 2022-07-09 DIAGNOSIS — Z95828 Presence of other vascular implants and grafts: Secondary | ICD-10-CM

## 2022-07-09 MED ORDER — SODIUM CHLORIDE 0.9% FLUSH: 10.0000 mL | Freq: Once | INTRAVENOUS | Status: DC

## 2022-07-09 MED ORDER — ONDANSETRON HCL 8 MG PO TABS: 8.0000 mg | ORAL_TABLET | Freq: Three times a day (TID) | ORAL | 1 refills | Status: DC | PRN

## 2022-07-09 MED ORDER — HYDROCODONE-ACETAMINOPHEN 10-325 MG PO TABS: 1.0000 | ORAL_TABLET | Freq: Four times a day (QID) | ORAL | 0 refills | Status: DC | PRN

## 2022-07-09 NOTE — Telephone Encounter (Signed)
EUS has been scheduled for 07/26/22 at 1030 am at [facility] with GM    Left message on machine to call back

## 2022-07-09 NOTE — Progress Notes (Addendum)
[place]   [phone] Fax:([phone]) [phone]   Clinic Follow up Note   Patient Care Team: Deland Pretty, MD as PCP - General (Internal Medicine) Mauro Kaufmann, RN as Oncology Nurse Navigator Rockwell Germany, RN as Oncology Nurse Navigator Jovita Kussmaul, MD as Consulting Physician (General Surgery) Nicholas Lose, MD as Consulting Physician (Hematology and Oncology) Gery Pray, MD as Consulting Physician (Radiation Oncology) Delice Bison, Charlestine Massed, NP as Nurse Practitioner (Hematology and Oncology) Harmon Pier, RN as Registered Nurse Truitt Merle, MD as Consulting Physician (Oncology) Royston Bake, RN as Oncology Nurse Navigator (Oncology)  Date of Service:  07/09/2022  CHIEF COMPLAINT: f/u of pancreatic cancer  CURRENT THERAPY:  Pending SBRT  ASSESSMENT & PLAN:  Jessica Livingston is a 81 y.o. female with   1. Pancreatic adenocarcinoma, cT2N1Mx, with indeterminate lung nodules -diagnosed 02/2022 by EUS/FNA. Staging CT showed sub-cm lung nodules. 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 -she began abraxane/gemcitabine every 2 weeks on 04/02/22. She has had good clinical response. -restaging CT CAP 06/24/22 showed overall good response to therapy.  -her accumulating side effects from chemo are becoming hard for her, and she would like to stop chemo. We previously discussed this at length. I reviewed her case in our tumor conference. We will hold chemo for now and move to SBRT. I again reviewed that the chance for cure is very low and that she will likely have cancer progression after radiation at some point, and she can restart chemo at the cancer progression. She expressed understanding and would like to proceed with radiation next months. -she is scheduled to see Dr. Sondra Come back on 07/26/22 to discuss SBRT. -I will reach out to GI Dr. Rush Landmark for fiducial placement, which will be likely on July 26, 2022   2. Symptom  Management: Diarrhea, Weight loss -she was previously prescribed creon, which she is currently not taking due to cost. She is using imodium. -she has lost ~15 lbs since starting chemo.  She is slightly orthostatic, I offered her to get IVF today, she declined.   3. H/o Left breast DCIS, ER+/PR+, grade 3 -diagnosed in 01/2021. S/p lumpectomy 04/16/21, path showed DCIS. Treated with adjuvant radiation 05/22/21 - 06/18/21. She declined antiestrogen therapy.     PLAN: -stop chemo due to poor tolerance  -f/u with Dr. Sondra Come 11/27 to proceed with SBRT to her pancreatic cancer  -Fiducial placement with Dr. Rush Landmark on November 27 -lab, flush, and f/u in 6 weeks for follow up    No problem-specific Assessment & Plan notes found for this encounter.   SUMMARY OF ONCOLOGIC HISTORY: Oncology History  Ductal carcinoma in situ (DCIS) of left breast  02/17/2021 Initial Diagnosis   Screening mammogram detected left breast calcifications lower inner quadrant 0.9 cm: Biopsy revealed high-grade DCIS with necrosis and calcifications, ER 30%, PR 50% weak   02/25/2021 Cancer Staging   Staging form: Breast, AJCC 8th Edition - Clinical stage from 02/25/2021: Stage 0 (cTis (DCIS), cN0, cM0, G3, ER+, PR+, HER2: Not Assessed) - Signed by Nicholas Lose, MD on 02/25/2021 Stage prefix: Initial diagnosis Histologic grading system: 3 grade system   04/16/2021 Cancer Staging   Staging form: Breast, AJCC 8th Edition - Pathologic stage from 04/16/2021: Stage 0 (pTis (DCIS), pN0, cM0, ER+, PR+) - Signed by Gardenia Phlegm, NP on 09/04/2021 Stage prefix: Initial diagnosis Nuclear grade: G3   04/16/2021 Surgery   Left breast lumpectomy with radioactive seed localization  05/21/2021 - 06/18/2021 Radiation Therapy    Radiation Treatment Dates: 05/21/2021 through 06/18/2021 Site Technique Total Dose (Gy) Dose per Fx (Gy) Completed Fx Beam Energies  Breast, Left: Breast_Lt 3D 40.05/40.05 2.67 15/15 6X, 10X  Breast,  Left: Breast_Lt_Bst 3D 10/10 2 5/5 6X    Pancreatic cancer ([place])  03/04/2022 Imaging   CT ABDOMEN W CONTRAST   IMPRESSION: 1. Findings highly suspicious for adenocarcinoma in the pancreatic head with resultant upstream atrophy and duct dilatation. Venous encasement as detailed above. No arterial involvement. 2. Suspect cirrhosis and mild steatosis. 3. Coronary artery atherosclerosis. Aortic Atherosclerosis (ICD10-I70.0).   03/09/2022 Tumor Marker   Patient's tumor was tested for the following markers: CA-19.9. Results of the tumor marker test revealed 1715.   03/18/2022 Procedure   EUS-Dr. Ardis Hughs  1. Irregularly shaped, indistinctly bordered, heterogeneous, hypoechoic mass that measures 3.4 cm maximally in the uncinate/head of pancreas. The mass clearly surrounds and attenuates the portal vein near the splenic vein, SMV confluence. The mass is causing mild biliary duct obstruction with the common bile duct measuring 8.6 mm. The mass is also obstructing and dilating the main pancreatic duct which measures up to 9 millimeters in the body and tail. I used a 25-gauge EUS FNB needle to sample the lesion with a transduodenal approach, 2 passes. 2. No peripancreatic adenopathy 3. Limited views of the liver, spleen were normal   03/18/2022 Pathology Results   CYTOLOGY - NON PAP  CASE: [id number]  PATIENT: Jessica Livingston  Non-Gynecological Cytology Report   Clinical History: Cirrhosis, pancreatic mass  Specimen Submitted:  A. PANCREAS, HEAD, FINE NEEDLE ASPIRATION:    FINAL MICROSCOPIC DIAGNOSIS:  - Malignant cells consistent with adenocarcinoma   SPECIMEN ADEQUACY:  Satisfactory for evaluation   IMMEDIATE EVALUATION:  SUFFICIENT, POSITIVE FOR MALIGNANCY CONSISTENT WITH ADENO CA. (MPL)    03/18/2022 Cancer Staging   Staging form: Exocrine Pancreas, AJCC 8th Edition - Clinical stage from 03/18/2022: Stage IV (cT2, cN1, cM1) - Signed by Truitt Merle, MD on 04/02/2022   03/23/2022  Initial Diagnosis   Pancreatic cancer ([place])   04/02/2022 - 04/16/2022 Chemotherapy   Patient is on Treatment Plan : PANCREATIC Abraxane / Gemcitabine D1,15 q28d      Genetic Testing   [facility] CancerNext-Expanded Panel was Negative. Report date is 04/03/2022.  The CancerNext-Expanded gene panel offered by [facility] and includes sequencing, rearrangement, and RNA analysis for the following 77 genes: AIP, ALK, APC, ATM, AXIN2, BAP1, BARD1, BLM, BMPR1A, BRCA1, BRCA2, BRIP1, CDC73, CDH1, CDK4, CDKN1B, CDKN2A, CHEK2, CTNNA1, DICER1, FANCC, FH, FLCN, GALNT12, KIF1B, LZTR1, MAX, MEN1, MET, MLH1, MSH2, MSH3, MSH6, MUTYH, NBN, NF1, NF2, NTHL1, PALB2, PHOX2B, PMS2, POT1, PRKAR1A, PTCH1, PTEN, RAD51C, RAD51D, RB1, RECQL, RET, SDHA, SDHAF2, SDHB, SDHC, SDHD, SMAD4, SMARCA4, SMARCB1, SMARCE1, STK11, SUFU, TMEM127, TP53, TSC1, TSC2, VHL and XRCC2 (sequencing and deletion/duplication); EGFR, EGLN1, HOXB13, KIT, MITF, PDGFRA, POLD1, and POLE (sequencing only); EPCAM and GREM1 (deletion/duplication only).    04/02/2022 -  Chemotherapy   Patient is on Treatment Plan : PANCREATIC Abraxane D1,8,15 + Gemcitabine D1,8,15 q28d        INTERVAL HISTORY:  Jessica Livingston is here for a follow up of pancreatic cancer. She was last seen by me on 06/25/22. She presents to the clinic alone. She reports she isn't recovering as well as she usually does. She explains she is fatigued and feels dizzy. She has also been losing weight on treatment.   All other systems were reviewed with the patient and are negative.  MEDICAL HISTORY:  Past Medical History:  Diagnosis Date   breast cancer 2022   Left   CAD (coronary artery disease)    Cataract 2018   had sx   Depression    Family history of breast cancer    Heart disease '   History of radiation therapy    Left breast 05/21/21-06/18/21- Dr. Gery Pray   HLD (hyperlipidemia)    Obesity, unspecified    Other abnormal glucose    Other B-complex deficiencies     Other seborrheic keratosis    Pancreatic cancer ([place]) 02/2022   Postmenopausal     SURGICAL HISTORY: Past Surgical History:  Procedure Laterality Date   BREAST LUMPECTOMY WITH RADIOACTIVE SEED LOCALIZATION Left 04/16/2021   Procedure: LEFT BREAST LUMPECTOMY WITH RADIOACTIVE SEED LOCALIZATION;  Surgeon: Jovita Kussmaul, MD;  Location: [place];  Service: General;  Laterality: Left;   CARDIAC CATHETERIZATION  5/03   mild to mod single vessel disease   CHOLECYSTECTOMY     COLONOSCOPY  2018   HPP/TA   ESOPHAGOGASTRODUODENOSCOPY (EGD) WITH PROPOFOL N/A 03/18/2022   Procedure: ESOPHAGOGASTRODUODENOSCOPY (EGD) WITH PROPOFOL;  Surgeon: [name], MD;  Location: [facility];  Service: Gastroenterology;  Laterality: N/A;   EUS N/A 03/18/2022   Procedure: ESOPHAGEAL ENDOSCOPIC ULTRASOUND (EUS) RADIAL;  Surgeon: [name], MD;  Location: [facility];  Service: Gastroenterology;  Laterality: N/A;   FINE NEEDLE ASPIRATION N/A 03/18/2022   Procedure: FINE NEEDLE ASPIRATION (FNA) LINEAR;  Surgeon: [name], MD;  Location: [facility];  Service: Gastroenterology;  Laterality: N/A;   IR IMAGING GUIDED PORT INSERTION  03/31/2022   LAPAROSCOPIC GASTRIC BANDING  01/13/2010   NECK SURGERY     POLYPECTOMY  2018   TONSILLECTOMY     TUBAL LIGATION Bilateral     I have reviewed the social history and family history with the patient and they are unchanged from previous note.  ALLERGIES:  is allergic to atorvastatin and rosuvastatin.  MEDICATIONS:  Current Outpatient Medications  Medication Sig Dispense Refill   Besifloxacin HCl (BESIVANCE) 0.6 % SUSP Place 1 drop into both eyes See admin instructions. Instill 1 drop into both eyes 3 times daily once a month on the day of monthly eye injections     Cyanocobalamin (VITAMIN B-12 PO) Take 2 tablets by mouth daily. gummy     HYDROcodone-acetaminophen (NORCO) 10-325 MG tablet Take 1 tablet by mouth every 6 (six) hours as needed for severe pain or  moderate pain. 30 tablet 0   lipase/protease/amylase (CREON) 36000 UNITS CPEP capsule Take 1 capsule (36,000 Units total) by mouth 3 (three) times daily with meals AND 1 capsule (36,000 Units total) 3 (three) times daily before meals. 60 capsule 0   omeprazole (PRILOSEC) 20 MG capsule Take 20 mg by mouth daily.     ondansetron (ZOFRAN) 8 MG tablet Take 1 tablet (8 mg total) by mouth every 8 (eight) hours as needed for nausea or vomiting. 30 tablet 1   ondansetron (ZOFRAN-ODT) 4 MG disintegrating tablet Take 1 tablet (4 mg total) by mouth every 8 (eight) hours as needed for nausea or vomiting. 20 tablet 0   prochlorperazine (COMPAZINE) 10 MG tablet Take 1 tablet (10 mg total) by mouth every 6 (six) hours as needed. 30 tablet 2   VITAMIN D PO Take 2 tablets by mouth daily. gummy     No current facility-administered medications for this visit.    PHYSICAL EXAMINATION: ECOG PERFORMANCE STATUS: 2 - Symptomatic, <50% confined to  bed  Vitals:   07/09/22 1106 07/09/22 1108  BP: 132/86 99/66  Pulse: 89 92  Resp:    Temp:    SpO2:     Wt Readings from Last 3 Encounters:  07/09/22 176 lb 14.4 oz (80.2 kg)  06/25/22 182 lb 1.6 oz (82.6 kg)  06/11/22 181 lb 1.6 oz (82.1 kg)     GENERAL:alert, no distress and comfortable SKIN: skin color normal, no rashes or significant lesions EYES: normal, Conjunctiva are pink and non-injected, sclera clear  NEURO: alert & oriented x 3 with fluent speech  LABORATORY DATA:  I have reviewed the data as listed    Latest Ref Rng & Units 06/25/2022    8:29 AM 06/11/2022   12:07 PM 05/28/2022   11:10 AM  CBC  WBC 4.0 - 10.5 K/uL 3.7  3.2  3.0   Hemoglobin 12.0 - 15.0 g/dL 11.7  12.4  12.2   Hematocrit 36.0 - 46.0 % 34.9  37.5  36.4   Platelets 150 - 400 K/uL 209  213  191         Latest Ref Rng & Units 06/25/2022    8:29 AM 06/11/2022   12:07 PM 05/28/2022   11:10 AM  CMP  Glucose 70 - 99 mg/dL 149  124  133   BUN 8 - 23 mg/dL _0 Creatinine 0.44 - 1.00 mg/dL 0.54  0.58  0.54   Sodium 135 - 145 mmol/L 138  138  139   Potassium 3.5 - 5.1 mmol/L 4.1  4.1  3.8   Chloride 98 - 111 mmol/L 104  104  105   CO2 22 - 32 mmol/L _1 Calcium 8.9 - 10.3 mg/dL 8.5  8.7  8.6   Total Protein 6.5 - 8.1 g/dL 6.4  6.7  6.6   Total Bilirubin 0.3 - 1.2 mg/dL 0.6  0.6  0.6   Alkaline Phos 38 - 126 U/L 90  93  91   AST 15 - 41 U/L 29  33  42   ALT 0 - 44 U/L 21  29  37       RADIOGRAPHIC STUDIES: I have personally reviewed the radiological images as listed and agreed with the findings in the report. No results found.    No orders of the defined types were placed in this encounter.  All questions were answered. The patient knows to call the clinic with any problems, questions or concerns. No barriers to learning was detected. The total time spent in the appointment was 30 minutes.     Truitt Merle, MD 07/09/2022   I, Wilburn Mylar, am acting as scribe for Truitt Merle, MD.   I have reviewed the above documentation for accuracy and completeness, and I agree with the above.

## 2022-07-09 NOTE — Progress Notes (Signed)
Pt does not want port accessed today. Pt arrived to Doctors appointment per request.

## 2022-07-12 ENCOUNTER — Telehealth: Payer: Self-pay | Admitting: Hematology

## 2022-07-12 NOTE — Telephone Encounter (Signed)
EUS scheduled, pt instructed and medications reviewed.  Patient instructions mailed to home and sent to My Chart .  Patient to call with any questions or concerns.  

## 2022-07-12 NOTE — Telephone Encounter (Signed)
Spoke with patient confirming upcoming appointments  

## 2022-07-13 ENCOUNTER — Other Ambulatory Visit: Payer: Self-pay

## 2022-07-14 ENCOUNTER — Encounter: Payer: Self-pay | Admitting: Hematology

## 2022-07-15 ENCOUNTER — Encounter (HOSPITAL_COMMUNITY): Payer: Self-pay | Admitting: Gastroenterology

## 2022-07-16 ENCOUNTER — Encounter (HOSPITAL_COMMUNITY): Payer: Self-pay | Admitting: Gastroenterology

## 2022-07-17 ENCOUNTER — Other Ambulatory Visit: Payer: Self-pay

## 2022-07-25 NOTE — Progress Notes (Incomplete)
Radiation Oncology         [phone] ________________________________  Name: Jessica Livingston MRN: [id number]  Date: 07/26/2022  DOB: April 24, 1941  Follow-Up Visit Note  CC: Deland Pretty, MD  Deland Pretty, MD  No diagnosis found.  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 Since her last visit, the patient has had several hospital encounters, detailed as follows:  -- 01/27/22 ED: Patient presented for evaluation of intermittent LUQ abdominal pain x 1 month. Given negative work up and absence of associated symptoms, she was advised to try and apply heat to the affected area and tylenol to determine if her pain was musculoskeletal in etiology.  - Her abdominal pain persistent prompting a CT of the abdomen on 03/04/22 which revealed a pancreatic lesion measuring 2.5 cm suspicious for adenocarcinoma. The lesion was also seen with resultant upstream atrophy and duct dilatation.  -- 03/11/22 ED: the patient returned to the ED with new onset nausea and vomiting. She also endorsed persistent upper abdominal pain and back pain for the past 2 months. CT scan performed showed stability of the pancreatic mass and pancreatic duct dilation.  No bowel obstruction was appreciated. GI was consulted who recommended EUS and EGD however the patient ate that morning and endorsed UTI symptoms.   Following  discharge, the patient accordingly returned for inpatient EGD and biopsies of the pancreatic lesion on 03/18/22 under the care of Dr. Ardis Hughs (GI). FNA of pancreas on 03/18/22 showed malignant cells consistent with adenocarcinoma.   Genetic testing collected on 03/24/22 showed no clinically significant variants detected by +RNAinsight testing.  Staging chest CT on 03/30/22 showed 4 subcentimeter pulmonary nodules in both lungs, the largest of which measuring 9 mm, suspicious for pulmonary metastases. No thoracic adenopathy was appreciated.   Accordingly, the patient was referred to Dr. Burr Medico and opted for port placement on 03/31/22 in anticipation of beginning chemotherapy consisting of abraxane/gemcitabine. She received her first infusion on 04/02/22. She initially tolerated chemotherapy well other than diarrhea, mild fatigue, and low appetite. However, she later developed progressive fatigue, dizziness and weight loss prompting her to discontinue chemo early and move on to SBRT. Dr. Burr Medico has informed her that she will likely have cancer progression after radiation at some point, and that she can restart chemo if and when cancer progression occurs.    Her most recent CT CAP on 06/24/22 showed a good response to neoadjuvant treatment, detailed by: resolution of previously seen pulmonary nodules; a slight interval decrease in size of the pancreatic mass at the site of ductal transition in the neck of the pancreas; decreased soft tissue in the root of the small bowel mesentery and adjacent cystic changes; and a slight decrease in size of soft tissue tracking into the root of the small bowel mesentery adjacent to the SMA and abutting the SMA;   The patient was also referred to Dr. Zenia Resides (General Surgery) on  04/12/22 for consideration of surgical resectioning. Given concerns of metastatic disease in the chest, and recent labs showing significantly elevated CA19-9 concerning for micrometastatic disease, Dr. Zenia Resides  advised the patient to continue with neoadjuvant chemotherapy followed by repeat assessment to determine the need for surgical intervention. Given that the patient has opted to discontinue neoadjuvant treatment early and proceed with SBRT, she will likely follow-up with general surgery following radiation for reassessment.    Allergies:  is allergic to atorvastatin and rosuvastatin.  Meds: Current Outpatient Medications  Medication Sig Dispense Refill   Besifloxacin HCl (BESIVANCE) 0.6 % SUSP Place 1 drop into both eyes See admin instructions. Instill 1 drop into both eyes 3 times daily once a month on the day of monthly eye injections     HYDROcodone-acetaminophen (NORCO) 10-325 MG tablet Take 1 tablet by mouth every 6 (six) hours as needed for severe pain or moderate pain. 30 tablet 0   lipase/protease/amylase (CREON) 36000 UNITS CPEP capsule Take 1 capsule (36,000 Units total) by mouth 3 (three) times daily with meals AND 1 capsule (36,000 Units total) 3 (three) times daily before meals. (Patient not taking: Reported on 07/19/2022) 60 capsule 0   ondansetron (ZOFRAN) 8 MG tablet Take 1 tablet (8 mg total) by mouth every 8 (eight) hours as needed for nausea or vomiting. 30 tablet 1   ondansetron (ZOFRAN-ODT) 4 MG disintegrating tablet Take 1 tablet (4 mg total) by mouth every 8 (eight) hours as needed for nausea or vomiting. (Patient not taking: Reported on 07/19/2022) 20 tablet 0   prochlorperazine (COMPAZINE) 10 MG tablet Take 1 tablet (10 mg total) by mouth every 6 (six) hours as needed. (Patient not taking: Reported on 07/19/2022) 30 tablet 2   No current facility-administered medications for this encounter.    Physical Findings: The patient is in no acute distress. Patient is alert and oriented.  vitals were not taken for this visit. .  No significant changes. Lungs are clear to auscultation bilaterally. Heart has regular rate and rhythm. No palpable cervical, supraclavicular, or axillary  adenopathy. Abdomen soft, non-tender, normal bowel sounds.  Right Breast: no palpable mass, nipple discharge or bleeding. Left Breast: ***  Lab Findings: Lab Results  Component Value Date   WBC 3.7 (L) 06/25/2022   HGB 11.7 (L) 06/25/2022   HCT 34.9 (L) 06/25/2022   MCV 99.1 06/25/2022   PLT 209 06/25/2022    Radiographic Findings: No results found.  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  ***  Plan:  ***   *** minutes of total time was spent for this patient encounter, including preparation, face-to-face counseling with the patient and coordination of care, physical exam, and documentation of the encounter. ____________________________________  Blair Promise, PhD, MD  This document serves as a record of services personally performed by Gery Pray, MD. It was created on his behalf by Roney Mans, a trained medical scribe. The creation of this record is based on the scribe's personal observations and the provider's statements to them. This document has been checked and approved by the attending provider.

## 2022-07-26 ENCOUNTER — Other Ambulatory Visit: Payer: Self-pay

## 2022-07-26 ENCOUNTER — Ambulatory Visit (HOSPITAL_COMMUNITY)
Admission: RE | Admit: 2022-07-26 | Discharge: 2022-07-26 | Disposition: A | Discharge status: Home / Self Care | Payer: PPO | Source: Ambulatory Visit | Attending: Gastroenterology | Admitting: Gastroenterology

## 2022-07-26 ENCOUNTER — Ambulatory Visit
Admission: RE | Admit: 2022-07-26 | Discharge: 2022-07-26 | Disposition: A | Discharge status: Home / Self Care | Payer: PPO | Source: Ambulatory Visit | Attending: Radiation Oncology | Admitting: Radiation Oncology

## 2022-07-26 ENCOUNTER — Encounter (HOSPITAL_COMMUNITY): Payer: Self-pay | Admitting: Gastroenterology

## 2022-07-26 ENCOUNTER — Ambulatory Visit (HOSPITAL_BASED_OUTPATIENT_CLINIC_OR_DEPARTMENT_OTHER): Payer: PPO | Admitting: Anesthesiology

## 2022-07-26 ENCOUNTER — Ambulatory Visit (HOSPITAL_COMMUNITY): Payer: PPO | Admitting: Anesthesiology

## 2022-07-26 ENCOUNTER — Encounter (HOSPITAL_COMMUNITY)
Admission: RE | Disposition: A | Discharge status: Home / Self Care | Payer: Self-pay | Source: Ambulatory Visit | Attending: Gastroenterology

## 2022-07-26 DIAGNOSIS — K449 Diaphragmatic hernia without obstruction or gangrene: Secondary | ICD-10-CM | POA: Diagnosis not present

## 2022-07-26 DIAGNOSIS — C259 Malignant neoplasm of pancreas, unspecified: Secondary | ICD-10-CM | POA: Diagnosis not present

## 2022-07-26 DIAGNOSIS — E669 Obesity, unspecified: Secondary | ICD-10-CM | POA: Insufficient documentation

## 2022-07-26 DIAGNOSIS — Z6831 Body mass index (BMI) 31.0-31.9, adult: Secondary | ICD-10-CM | POA: Diagnosis not present

## 2022-07-26 DIAGNOSIS — K222 Esophageal obstruction: Secondary | ICD-10-CM | POA: Insufficient documentation

## 2022-07-26 DIAGNOSIS — I878 Other specified disorders of veins: Secondary | ICD-10-CM | POA: Diagnosis not present

## 2022-07-26 DIAGNOSIS — K2950 Unspecified chronic gastritis without bleeding: Secondary | ICD-10-CM | POA: Diagnosis not present

## 2022-07-26 DIAGNOSIS — I2510 Atherosclerotic heart disease of native coronary artery without angina pectoris: Secondary | ICD-10-CM | POA: Insufficient documentation

## 2022-07-26 DIAGNOSIS — K3189 Other diseases of stomach and duodenum: Secondary | ICD-10-CM | POA: Diagnosis not present

## 2022-07-26 DIAGNOSIS — Z853 Personal history of malignant neoplasm of breast: Secondary | ICD-10-CM | POA: Insufficient documentation

## 2022-07-26 DIAGNOSIS — K8689 Other specified diseases of pancreas: Secondary | ICD-10-CM | POA: Diagnosis not present

## 2022-07-26 DIAGNOSIS — F32A Depression, unspecified: Secondary | ICD-10-CM | POA: Insufficient documentation

## 2022-07-26 DIAGNOSIS — Z9884 Bariatric surgery status: Secondary | ICD-10-CM | POA: Insufficient documentation

## 2022-07-26 HISTORY — PX: EUS: SHX5427

## 2022-07-26 HISTORY — PX: FIDUCIAL MARKER PLACEMENT: SHX6858

## 2022-07-26 HISTORY — PX: ESOPHAGOGASTRODUODENOSCOPY (EGD) WITH PROPOFOL: SHX5813

## 2022-07-26 HISTORY — PX: BIOPSY: SHX5522

## 2022-07-26 SURGERY — UPPER ENDOSCOPIC ULTRASOUND (EUS) LINEAR
Anesthesia: Monitor Anesthesia Care

## 2022-07-26 MED ORDER — CIPROFLOXACIN IN D5W 400 MG/200ML IV SOLN
INTRAVENOUS | Status: AC
  Filled 2022-07-26: qty 200

## 2022-07-26 MED ORDER — LACTATED RINGERS IV SOLN: INTRAVENOUS | Status: DC

## 2022-07-26 MED ORDER — PROPOFOL 10 MG/ML IV BOLUS
INTRAVENOUS | Status: DC | PRN
  Administered 2022-07-26 (×5): 20 mg via INTRAVENOUS

## 2022-07-26 MED ORDER — CIPROFLOXACIN IN D5W 400 MG/200ML IV SOLN
INTRAVENOUS | Status: DC | PRN
  Administered 2022-07-26: 400 mg via INTRAVENOUS

## 2022-07-26 MED ORDER — PROPOFOL 1000 MG/100ML IV EMUL
INTRAVENOUS | Status: AC
  Filled 2022-07-26: qty 100

## 2022-07-26 MED ORDER — CIPROFLOXACIN HCL 500 MG PO TABS: 500.0000 mg | ORAL_TABLET | Freq: Two times a day (BID) | ORAL | 0 refills | Status: AC

## 2022-07-26 MED ORDER — PROPOFOL 500 MG/50ML IV EMUL
INTRAVENOUS | Status: DC | PRN
  Administered 2022-07-26: 100 ug/kg/min via INTRAVENOUS

## 2022-07-26 MED ORDER — OMEPRAZOLE MAGNESIUM 20 MG PO TBEC: 20.0000 mg | DELAYED_RELEASE_TABLET | Freq: Every day | ORAL | 1 refills | Status: DC

## 2022-07-26 MED ORDER — SODIUM CHLORIDE 0.9 % IV SOLN: INTRAVENOUS | Status: DC

## 2022-07-26 SURGICAL SUPPLY — 1 items: beacon fiducials IMPLANT

## 2022-07-26 NOTE — Anesthesia Preprocedure Evaluation (Addendum)
Anesthesia Evaluation  Patient identified by MRN, date of birth, ID band Patient awake    Reviewed: Allergy & Precautions, NPO status , Patient's Chart, lab work & pertinent test results  History of Anesthesia Complications Negative for: history of anesthetic complications  Airway Mallampati: III  TM Distance: >3 FB Neck ROM: Full    Dental  (+) Dental Advisory Given, Teeth Intact   Pulmonary neg pulmonary ROS   Pulmonary exam normal        Cardiovascular + CAD  Normal cardiovascular exam     Neuro/Psych  PSYCHIATRIC DISORDERS  Depression    negative neurological ROS     GI/Hepatic Neg liver ROS,,, S/p gastric banding Pancreatic cancer    Endo/Other  negative endocrine ROS    Renal/GU negative Renal ROS     Musculoskeletal negative musculoskeletal ROS (+)    Abdominal   Peds  Hematology negative hematology ROS (+)   Anesthesia Other Findings   Reproductive/Obstetrics  Breast cancer                              Anesthesia Physical Anesthesia Plan  ASA: 3  Anesthesia Plan: MAC   Post-op Pain Management: Minimal or no pain anticipated   Induction:   PONV Risk Score and Plan: 2 and Propofol infusion and Treatment may vary due to age or medical condition  Airway Management Planned: Nasal Cannula and Natural Airway  Additional Equipment: None  Intra-op Plan:   Post-operative Plan:   Informed Consent: I have reviewed the patients History and Physical, chart, labs and discussed the procedure including the risks, benefits and alternatives for the proposed anesthesia with the patient or authorized representative who has indicated his/her understanding and acceptance.       Plan Discussed with: CRNA and Anesthesiologist  Anesthesia Plan Comments:        Anesthesia Quick Evaluation

## 2022-07-26 NOTE — Transfer of Care (Signed)
Immediate Anesthesia Transfer of Care Note  Patient: Jessica Livingston  Procedure(s) Performed: UPPER ENDOSCOPIC ULTRASOUND (EUS) LINEAR FIDUCIAL MARKER PLACEMENT BIOPSY  Patient Location: PACU  Anesthesia Type:MAC  Level of Consciousness: sedated  Airway & Oxygen Therapy: Patient Spontanous Breathing and Patient connected to face mask oxygen  Post-op Assessment: Report given to RN and Post -op Vital signs reviewed and stable  Post vital signs: Reviewed and stable  Last Vitals:  Vitals Value Taken Time  BP    Temp    Pulse    Resp 18 07/26/22 1053  SpO2    Vitals shown include unvalidated device data.  Last Pain:  Vitals:   07/26/22 0920  TempSrc: Tympanic  PainSc: 0-No pain         Complications: No notable events documented.

## 2022-07-26 NOTE — Anesthesia Postprocedure Evaluation (Signed)
Anesthesia Post Note  Patient: Jessica Livingston  Procedure(s) Performed: UPPER ENDOSCOPIC ULTRASOUND (EUS) LINEAR FIDUCIAL MARKER PLACEMENT BIOPSY     Patient location during evaluation: PACU Anesthesia Type: MAC Level of consciousness: awake and alert Pain management: pain level controlled Vital Signs Assessment: post-procedure vital signs reviewed and stable Respiratory status: spontaneous breathing, nonlabored ventilation and respiratory function stable Cardiovascular status: stable and blood pressure returned to baseline Anesthetic complications: no   No notable events documented.  Last Vitals:  Vitals:   07/26/22 1120 07/26/22 1128  BP: 136/77   Pulse: 72 73  Resp: (!) 30 (!) 23  Temp:    SpO2: 97% 95%    Last Pain:  Vitals:   07/26/22 1128  TempSrc:   PainSc: 0-No pain                 Audry Pili

## 2022-07-26 NOTE — H&P (Signed)
GASTROENTEROLOGY PROCEDURE H&P NOTE   Primary Care Physician: Deland Pretty, MD  HPI: Jessica Livingston is a 81 y.o. female who presents for EGD/EUS for fiducial placement in setting of pancreas cancer and need for SBRT.  Past Medical History:  Diagnosis Date   breast cancer 2022   Left   CAD (coronary artery disease)    Cataract 2018   had sx   Depression    Family history of breast cancer    Heart disease '   History of radiation therapy    Left breast 05/21/21-06/18/21- Dr. Gery Pray   HLD (hyperlipidemia)    Obesity, unspecified    Other abnormal glucose    Other B-complex deficiencies    Other seborrheic keratosis    Pancreatic cancer ([place]) 02/2022   Postmenopausal    Past Surgical History:  Procedure Laterality Date   BREAST LUMPECTOMY WITH RADIOACTIVE SEED LOCALIZATION Left 04/16/2021   Procedure: LEFT BREAST LUMPECTOMY WITH RADIOACTIVE SEED LOCALIZATION;  Surgeon: Jovita Kussmaul, MD;  Location: [facility];  Service: General;  Laterality: Left;   CARDIAC CATHETERIZATION  5/03   mild to mod single vessel disease   CHOLECYSTECTOMY     COLONOSCOPY  2018   HPP/TA   ESOPHAGOGASTRODUODENOSCOPY (EGD) WITH PROPOFOL N/A 03/18/2022   Procedure: ESOPHAGOGASTRODUODENOSCOPY (EGD) WITH PROPOFOL;  Surgeon: [name], MD;  Location: [facility];  Service: Gastroenterology;  Laterality: N/A;   EUS N/A 03/18/2022   Procedure: ESOPHAGEAL ENDOSCOPIC ULTRASOUND (EUS) RADIAL;  Surgeon: [name], MD;  Location: [facility];  Service: Gastroenterology;  Laterality: N/A;   FINE NEEDLE ASPIRATION N/A 03/18/2022   Procedure: FINE NEEDLE ASPIRATION (FNA) LINEAR;  Surgeon: [name], MD;  Location: [facility];  Service: Gastroenterology;  Laterality: N/A;   IR IMAGING GUIDED PORT INSERTION  03/31/2022   LAPAROSCOPIC GASTRIC BANDING  01/13/2010   NECK SURGERY     POLYPECTOMY  2018   TONSILLECTOMY     TUBAL LIGATION Bilateral    Current Facility-Administered Medications   Medication Dose Route Frequency Provider Last Rate Last Admin   0.9 %  sodium chloride infusion   Intravenous Continuous Mansouraty, Telford Nab., MD       lactated ringers infusion   Intravenous Continuous Mansouraty, Telford Nab., MD   New Bag at 07/26/22 0932    Current Facility-Administered Medications:    0.9 %  sodium chloride infusion, , Intravenous, Continuous, Mansouraty, Telford Nab., MD   lactated ringers infusion, , Intravenous, Continuous, Mansouraty, Telford Nab., MD, New Bag at 07/26/22 0932 Allergies  Allergen Reactions   Atorvastatin     leg pain   Rosuvastatin     leg pain   Family History  Problem Relation Age of Onset   Breast cancer Mother 60   Thyroid disease Mother 74   Hypertension Mother    Hyperlipidemia Mother    Lung cancer Mother 40   Kidney disease Mother 81   Heart attack Father 59   Heart disease Father    Aneurysm Father 49   Diabetes Brother    Diabetes Paternal Grandmother    Breast cancer Cousin        two maternal first cousin   Colon polyps Neg Hx    Colon cancer Neg Hx    Esophageal cancer Neg Hx    Rectal cancer Neg Hx    Stomach cancer Neg Hx    Social History   Socioeconomic History   Marital status: Widowed    Spouse name: Not on  file   Number of children: 3   Years of education: Not on file   Highest education level: Not on file  Occupational History   Occupation: retired    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  Tobacco Use   Smoking status: Never   Smokeless tobacco: Never  Vaping Use   Vaping Use: Never used  Substance and Sexual Activity   Alcohol use: Yes    Comment: rarely    Drug use: No   Sexual activity: Never  Other Topics Concern   Not on file  Social History Narrative   Married      3 children; 1 son-suicide      Retired      Geologist, 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   Social Determinants of Radio broadcast assistant Strain: Low Risk  (09/18/2021)    Overall Financial Resource Strain (CARDIA)    Difficulty of Paying Living Expenses: Not hard at all  Food Insecurity: No Food Insecurity (09/18/2021)   Hunger Vital Sign    Worried About Running Out of Food in the Last Year: Never true    [place] in the Last Year: Never true  Transportation Needs: No Transportation Needs (09/18/2021)   PRAPARE - Hydrologist (Medical): No    Lack of Transportation (Non-Medical): No  Physical Activity: Inactive (09/18/2021)   Exercise Vital Sign    Days of Exercise per Week: 0 days    Minutes of Exercise per Session: 0 min  Stress: No Stress Concern Present (09/18/2021)   [place]    Feeling of Stress : Not at all  Social Connections: Moderately Isolated (09/18/2021)   Social Connection and Isolation Panel [NHANES]    Frequency of Communication with Friends and Family: More than three times a week    Frequency of Social Gatherings with Friends and Family: Three times a week    Attends Religious Services: 1 to 4 times per year    Active Member of Clubs or Organizations: No    Attends Archivist Meetings: Never    Marital Status: Widowed  Intimate Partner Violence: Not At Risk (09/18/2021)   Humiliation, Afraid, Rape, and Kick questionnaire    Fear of Current or Ex-Partner: No    Emotionally Abused: No    Physically Abused: No    Sexually Abused: No    Physical Exam: Today's Vitals   07/26/22 0920  BP: 135/76  Pulse: 80  Resp: 12  Temp: 98 F (36.7 C)  TempSrc: Tympanic  SpO2: 95%  Weight: 80.2 kg  Height: '5\' 3"'$  (1.6 m)  PainSc: 0-No pain   Body mass index is 31.32 kg/m. GEN: NAD EYE: Sclerae anicteric ENT: MMM CV: Non-tachycardic GI: Soft, NT/ND NEURO:  Alert & Oriented x 3  Lab Results: No results for input(s): "WBC", "HGB", "HCT", "PLT" in the last 72 hours. BMET No results for input(s): "NA", "K", "CL", "CO2",  "GLUCOSE", "BUN", "CREATININE", "CALCIUM" in the last 72 hours. LFT No results for input(s): "PROT", "ALBUMIN", "AST", "ALT", "ALKPHOS", "BILITOT", "BILIDIR", "IBILI" in the last 72 hours. PT/INR No results for input(s): "LABPROT", "INR" in the last 72 hours.   Impression / Plan: This is a 81 y.o.female  who presents for EGD/EUS for fiducial placement in setting of pancreas cancer and need for SBRT.  The risks of an EUS  including intestinal perforation, bleeding, infection, aspiration, and medication effects were discussed as was the possibility it may not give a definitive diagnosis if a biopsy is performed.  When a biopsy of the pancreas is done as part of the EUS, there is an additional risk of pancreatitis at the rate of about 1-2%.  It was explained that procedure related pancreatitis is typically mild, although it can be severe and even life threatening, which is why we do not perform random pancreatic biopsies and only biopsy a lesion/area we feel is concerning enough to warrant the risk.   The risks and benefits of endoscopic evaluation/treatment were discussed with the patient and/or family; these include but are not limited to the risk of perforation, infection, bleeding, missed lesions, lack of diagnosis, severe illness requiring hospitalization, as well as anesthesia and sedation related illnesses.  The patient's history has been reviewed, patient examined, no change in status, and deemed stable for procedure.  The patient and/or family is agreeable to proceed.    Justice Britain, MD [facility] Office # [phone]

## 2022-07-26 NOTE — Discharge Instructions (Signed)
YOU HAD AN ENDOSCOPIC PROCEDURE TODAY: Refer to the procedure report and other information in the discharge instructions given to you for any specific questions about what was found during the examination. If this information does not answer your questions, please call [place] office at [phone] to clarify.  ° °YOU SHOULD EXPECT: Some feelings of bloating in the abdomen. Passage of more gas than usual. Walking can help get rid of the air that was put into your GI tract during the procedure and reduce the bloating. If you had a lower endoscopy (such as a colonoscopy or flexible sigmoidoscopy) you may notice spotting of blood in your stool or on the toilet paper. Some abdominal soreness may be present for a day or two, also. ° °DIET: Your first meal following the procedure should be a light meal and then it is ok to progress to your normal diet. A half-sandwich or bowl of soup is an example of a good first meal. Heavy or fried foods are harder to digest and may make you feel nauseous or bloated. Drink plenty of fluids but you should avoid alcoholic beverages for 24 hours. If you had a esophageal dilation, please see attached instructions for diet.   ° °ACTIVITY: Your care partner should take you home directly after the procedure. You should plan to take it easy, moving slowly for the rest of the day. You can resume normal activity the day after the procedure however YOU SHOULD NOT DRIVE, use power tools, machinery or perform tasks that involve climbing or major physical exertion for 24 hours (because of the sedation medicines used during the test).  ° °SYMPTOMS TO REPORT IMMEDIATELY: °A gastroenterologist can be reached at any hour. Please call [phone]  for any of the following symptoms:  °Following lower endoscopy (colonoscopy, flexible sigmoidoscopy) °Excessive amounts of blood in the stool  °Significant tenderness, worsening of abdominal pains  °Swelling of the abdomen that is new, acute  °Fever of 100° or  higher  °Following upper endoscopy (EGD, EUS, ERCP, esophageal dilation) °Vomiting of blood or coffee ground material  °New, significant abdominal pain  °New, significant chest pain or pain under the shoulder blades  °Painful or persistently difficult swallowing  °New shortness of breath  °Black, tarry-looking or red, bloody stools ° °FOLLOW UP:  °If any biopsies were taken you will be contacted by phone or by letter within the next 1-3 weeks. Call [phone]  if you have not heard about the biopsies in 3 weeks.  °Please also call with any specific questions about appointments or follow up tests. ° °

## 2022-07-26 NOTE — Op Note (Signed)
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 Estimated Blood Loss:     Estimated blood loss was minimal. Procedure:                Pre-Anesthesia Assessment:                           - Prior to the procedure, a History and Physical                            was performed, and patient medications and                            allergies were reviewed. The patient's tolerance of                            previous anesthesia was also reviewed. The risks                            and benefits of the procedure and the sedation                            options and risks were discussed with the patient.                            All questions were answered, and informed consent                            was obtained. Prior Anticoagulants: The patient has                            taken no anticoagulant or antiplatelet agents. ASA                            Grade Assessment: III - A patient with severe                            systemic disease. After reviewing the risks and  benefits, the patient was deemed in satisfactory                            condition to undergo the procedure.                           After obtaining informed consent, the endoscope was                            passed under direct vision. Throughout the                            procedure, the patient's blood pressure, pulse,  and                            oxygen saturations were monitored continuously. The                            [id number]-[id number] ([id number]) Olympus endoscope was introduced                            through the mouth, and advanced to the second part                            of duodenum. The [id number]-[id number] ([id number]) Olympus                            duodenoscope was introduced through the mouth, and                            advanced to the area of papilla. The [id number]                            ([id number]) Olympus linear ultrasound scope was                            introduced through the mouth, and advanced to the                            duodenum for ultrasound examination from the                            stomach and duodenum. The upper EUS was                            accomplished without difficulty. The patient                            tolerated the procedure. Scope In: Scope Out: Findings:      ENDOSCOPIC FINDING: :      No gross lesions were noted in the entire esophagus.      The Z-line was regular and was found 34 cm from the incisors.      A non-obstructing Schatzki ring was found at the gastroesophageal  junction.      A 3 cm hiatal hernia was present.      Multiple dispersed small erosions with no bleeding and no stigmata of       recent bleeding were found in the entire examined stomach. Biopsies were       taken with a cold forceps for histology and Helicobacter pylori testing.      No gross lesions were noted in the duodenal bulb, in the first portion       of the duodenum and in the second portion of the duodenum.      The major papilla was normal.      ENDOSONOGRAPHIC FINDING: :      An irregular mass-like area was identified in the pancreatic head/genu.       The mass-like area was hypoechoic with cystic degeneration/change       throughout the head. The area measured at least 25 mm by 22 mm in       maximal cross-sectional diameter. The endosonographic borders  were       poorly-defined. There was sonographic evidence suggesting invasion into       the superior mesenteric artery (manifested by encasement). An intact       interface was seen between the mass and the celiac trunk suggesting a       lack of invasion. The remainder of the pancreas was examined. The       endosonographic appearance of parenchyma and the upstream pancreatic       duct indicated duct dilation and parenchymal atrophy. Fiducial marker       placement was performed with difficult placement due to the location of       the lesion. Once the target lesion pancreatic head/neck was identified a       preloaded Medtronic marker in a 22 gauge needle was then deployed in and       around the lesion (in both the [place] 3 areas). This was       repeated for a total of four markers.      There were numerous portal venous collaterals present adjacent to the       portal vein, based on endosonographic examination. Impression:               EGD Impression:                           - No gross lesions in the entire esophagus. Z-line                            regular, 34 cm from the incisors. Non-obstructing                            Schatzki ring.                           - 3 cm hiatal hernia.                           - Erosive gastropathy with no bleeding and no                            stigmata of recent bleeding. Biopsied.                           -  No gross lesions in the duodenal bulb, in the                            first portion of the duodenum and in the second                            portion of the duodenum.                           - Normal major papilla.                           EUS Impression:                           - A mass was identified in the pancreatic                            head/genu. A tissue diagnosis was obtained prior to                            this exam. This is consistent with adenocarcinoma.                             Fiducial markers were deployed after.                           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 Moderate Sedation:      Not Applicable - Patient had care per Anesthesia. Recommendation:           - The patient will be observed post-procedure,                            until all discharge criteria are met.                           - Discharge patient to home.                           - Patient has a contact number available for                            emergencies. The signs and symptoms of potential                            delayed complications were discussed with the                            patient. Return to normal activities tomorrow.                            Written discharge instructions were provided to the  patient.                           - Low fat diet for 1 week.                           - Start Omeprazole 20 mg daily.                           - Await path results.                           - Monitor for signs/symptoms of bleeding,                            perforation, and infection. If issues please call                            our number to get further assistance as needed.                           - The findings and recommendations were discussed                            with the patient.                           - The findings and recommendations were discussed                            with the designated responsible adult. Procedure Code(s):        --- Professional ---                           [phone], Esophagogastroduodenoscopy, flexible,                            transoral; with transendoscopic ultrasound-guided                            transmural injection of diagnostic or therapeutic                            substance(s) (eg, anesthetic, neurolytic agent) or                            fiducial marker(s) (includes endoscopic  ultrasound                            examination of the esophagus, stomach, and either                            the duodenum or a surgically altered stomach where                            the jejunum is examined distal to the anastomosis)  Diagnosis Code(s):        --- Professional ---                           K22.2, Esophageal obstruction                           K44.9, Diaphragmatic hernia without obstruction or                            gangrene                           K31.89, Other diseases of stomach and duodenum                           K86.89, Other specified diseases of pancreas                           I87.8, Other specified disorders of veins                           C25.9, Malignant neoplasm of pancreas, unspecified CPT copyright 2022 American Medical Association. All rights reserved. The codes documented in this report are preliminary and upon coder review may  be revised to meet current compliance requirements. Justice Britain, MD 07/26/2022 11:04:55 AM Number of Addenda: 0

## 2022-07-27 NOTE — Progress Notes (Incomplete)
Histology and Location of Primary Cancer:  Left breast cancer  Location(s) of Symptomatic tumor(s): Pancreas  Past/Anticipated chemotherapy by medical oncology, if any: Per Dr. Burr Medico  Surgical intervention: Performed by Dr. Rush Landmark on 07/25/22    Patient's main complaints related to symptomatic tumor(s) are: Patient reports having left side pain, patient states she has not had a bowel movement in 5 days. Encouraged patient to start taking Miralax daily. Patient voiced understanding.   Pain on a scale of 0-10 is: Patient reports pain worsens at night, Rates pain at 7/10. Patient taking hydrocodone-apap 10-'325mg'$  q 6 prn.    Nausea/Vomiting: Yes. Nausea.    SAFETY ISSUES: Prior radiation? Yes  Pacemaker/ICD? No Possible current pregnancy? No Is the patient on methotrexate? No  Additional Complaints / other details:  Patient is requesting to update code status to DNR.    BP 121/85 (BP Location: Right Arm, Patient Position: Sitting, Cuff Size: Normal)   Pulse 95   Temp (!) 97.4 F (36.3 C)   Resp 18   Ht '5\' 3"'$  (1.6 m)   Wt 176 lb 3.2 oz (79.9 kg)   SpO2 94%   BMI 31.21 kg/m

## 2022-07-27 NOTE — Progress Notes (Signed)
Radiation Oncology         [phone] ________________________________  Name: Jessica Livingston MRN: [id number]  Date: 07/28/2022  DOB: May 15, 1941  Follow-Up Visit Note  CC: Deland Pretty, MD  Truitt Merle, MD  No diagnosis found.  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 Since her last visit, the patient has had several hospital encounters, detailed as follows:  -- 01/27/22 ED: Patient presented for evaluation of intermittent LUQ abdominal pain x 1 month. Given negative work up and absence of associated symptoms, she was advised to try and apply heat to the affected area and tylenol to determine if her pain was musculoskeletal in etiology.  - Her abdominal pain persisted prompting a CT of the abdomen on 03/04/22 which revealed a pancreatic lesion measuring 2.5 cm suspicious for adenocarcinoma. The lesion was also seen with resultant upstream atrophy and duct dilatation.  -- 03/11/22 ED: the patient returned to the ED with new onset nausea and vomiting. She also endorsed persistent upper abdominal pain and back pain for the past 2 months. CT scan performed showed stability of the pancreatic mass and pancreatic duct dilation.  No bowel obstruction was appreciated. GI was consulted who recommended EUS and EGD however the patient ate that morning and endorsed UTI symptoms.   Following discharge,  the patient accordingly returned for inpatient EGD and biopsies of the pancreatic lesion on 03/18/22 under the care of Dr. Ardis Hughs (GI). FNA of pancreas on 03/18/22 showed malignant cells consistent with adenocarcinoma.   Genetic testing collected on 03/24/22 showed no clinically significant variants detected by +RNAinsight testing.  Staging chest CT on 03/30/22 showed 4 subcentimeter pulmonary nodules in both lungs, the largest of which measuring 9 mm, suspicious for pulmonary metastases. No thoracic adenopathy was appreciated.   Accordingly, the patient was referred to Dr. Burr Medico and opted for port placement on 03/31/22 in anticipation of beginning chemotherapy consisting of abraxane/gemcitabine. She received her first infusion on 04/02/22. She initially tolerated chemotherapy well other than diarrhea, mild fatigue, and low appetite. However, she later developed progressive fatigue, dizziness and weight loss prompting her to discontinue chemo early and move on to SBRT. Dr. Burr Medico has informed her that she will likely have cancer progression after radiation at some point, and that she can restart chemo if and when cancer progression occurs.    Her most recent CT CAP on 06/24/22 showed a good response to neoadjuvant treatment, detailed by: resolution of previously seen pulmonary nodules; a slight interval decrease in size of the pancreatic mass at the site of ductal transition in the neck of the pancreas; decreased soft tissue in the root of the small bowel mesentery and adjacent cystic changes; and a slight decrease in size of soft tissue tracking into the root of the small bowel mesentery adjacent to the SMA and abutting the SMA;   The patient was also referred to Dr. Zenia Resides (General Surgery) on  04/12/22 for consideration of surgical resectioning. Given concerns of metastatic disease in the chest, and recent labs showing significantly elevated CA19-9 concerning for micrometastatic disease, Dr. Zenia Resides advised  the patient to continue with neoadjuvant chemotherapy followed by repeat assessment to determine the need for surgical intervention. Given that the patient has opted to discontinue neoadjuvant treatment early and proceed with SBRT, she will likely follow-up with general surgery following radiation for reassessment.   The patient was initially scheduled to meet with me on 07/26/22. However, GI arranged for her to have an EUS for fiducial marker placement and biopsies of the pancreatic mass yesterday. Pathology is pending at this time. ***   Allergies:  is allergic to atorvastatin and rosuvastatin.  Meds: Current Outpatient Medications  Medication Sig Dispense Refill   Besifloxacin HCl (BESIVANCE) 0.6 % SUSP Place 1 drop into both eyes See admin instructions. Instill 1 drop into both eyes 3 times daily once a month on the day of monthly eye injections     ciprofloxacin (CIPRO) 500 MG tablet Take 1 tablet (500 mg total) by mouth 2 (two) times daily for 3 days. 6 tablet 0   HYDROcodone-acetaminophen (NORCO) 10-325 MG tablet Take 1 tablet by mouth every 6 (six) hours as needed for severe pain or moderate pain. 30 tablet 0   lipase/protease/amylase (CREON) 36000 UNITS CPEP capsule Take 1 capsule (36,000 Units total) by mouth 3 (three) times daily with meals AND 1 capsule (36,000 Units total) 3 (three) times daily before meals. (Patient not taking: Reported on 07/19/2022) 60 capsule 0   omeprazole (PRILOSEC OTC) 20 MG tablet Take 1 tablet (20 mg total) by mouth daily. 28 tablet 1   ondansetron (ZOFRAN) 8 MG tablet Take 1 tablet (8 mg total) by mouth every 8 (eight) hours as needed for nausea or vomiting. 30 tablet 1   ondansetron (ZOFRAN-ODT) 4 MG disintegrating tablet Take 1 tablet (4 mg total) by mouth every 8 (eight) hours as needed for nausea or vomiting. (Patient not taking: Reported on 07/19/2022) 20 tablet 0   prochlorperazine (COMPAZINE) 10 MG tablet Take 1 tablet (10 mg total) by mouth every 6  (six) hours as needed. (Patient not taking: Reported on 07/19/2022) 30 tablet 2   No current facility-administered medications for this encounter.    Physical Findings: The patient is in no acute distress. Patient is alert and oriented.  vitals were not taken for this visit. .  No significant changes. Lungs are clear to auscultation bilaterally. Heart has regular rate and rhythm. No palpable cervical, supraclavicular, or axillary adenopathy. Abdomen soft, non-tender, normal bowel sounds.  Right Breast: no palpable mass, nipple discharge or bleeding. Left Breast: ***  Lab Findings: Lab Results  Component Value Date   WBC 3.7 (L) 06/25/2022   HGB 11.7 (L) 06/25/2022   HCT 34.9 (L) 06/25/2022   MCV 99.1 06/25/2022   PLT 209 06/25/2022    Radiographic Findings: No results found.  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  ***  Plan:  ***   *** minutes of total time was spent for this patient encounter, including preparation, face-to-face counseling with the patient and coordination of care, physical exam, and documentation of the encounter. ____________________________________  Blair Promise, PhD, MD  This document serves as a record of  services personally performed by Gery Pray, MD. It was created on his behalf by Roney Mans, a trained medical scribe. The creation of this record is based on the scribe's personal observations and the provider's statements to them. This document has been checked and approved by the attending provider.

## 2022-07-28 ENCOUNTER — Encounter: Payer: Self-pay | Admitting: Radiation Oncology

## 2022-07-28 ENCOUNTER — Ambulatory Visit
Admission: RE | Admit: 2022-07-28 | Discharge: 2022-07-28 | Disposition: A | Discharge status: Home / Self Care | Payer: PPO | Source: Ambulatory Visit | Attending: Radiation Oncology | Admitting: Radiation Oncology

## 2022-07-28 ENCOUNTER — Encounter: Payer: Self-pay | Admitting: Gastroenterology

## 2022-07-28 ENCOUNTER — Other Ambulatory Visit: Payer: Self-pay

## 2022-07-28 VITALS — BP 121/85 | HR 95 | Temp 97.4°F | Resp 18 | Ht 63.0 in | Wt 176.2 lb

## 2022-07-28 DIAGNOSIS — Z923 Personal history of irradiation: Secondary | ICD-10-CM | POA: Insufficient documentation

## 2022-07-28 DIAGNOSIS — Z9221 Personal history of antineoplastic chemotherapy: Secondary | ICD-10-CM | POA: Diagnosis not present

## 2022-07-28 DIAGNOSIS — D0512 Intraductal carcinoma in situ of left breast: Secondary | ICD-10-CM | POA: Diagnosis not present

## 2022-07-28 DIAGNOSIS — Z79899 Other long term (current) drug therapy: Secondary | ICD-10-CM | POA: Diagnosis not present

## 2022-07-28 DIAGNOSIS — R978 Other abnormal tumor markers: Secondary | ICD-10-CM | POA: Diagnosis not present

## 2022-07-28 DIAGNOSIS — C259 Malignant neoplasm of pancreas, unspecified: Secondary | ICD-10-CM

## 2022-07-28 DIAGNOSIS — Z86000 Personal history of in-situ neoplasm of breast: Secondary | ICD-10-CM | POA: Insufficient documentation

## 2022-07-28 DIAGNOSIS — Z170 Estrogen receptor positive status [ER+]: Secondary | ICD-10-CM | POA: Diagnosis not present

## 2022-07-28 DIAGNOSIS — Z79890 Hormone replacement therapy (postmenopausal): Secondary | ICD-10-CM | POA: Diagnosis not present

## 2022-07-28 DIAGNOSIS — R918 Other nonspecific abnormal finding of lung field: Secondary | ICD-10-CM | POA: Insufficient documentation

## 2022-07-28 DIAGNOSIS — K8689 Other specified diseases of pancreas: Secondary | ICD-10-CM

## 2022-07-28 LAB — SURGICAL PATHOLOGY

## 2022-07-30 ENCOUNTER — Other Ambulatory Visit: Payer: PPO

## 2022-07-30 ENCOUNTER — Ambulatory Visit: Payer: PPO | Admitting: Hematology

## 2022-07-30 ENCOUNTER — Inpatient Hospital Stay: Payer: PPO

## 2022-07-30 ENCOUNTER — Ambulatory Visit: Payer: PPO

## 2022-07-30 ENCOUNTER — Inpatient Hospital Stay: Payer: PPO | Admitting: Hematology

## 2022-08-02 ENCOUNTER — Encounter (INDEPENDENT_AMBULATORY_CARE_PROVIDER_SITE_OTHER): Payer: PPO | Admitting: Ophthalmology

## 2022-08-02 DIAGNOSIS — H43813 Vitreous degeneration, bilateral: Secondary | ICD-10-CM | POA: Diagnosis not present

## 2022-08-02 DIAGNOSIS — H353231 Exudative age-related macular degeneration, bilateral, with active choroidal neovascularization: Secondary | ICD-10-CM | POA: Diagnosis not present

## 2022-08-03 ENCOUNTER — Other Ambulatory Visit: Payer: Self-pay

## 2022-08-03 ENCOUNTER — Ambulatory Visit
Admission: RE | Admit: 2022-08-03 | Discharge: 2022-08-03 | Disposition: A | Discharge status: Home / Self Care | Payer: PPO | Source: Ambulatory Visit | Attending: Radiation Oncology | Admitting: Radiation Oncology

## 2022-08-03 DIAGNOSIS — D0512 Intraductal carcinoma in situ of left breast: Secondary | ICD-10-CM | POA: Diagnosis not present

## 2022-08-03 DIAGNOSIS — C259 Malignant neoplasm of pancreas, unspecified: Secondary | ICD-10-CM | POA: Diagnosis not present

## 2022-08-03 DIAGNOSIS — Z170 Estrogen receptor positive status [ER+]: Secondary | ICD-10-CM | POA: Diagnosis not present

## 2022-08-03 DIAGNOSIS — Z510 Encounter for antineoplastic radiation therapy: Secondary | ICD-10-CM | POA: Insufficient documentation

## 2022-08-03 DIAGNOSIS — C250 Malignant neoplasm of head of pancreas: Secondary | ICD-10-CM | POA: Insufficient documentation

## 2022-08-03 DIAGNOSIS — Z9221 Personal history of antineoplastic chemotherapy: Secondary | ICD-10-CM | POA: Diagnosis not present

## 2022-08-03 DIAGNOSIS — Z86000 Personal history of in-situ neoplasm of breast: Secondary | ICD-10-CM | POA: Diagnosis not present

## 2022-08-03 DIAGNOSIS — R918 Other nonspecific abnormal finding of lung field: Secondary | ICD-10-CM | POA: Diagnosis not present

## 2022-08-03 DIAGNOSIS — Z923 Personal history of irradiation: Secondary | ICD-10-CM | POA: Diagnosis not present

## 2022-08-11 DIAGNOSIS — Z510 Encounter for antineoplastic radiation therapy: Secondary | ICD-10-CM | POA: Diagnosis not present

## 2022-08-11 DIAGNOSIS — Z170 Estrogen receptor positive status [ER+]: Secondary | ICD-10-CM | POA: Diagnosis not present

## 2022-08-11 DIAGNOSIS — D0512 Intraductal carcinoma in situ of left breast: Secondary | ICD-10-CM | POA: Diagnosis not present

## 2022-08-11 DIAGNOSIS — C259 Malignant neoplasm of pancreas, unspecified: Secondary | ICD-10-CM | POA: Diagnosis not present

## 2022-08-13 ENCOUNTER — Ambulatory Visit: Payer: PPO

## 2022-08-13 ENCOUNTER — Other Ambulatory Visit: Payer: PPO

## 2022-08-13 ENCOUNTER — Ambulatory Visit: Payer: PPO | Admitting: Hematology

## 2022-08-16 ENCOUNTER — Other Ambulatory Visit: Payer: Self-pay

## 2022-08-16 ENCOUNTER — Ambulatory Visit
Admission: RE | Admit: 2022-08-16 | Discharge: 2022-08-16 | Disposition: A | Discharge status: Home / Self Care | Payer: PPO | Source: Ambulatory Visit | Attending: Radiation Oncology | Admitting: Radiation Oncology

## 2022-08-16 DIAGNOSIS — C259 Malignant neoplasm of pancreas, unspecified: Secondary | ICD-10-CM

## 2022-08-16 DIAGNOSIS — Z510 Encounter for antineoplastic radiation therapy: Secondary | ICD-10-CM | POA: Diagnosis not present

## 2022-08-16 LAB — RAD ONC ARIA SESSION SUMMARY
Course Elapsed Days: 0
Plan Fractions Treated to Date: 1
Plan Prescribed Dose Per Fraction: 6.6 Gy
Plan Total Fractions Prescribed: 5
Plan Total Prescribed Dose: 33 Gy
Reference Point Dosage Given to Date: 6.6 Gy
Reference Point Session Dosage Given: 6.6 Gy
Session Number: 1

## 2022-08-17 ENCOUNTER — Ambulatory Visit: Payer: PPO | Admitting: Radiation Oncology

## 2022-08-18 ENCOUNTER — Ambulatory Visit
Admission: RE | Admit: 2022-08-18 | Discharge: 2022-08-18 | Disposition: A | Discharge status: Home / Self Care | Payer: PPO | Source: Ambulatory Visit | Attending: Radiation Oncology | Admitting: Radiation Oncology

## 2022-08-18 ENCOUNTER — Other Ambulatory Visit: Payer: Self-pay

## 2022-08-18 DIAGNOSIS — Z510 Encounter for antineoplastic radiation therapy: Secondary | ICD-10-CM | POA: Diagnosis not present

## 2022-08-18 DIAGNOSIS — C259 Malignant neoplasm of pancreas, unspecified: Secondary | ICD-10-CM

## 2022-08-18 LAB — RAD ONC ARIA SESSION SUMMARY
Course Elapsed Days: 2
Plan Fractions Treated to Date: 2
Plan Prescribed Dose Per Fraction: 6.6 Gy
Plan Total Fractions Prescribed: 5
Plan Total Prescribed Dose: 33 Gy
Reference Point Dosage Given to Date: 13.2 Gy
Reference Point Session Dosage Given: 6.6 Gy
Session Number: 2

## 2022-08-19 ENCOUNTER — Other Ambulatory Visit: Payer: Self-pay

## 2022-08-19 ENCOUNTER — Ambulatory Visit: Payer: PPO | Admitting: Radiation Oncology

## 2022-08-19 DIAGNOSIS — C250 Malignant neoplasm of head of pancreas: Secondary | ICD-10-CM

## 2022-08-19 NOTE — Progress Notes (Signed)
[facility]   [phone] [phone]   Clinic Follow up Note   Patient Care Team: Deland Pretty, MD as PCP - General (Internal Medicine) Mauro Kaufmann, RN as Oncology Nurse Navigator Rockwell Germany, RN as Oncology Nurse Navigator Jovita Kussmaul, MD as Consulting Physician (General Surgery) Nicholas Lose, MD as Consulting Physician (Hematology and Oncology) Gery Pray, MD as Consulting Physician (Radiation Oncology) Delice Bison, Charlestine Massed, NP as Nurse Practitioner (Hematology and Oncology) Harmon Pier, RN as Registered Nurse Truitt Merle, MD as Consulting Physician (Oncology) Royston Bake, RN as Oncology Nurse Navigator (Oncology)  Date of Service:  08/20/2022  CHIEF COMPLAINT: f/u of  pancreatic cancer   CURRENT THERAPY:  SBRT   ASSESSMENT:  Jessica Livingston is a 81 y.o. female with   Ductal carcinoma in situ (DCIS) of left breast -diagnosed in 01/2021. S/p lumpectomy 04/16/21, path showed DCIS. Treated with adjuvant radiation 05/22/21 - 06/18/21. She declined antiestrogen therapy.     Pancreatic cancer ([place]) cT2N1Mx, with indeterminate lung nodules -diagnosed 02/2022 by EUS/FNA. Staging CT showed sub-cm lung nodules. 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 -she began abraxane/gemcitabine every 2 weeks on 04/02/22. She has had good clinical response. -restaging CT CAP 06/24/22 showed overall good response to therapy.  -her accumulating side effects from chemo are becoming hard for her, and she would like to stop chemo.  -she started consolidation SBRT 08/13/2022, she is tolerating moderate well with some gastric discomfort  -plan to repeat scan in 3 month to evaluate response to radiation  -We reviewed symptom management.   PLAN: -Lab reviewed, tumor Marker Pending -I refill Hydrocodone -lab,flush, and f/u in 6wks -Restaging CT scan in 3 months.    SUMMARY OF ONCOLOGIC HISTORY: Oncology History  Ductal  carcinoma in situ (DCIS) of left breast  02/17/2021 Initial Diagnosis   Screening mammogram detected left breast calcifications lower inner quadrant 0.9 cm: Biopsy revealed high-grade DCIS with necrosis and calcifications, ER 30%, PR 50% weak   02/25/2021 Cancer Staging   Staging form: Breast, AJCC 8th Edition - Clinical stage from 02/25/2021: Stage 0 (cTis (DCIS), cN0, cM0, G3, ER+, PR+, HER2: Not Assessed) - Signed by Nicholas Lose, MD on 02/25/2021 Stage prefix: Initial diagnosis Histologic grading system: 3 grade system   04/16/2021 Cancer Staging   Staging form: Breast, AJCC 8th Edition - Pathologic stage from 04/16/2021: Stage 0 (pTis (DCIS), pN0, cM0, ER+, PR+) - Signed by Gardenia Phlegm, NP on 09/04/2021 Stage prefix: Initial diagnosis Nuclear grade: G3   04/16/2021 Surgery   Left breast lumpectomy with radioactive seed localization   05/21/2021 - 06/18/2021 Radiation Therapy    Radiation Treatment Dates: 05/21/2021 through 06/18/2021 Site Technique Total Dose (Gy) Dose per Fx (Gy) Completed Fx Beam Energies  Breast, Left: Breast_Lt 3D 40.05/40.05 2.67 15/15 6X, 10X  Breast, Left: Breast_Lt_Bst 3D 10/10 2 5/5 6X    Pancreatic cancer ([place])  03/04/2022 Imaging   CT ABDOMEN W CONTRAST   IMPRESSION: 1. Findings highly suspicious for adenocarcinoma in the pancreatic head with resultant upstream atrophy and duct dilatation. Venous encasement as detailed above. No arterial involvement. 2. Suspect cirrhosis and mild steatosis. 3. Coronary artery atherosclerosis. Aortic Atherosclerosis (ICD10-I70.0).   03/09/2022 Tumor Marker   Patient's tumor was tested for the following markers: CA-19.9. Results of the tumor marker test revealed 1715.   03/18/2022 Procedure   EUS-Dr. Ardis Hughs  1. Irregularly shaped, indistinctly bordered, heterogeneous, hypoechoic mass that measures 3.4 cm maximally  in the uncinate/head of pancreas. The mass clearly surrounds and attenuates the portal  vein near the splenic vein, SMV confluence. The mass is causing mild biliary duct obstruction with the common bile duct measuring 8.6 mm. The mass is also obstructing and dilating the main pancreatic duct which measures up to 9 millimeters in the body and tail. I used a 25-gauge EUS FNB needle to sample the lesion with a transduodenal approach, 2 passes. 2. No peripancreatic adenopathy 3. Limited views of the liver, spleen were normal   03/18/2022 Pathology Results   CYTOLOGY - NON PAP  CASE: [id number]  PATIENT: Jessica Livingston  Non-Gynecological Cytology Report   Clinical History: Cirrhosis, pancreatic mass  Specimen Submitted:  A. PANCREAS, HEAD, FINE NEEDLE ASPIRATION:    FINAL MICROSCOPIC DIAGNOSIS:  - Malignant cells consistent with adenocarcinoma   SPECIMEN ADEQUACY:  Satisfactory for evaluation   IMMEDIATE EVALUATION:  SUFFICIENT, POSITIVE FOR MALIGNANCY CONSISTENT WITH ADENO CA. (MPL)    03/18/2022 Cancer Staging   Staging form: Exocrine Pancreas, AJCC 8th Edition - Clinical stage from 03/18/2022: Stage IV (cT2, cN1, cM1) - Signed by Truitt Merle, MD on 04/02/2022   03/23/2022 Initial Diagnosis   Pancreatic cancer ([place])   04/02/2022 - 04/16/2022 Chemotherapy   Patient is on Treatment Plan : PANCREATIC Abraxane / Gemcitabine D1,15 q28d      Genetic Testing   [facility] CancerNext-Expanded Panel was Negative. Report date is 04/03/2022.  The CancerNext-Expanded gene panel offered by [facility] and includes sequencing, rearrangement, and RNA analysis for the following 77 genes: AIP, ALK, APC, ATM, AXIN2, BAP1, BARD1, BLM, BMPR1A, BRCA1, BRCA2, BRIP1, CDC73, CDH1, CDK4, CDKN1B, CDKN2A, CHEK2, CTNNA1, DICER1, FANCC, FH, FLCN, GALNT12, KIF1B, LZTR1, MAX, MEN1, MET, MLH1, MSH2, MSH3, MSH6, MUTYH, NBN, NF1, NF2, NTHL1, PALB2, PHOX2B, PMS2, POT1, PRKAR1A, PTCH1, PTEN, RAD51C, RAD51D, RB1, RECQL, RET, SDHA, SDHAF2, SDHB, SDHC, SDHD, SMAD4, SMARCA4, SMARCB1, SMARCE1, STK11, SUFU,  TMEM127, TP53, TSC1, TSC2, VHL and XRCC2 (sequencing and deletion/duplication); EGFR, EGLN1, HOXB13, KIT, MITF, PDGFRA, POLD1, and POLE (sequencing only); EPCAM and GREM1 (deletion/duplication only).    04/02/2022 -  Chemotherapy   Patient is on Treatment Plan : PANCREATIC Abraxane D1,8,15 + Gemcitabine D1,8,15 q28d        INTERVAL HISTORY:  Jessica Livingston is here for a follow up of   pancreatic cancer She was last seen by me on  11/10/2023She presents to the clinic accompanied by daughter. Pt reports that radiation cause her to vomit.Pt states Radiation is a little easier the chemo. Pt is asking about her pain medication.    All other systems were reviewed with the patient and are negative.  MEDICAL HISTORY:  Past Medical History:  Diagnosis Date   breast cancer 2022   Left   CAD (coronary artery disease)    Cataract 2018   had sx   Depression    Family history of breast cancer    Heart disease '   History of radiation therapy    Left breast 05/21/21-06/18/21- Dr. Gery Pray   HLD (hyperlipidemia)    Obesity, unspecified    Other abnormal glucose    Other B-complex deficiencies    Other seborrheic keratosis    Pancreatic cancer ([place]) 02/2022   Postmenopausal     SURGICAL HISTORY: Past Surgical History:  Procedure Laterality Date   BIOPSY  07/26/2022   Procedure: BIOPSY;  Surgeon: Irving Copas., MD;  Location: [facility];  Service: Gastroenterology;;   BREAST LUMPECTOMY WITH RADIOACTIVE SEED LOCALIZATION Left 04/16/2021  Procedure: LEFT BREAST LUMPECTOMY WITH RADIOACTIVE SEED LOCALIZATION;  Surgeon: Jovita Kussmaul, MD;  Location: [facility];  Service: General;  Laterality: Left;   CARDIAC CATHETERIZATION  5/03   mild to mod single vessel disease   CHOLECYSTECTOMY     COLONOSCOPY  2018   HPP/TA   ESOPHAGOGASTRODUODENOSCOPY (EGD) WITH PROPOFOL N/A 03/18/2022   Procedure: ESOPHAGOGASTRODUODENOSCOPY (EGD) WITH PROPOFOL;  Surgeon: [name], MD;  Location: [facility];  Service: Gastroenterology;  Laterality: N/A;   ESOPHAGOGASTRODUODENOSCOPY (EGD) WITH PROPOFOL N/A 07/26/2022   Procedure: ESOPHAGOGASTRODUODENOSCOPY (EGD) WITH PROPOFOL;  Surgeon: Rush Landmark Telford Nab., MD;  Location: [facility];  Service: Gastroenterology;  Laterality: N/A;   EUS N/A 03/18/2022   Procedure: ESOPHAGEAL ENDOSCOPIC ULTRASOUND (EUS) RADIAL;  Surgeon: [name], MD;  Location: [facility];  Service: Gastroenterology;  Laterality: N/A;   EUS N/A 07/26/2022   Procedure: UPPER ENDOSCOPIC ULTRASOUND (EUS) LINEAR;  Surgeon: Irving Copas., MD;  Location[facility];  Service: Gastroenterology;  Laterality: N/A;   FIDUCIAL MARKER PLACEMENT N/A 07/26/2022   Procedure: FIDUCIAL MARKER PLACEMENT;  Surgeon: Irving Copas., MD;  Location: [facility];  Service: Gastroenterology;  Laterality: N/A;   FINE NEEDLE ASPIRATION N/A 03/18/2022   Procedure: FINE NEEDLE ASPIRATION (FNA) LINEAR;  Surgeon: [name], MD;  [facility];  Service: Gastroenterology;  Laterality: N/A;   IR IMAGING GUIDED PORT INSERTION  03/31/2022   LAPAROSCOPIC GASTRIC BANDING  01/13/2010   NECK SURGERY     POLYPECTOMY  2018   TONSILLECTOMY     TUBAL LIGATION Bilateral     I have reviewed the social history and family history with the patient and they are unchanged from previous note.  ALLERGIES:  is allergic to atorvastatin and rosuvastatin.  MEDICATIONS:  Current Outpatient Medications  Medication Sig Dispense Refill   Besifloxacin HCl (BESIVANCE) 0.6 % SUSP Place 1 drop into both eyes See admin instructions. Instill 1 drop into both eyes 3 times daily once a month on the day of monthly eye injections     Cholecalciferol (VITAMIN D) 50 MCG (2000 UT) CAPS Take 50 capsules by mouth once.     cyanocobalamin (VITAMIN B12) 1000 MCG tablet Take 1,000 mcg by mouth daily.     HYDROcodone-acetaminophen (NORCO) 10-325 MG tablet Take 1 tablet by mouth every 6 (six)  hours as needed.     levothyroxine (SYNTHROID) 50 MCG tablet Take 50 mcg by mouth daily before breakfast.     lipase/protease/amylase (CREON) 36000 UNITS CPEP capsule Take 1 capsule (36,000 Units total) by mouth 3 (three) times daily with meals AND 1 capsule (36,000 Units total) 3 (three) times daily before meals. (Patient not taking: Reported on 07/28/2022) 60 capsule 0   omeprazole (PRILOSEC) 20 MG capsule Take 20 mg by mouth daily.     ondansetron (ZOFRAN) 8 MG tablet Take 1 tablet (8 mg total) by mouth every 8 (eight) hours as needed for nausea or vomiting. 30 tablet 1   ondansetron (ZOFRAN-ODT) 4 MG disintegrating tablet Take 1 tablet (4 mg total) by mouth every 8 (eight) hours as needed for nausea or vomiting. 20 tablet 0   prochlorperazine (COMPAZINE) 10 MG tablet Take 1 tablet (10 mg total) by mouth every 6 (six) hours as needed. (Patient not taking: Reported on 07/19/2022) 30 tablet 2   No current facility-administered medications for this visit.    PHYSICAL EXAMINATION: ECOG PERFORMANCE STATUS: 1 - Symptomatic but completely ambulatory  There were no vitals filed for this visit. Wt  Readings from Last 3 Encounters:  07/28/22 176 lb 3.2 oz (79.9 kg)  07/26/22 176 lb 12.9 oz (80.2 kg)  07/09/22 176 lb 14.4 oz (80.2 kg)     GENERAL:alert, no distress and comfortable SKIN: skin color normal, no rashes or significant lesions EYES: normal, Conjunctiva are pink and non-injected, sclera clear  NEURO: alert & oriented x 3 with fluent speech  LABORATORY DATA:  I have reviewed the data as listed    Latest Ref Rng & Units 08/20/2022    1:07 PM 06/25/2022    8:29 AM 06/11/2022   12:07 PM  CBC  WBC 4.0 - 10.5 K/uL 5.7  3.7  3.2   Hemoglobin 12.0 - 15.0 g/dL 14.5  11.7  12.4   Hematocrit 36.0 - 46.0 % 43.3  34.9  37.5   Platelets 150 - 400 K/uL 197  209  213         Latest Ref Rng & Units 06/25/2022    8:29 AM 06/11/2022   12:07 PM 05/28/2022   11:10 AM  CMP  Glucose 70 - 99  mg/dL 149  124  133   BUN 8 - 23 mg/dL _0 Creatinine 0.44 - 1.00 mg/dL 0.54  0.58  0.54   Sodium 135 - 145 mmol/L 138  138  139   Potassium 3.5 - 5.1 mmol/L 4.1  4.1  3.8   Chloride 98 - 111 mmol/L 104  104  105   CO2 22 - 32 mmol/L _1 Calcium 8.9 - 10.3 mg/dL 8.5  8.7  8.6   Total Protein 6.5 - 8.1 g/dL 6.4  6.7  6.6   Total Bilirubin 0.3 - 1.2 mg/dL 0.6  0.6  0.6   Alkaline Phos 38 - 126 U/L 90  93  91   AST 15 - 41 U/L 29  33  42   ALT 0 - 44 U/L 21  29  37       RADIOGRAPHIC STUDIES: I have personally reviewed the radiological images as listed and agreed with the findings in the report. No results found.    Orders Placed This Encounter  Procedures   CT ABD PELVIS W/WO CM ONCOLOGY PANCREATIC PROTOCOL    Standing Status:   Future    Standing Expiration Date:   08/21/2023    Order Specific Question:   If indicated for the ordered procedure, I authorize the administration of contrast media per Radiology protocol    Answer:   Yes    Order Specific Question:   Does the patient have a contrast media/X-ray dye allergy?    Answer:   No    Order Specific Question:   Preferred imaging location?    Answer:   [facility]    Order Specific Question:   Release to patient    Answer:   Manual release only [2]    Order Specific Question:   Reason for preventing immediate release    Answer:   Reasonable likelihood of causing patient harm [2]    Order Specific Question:   Additional details for preventing immediate release    Answer:   Dr. Burr Medico will go over results with pt in clinic    Order Specific Question:   Is Oral Contrast requested for this exam?    Answer:   Yes, Per Radiology protocol    Order Specific Question:   Call Results- Best Contact Number?    Answer:  [phone]   All questions were answered. The patient knows to call the clinic with any problems, questions or concerns. No barriers to learning was detected. The total time spent in the  appointment was 30 minutes.     Truitt Merle, MD 08/20/2022   Felicity Coyer, CMA, am acting as scribe for Truitt Merle, MD.   I have reviewed the above documentation for accuracy and completeness, and I agree with the above.

## 2022-08-20 ENCOUNTER — Other Ambulatory Visit: Payer: Self-pay

## 2022-08-20 ENCOUNTER — Encounter: Payer: Self-pay | Admitting: Hematology

## 2022-08-20 ENCOUNTER — Ambulatory Visit
Admission: RE | Admit: 2022-08-20 | Discharge: 2022-08-20 | Disposition: A | Discharge status: Home / Self Care | Payer: PPO | Source: Ambulatory Visit | Attending: Radiation Oncology | Admitting: Radiation Oncology

## 2022-08-20 ENCOUNTER — Inpatient Hospital Stay: Payer: PPO | Attending: Hematology

## 2022-08-20 ENCOUNTER — Inpatient Hospital Stay: Payer: PPO | Admitting: Hematology

## 2022-08-20 VITALS — BP 142/86 | HR 98 | Temp 98.3°F | Wt 167.7 lb

## 2022-08-20 DIAGNOSIS — R918 Other nonspecific abnormal finding of lung field: Secondary | ICD-10-CM | POA: Insufficient documentation

## 2022-08-20 DIAGNOSIS — D0512 Intraductal carcinoma in situ of left breast: Secondary | ICD-10-CM | POA: Diagnosis not present

## 2022-08-20 DIAGNOSIS — Z95828 Presence of other vascular implants and grafts: Secondary | ICD-10-CM

## 2022-08-20 DIAGNOSIS — C250 Malignant neoplasm of head of pancreas: Secondary | ICD-10-CM | POA: Insufficient documentation

## 2022-08-20 DIAGNOSIS — C251 Malignant neoplasm of body of pancreas: Secondary | ICD-10-CM | POA: Diagnosis not present

## 2022-08-20 DIAGNOSIS — Z86000 Personal history of in-situ neoplasm of breast: Secondary | ICD-10-CM | POA: Insufficient documentation

## 2022-08-20 DIAGNOSIS — Z510 Encounter for antineoplastic radiation therapy: Secondary | ICD-10-CM | POA: Diagnosis not present

## 2022-08-20 DIAGNOSIS — Z923 Personal history of irradiation: Secondary | ICD-10-CM | POA: Insufficient documentation

## 2022-08-20 DIAGNOSIS — Z9221 Personal history of antineoplastic chemotherapy: Secondary | ICD-10-CM | POA: Insufficient documentation

## 2022-08-20 LAB — CBC WITH DIFFERENTIAL (CANCER CENTER ONLY)
Abs Immature Granulocytes: 0.01 10*3/uL (ref 0.00–0.07)
Basophils Absolute: 0 10*3/uL (ref 0.0–0.1)
Basophils Relative: 0 %
Eosinophils Absolute: 0.1 10*3/uL (ref 0.0–0.5)
Eosinophils Relative: 1 %
HCT: 43.3 % (ref 36.0–46.0)
Hemoglobin: 14.5 g/dL (ref 12.0–15.0)
Immature Granulocytes: 0 %
Lymphocytes Relative: 20 %
Lymphs Abs: 1.1 10*3/uL (ref 0.7–4.0)
MCH: 32.5 pg (ref 26.0–34.0)
MCHC: 33.5 g/dL (ref 30.0–36.0)
MCV: 97.1 fL (ref 80.0–100.0)
Monocytes Absolute: 0.6 10*3/uL (ref 0.1–1.0)
Monocytes Relative: 11 %
Neutro Abs: 3.9 10*3/uL (ref 1.7–7.7)
Neutrophils Relative %: 68 %
Platelet Count: 197 10*3/uL (ref 150–400)
RBC: 4.46 MIL/uL (ref 3.87–5.11)
RDW: 12.2 % (ref 11.5–15.5)
WBC Count: 5.7 10*3/uL (ref 4.0–10.5)
nRBC: 0 % (ref 0.0–0.2)

## 2022-08-20 LAB — CMP (CANCER CENTER ONLY)
ALT: 12 U/L (ref 0–44)
AST: 27 U/L (ref 15–41)
Albumin: 3.9 g/dL (ref 3.5–5.0)
Alkaline Phosphatase: 99 U/L (ref 38–126)
Anion gap: 6 (ref 5–15)
BUN: 13 mg/dL (ref 8–23)
CO2: 31 mmol/L (ref 22–32)
Calcium: 9.3 mg/dL (ref 8.9–10.3)
Chloride: 101 mmol/L (ref 98–111)
Creatinine: 0.63 mg/dL (ref 0.44–1.00)
GFR, Estimated: >60 mL/min (ref >60)
Glucose, Bld: 133 mg/dL — ABNORMAL HIGH (ref 70–99)
Potassium: 4.1 mmol/L (ref 3.5–5.1)
Sodium: 138 mmol/L (ref 135–145)
Total Bilirubin: 0.7 mg/dL (ref 0.3–1.2)
Total Protein: 7 g/dL (ref 6.5–8.1)

## 2022-08-20 LAB — RAD ONC ARIA SESSION SUMMARY
Course Elapsed Days: 4
Plan Fractions Treated to Date: 3
Plan Prescribed Dose Per Fraction: 6.6 Gy
Plan Total Fractions Prescribed: 5
Plan Total Prescribed Dose: 33 Gy
Reference Point Dosage Given to Date: 19.8 Gy
Reference Point Session Dosage Given: 6.6 Gy
Session Number: 3

## 2022-08-20 MED ORDER — HEPARIN SOD (PORK) LOCK FLUSH 100 UNIT/ML IV SOLN
500.0000 [IU] | Freq: Once | INTRAVENOUS | Status: AC
  Administered 2022-08-20: 500 [IU]

## 2022-08-20 MED ORDER — SODIUM CHLORIDE 0.9% FLUSH
10.0000 mL | Freq: Once | INTRAVENOUS | Status: AC
  Administered 2022-08-20: 10 mL

## 2022-08-20 MED ORDER — HYDROCODONE-ACETAMINOPHEN 10-325 MG PO TABS: 1.0000 | ORAL_TABLET | Freq: Four times a day (QID) | ORAL | 0 refills | Status: DC | PRN

## 2022-08-20 NOTE — Assessment & Plan Note (Signed)
cT2N1Mx, with indeterminate lung nodules -diagnosed 02/2022 by EUS/FNA. Staging CT showed sub-cm lung nodules. 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 -she began abraxane/gemcitabine every 2 weeks on 04/02/22. She has had good clinical response. -restaging CT CAP 06/24/22 showed overall good response to therapy.  -her accumulating side effects from chemo are becoming hard for her, and she would like to stop chemo.  -she started consolidation SBRT 08/13/2022

## 2022-08-20 NOTE — Assessment & Plan Note (Signed)
-  diagnosed in 01/2021. S/p lumpectomy 04/16/21, path showed DCIS. Treated with adjuvant radiation 05/22/21 - 06/18/21. She declined antiestrogen therapy.

## 2022-08-21 ENCOUNTER — Encounter: Payer: Self-pay | Admitting: Hematology

## 2022-08-23 ENCOUNTER — Other Ambulatory Visit: Payer: Self-pay

## 2022-08-24 ENCOUNTER — Ambulatory Visit
Admission: RE | Admit: 2022-08-24 | Discharge: 2022-08-24 | Disposition: A | Discharge status: Home / Self Care | Payer: PPO | Source: Ambulatory Visit | Attending: Radiation Oncology | Admitting: Radiation Oncology

## 2022-08-24 ENCOUNTER — Other Ambulatory Visit: Payer: Self-pay

## 2022-08-24 DIAGNOSIS — C259 Malignant neoplasm of pancreas, unspecified: Secondary | ICD-10-CM

## 2022-08-24 DIAGNOSIS — Z510 Encounter for antineoplastic radiation therapy: Secondary | ICD-10-CM | POA: Diagnosis not present

## 2022-08-24 LAB — RAD ONC ARIA SESSION SUMMARY
Course Elapsed Days: 8
Plan Fractions Treated to Date: 4
Plan Prescribed Dose Per Fraction: 6.6 Gy
Plan Total Fractions Prescribed: 5
Plan Total Prescribed Dose: 33 Gy
Reference Point Dosage Given to Date: 26.4 Gy
Reference Point Session Dosage Given: 6.6 Gy
Session Number: 4

## 2022-08-25 ENCOUNTER — Encounter: Payer: Self-pay | Admitting: Hematology

## 2022-08-25 LAB — CANCER ANTIGEN 19-9: CA 19-9: 1788 U/mL — ABNORMAL HIGH (ref 0–35)

## 2022-08-26 ENCOUNTER — Other Ambulatory Visit: Payer: Self-pay

## 2022-08-26 ENCOUNTER — Ambulatory Visit
Admission: RE | Admit: 2022-08-26 | Discharge: 2022-08-26 | Disposition: A | Discharge status: Home / Self Care | Payer: PPO | Source: Ambulatory Visit | Attending: Radiation Oncology | Admitting: Radiation Oncology

## 2022-08-26 DIAGNOSIS — Z510 Encounter for antineoplastic radiation therapy: Secondary | ICD-10-CM | POA: Diagnosis not present

## 2022-08-26 DIAGNOSIS — C259 Malignant neoplasm of pancreas, unspecified: Secondary | ICD-10-CM

## 2022-08-26 DIAGNOSIS — D0512 Intraductal carcinoma in situ of left breast: Secondary | ICD-10-CM | POA: Diagnosis not present

## 2022-08-26 DIAGNOSIS — Z170 Estrogen receptor positive status [ER+]: Secondary | ICD-10-CM | POA: Diagnosis not present

## 2022-08-26 LAB — RAD ONC ARIA SESSION SUMMARY
Course Elapsed Days: 10
Plan Fractions Treated to Date: 5
Plan Prescribed Dose Per Fraction: 6.6 Gy
Plan Total Fractions Prescribed: 5
Plan Total Prescribed Dose: 33 Gy
Reference Point Dosage Given to Date: 33 Gy
Reference Point Session Dosage Given: 6.6 Gy
Session Number: 5

## 2022-08-31 ENCOUNTER — Encounter (INDEPENDENT_AMBULATORY_CARE_PROVIDER_SITE_OTHER): Payer: PPO | Admitting: Ophthalmology

## 2022-08-31 DIAGNOSIS — H43813 Vitreous degeneration, bilateral: Secondary | ICD-10-CM

## 2022-08-31 DIAGNOSIS — H353231 Exudative age-related macular degeneration, bilateral, with active choroidal neovascularization: Secondary | ICD-10-CM | POA: Diagnosis not present

## 2022-09-01 ENCOUNTER — Other Ambulatory Visit: Payer: Self-pay

## 2022-09-03 ENCOUNTER — Other Ambulatory Visit: Payer: Self-pay | Admitting: Hematology

## 2022-09-07 ENCOUNTER — Telehealth: Payer: Self-pay | Admitting: *Deleted

## 2022-09-07 NOTE — Radiation Completion Notes (Signed)
Patient Name: ANTHA, NIDAY MRN: [id number] Date of Birth: [birth year] Referring Physician: Truitt Merle, M.D. Date of Service: 2022-09-07 Radiation Oncologist: Teryl Lucy, M.D. [place]                             Radiation Oncology End of Treatment Note     Diagnosis: C25.9 Malignant neoplasm of pancreas, unspecified Staging on 2021-04-16: Ductal carcinoma in situ (DCIS) of left breast T=pTis (DCIS), N=pN0, M=cM0 Staging on 2021-02-25: Ductal carcinoma in situ (DCIS) of left breast T=cTis (DCIS), N=cN0, M=cM0 Staging on 2022-03-18: Pancreatic cancer ([place]) T=cT2, N=cN1, M=cM1 Intent: Curative     ==========DELIVERED PLANS==========  First Treatment Date: 2022-08-16 - Last Treatment Date: 2022-08-26   Plan Name: Abd_SBRT Site: Pancreas Technique: SBRT/SRT-IMRT Mode: Photon Dose Per Fraction: 6.6 Gy Prescribed Dose (Delivered / Prescribed): 33 Gy / 33 Gy Prescribed Fxs (Delivered / Prescribed): 5 / 5     ==========ON TREATMENT VISIT DATES========== 2022-08-16, 2022-08-18, 2022-08-20, 2022-08-24, 2022-08-24, 2022-08-26     ==========UPCOMING VISITS==========       ==========APPENDIX - ON TREATMENT VISIT NOTES==========   PatEd 2021-05-26 Ongoing education performed.   ImpPlan 2021-05-26 The patient is tolerating radiation. Continue treatment as planned.   PhysExam 2021-05-26 Alert, no acute distress.   RunningNotes 2021-05-26 Radiation education, Alra and Radiaplex provided 05/26/2021 ah   ProgNote 2021-05-26 Patient denies fatigue, nausea or vomiting. Denies pain, tenderness or swelling to treatment area. Appetite is good. Denies lymphedema. Denies skin changes. Skin remains intact.    PatEd 2021-06-02 Ongoing education performed.   ImpPlan 2021-06-02 The patient is tolerating radiation. Continue treatment as planned.   PhysExam 2021-06-02 Alert, no acute distress.   PatEd 2021-06-03 Ongoing education performed.    ImpPlan 2021-06-03 The patient is tolerating radiation. Continue treatment as planned.   PhysExam 2021-06-03 Alert, no acute distress.   ProgNote 2021-06-03 Patient in after completing 9th fraction to left breast. Patient denies pain, reports having mild fatigue. Patient denies any swelling or tenderness to left breast. Patient has full range of motion to left arm. Skin remains intact.    PatEd 2021-06-09 Ongoing education performed.   ImpPlan 2021-06-09 The patient is tolerating radiation. Continue treatment as planned.   PhysExam 2021-06-09 Alert, no acute distress.   ProgNote 2021-06-09 Patient present following 13th fraction to the left breast. Patient denies pain, swelling or tenderness of treatment area. Patient reports mild fatigue and good range of motion. Patient using Radiaplex 1-2 times daily. Pinkness noted to left breast and axilla. Denies itching or burning.   PatEd 2021-06-16 Ongoing education performed.   ImpPlan 2021-06-16 The patient is tolerating radiation. Continue treatment as planned.   PhysExam 2021-06-16 Alert, no acute distress.   ProgNote 2021-06-16 Patient present following 18 fractions to the left breast. Patient states she is tolerating treatment well. Patient reports chaffing underneath left breast that is tender. Patient has mild fatigue and good range of motion. Continues to use Radiaplex 1-2 times daily. Pinkness noted to chest, breast and left axilla. Desquamation present underneath left breast. Encouraged patient to use Radiaplex twice daily as well as neosporin plus to the chaffed area. Denies itching or burning in treatment field. Appetite is poor. Patient is grieving death of husband in Dec 13, 2022.   PatEd 2022-08-24 Ongoing education performed.   ImpPlan 2022-08-24 The patient is tolerating radiation. Continue treatment as planned.   PhysExam 2022-08-24 Alert, no acute distress.   ProgNote 2022-08-24 Changes from last  week/visit? [  No ] Pain? [ Mild abdominal pain, relieved with medication.  ] Fatigue? [ Yes ] Skin irritation? [ No ] Nausea/Vomiting/Diarrhea? [ Mutius.Radish ] Need refills: [ No ] Additional  Weekly Progress Notes [  ]    PatEd 2022-08-26 Ongoing education performed.   ImpPlan 2022-08-26 The patient is tolerating radiation. Continue treatment as planned.   PhysExam 2022-08-26 Alert, no acute distress.

## 2022-09-07 NOTE — Telephone Encounter (Signed)
09/03/2022 Voicemail received from "Jessica Livingston, the patient is Jessica Livingston.  I may be reached at [phone]."  No further information.   09/06/2022 Received call from Midge Minium daughter Margarita Grizzle.  "[employer] has not received LOA paperwork I left 08/20/2022."  Advised form next in queue.  This nurse will begin today." 09/06/2022 Paperwork completed to provider for review, signature and return. 09/07/2021 Connected with Jessica Livingston for clarification of LOA.  Confirmed intermittent leave currently to provide transportation to appointments only and next appointment is 09/27/2022.  Denies needing time away from work to assist with side effect toxicity flare ups.

## 2022-09-08 NOTE — Telephone Encounter (Signed)
Paperwork returned and received by this nurse today from collaborative signed by provider.   Successfully returned via fax.  Mailed to address on file.   [place] [id number] Process completed with copy of [employer] form to H.I.M. bin designated for items to be scanned.  No further instructions received, actions required or performed by this nurse.

## 2022-09-10 ENCOUNTER — Other Ambulatory Visit: Payer: Self-pay | Admitting: Hematology

## 2022-09-13 ENCOUNTER — Telehealth: Payer: Self-pay | Admitting: *Deleted

## 2022-09-13 NOTE — Telephone Encounter (Signed)
Notified it is OK to increase Hydrocodone to every 4 hours rather than every 6 hours.  Message to scheduler to make appt with Palliative care this week or next.

## 2022-09-13 NOTE — Telephone Encounter (Signed)
Jessica Livingston states she has been having back pain X 1 week. She has resumed taking Hydrocodone, but it wears off 2 hours before she can take it again. Feeling bloated, but is not constipated. States this is the same pain that brought her to the cancer center to begin with.Marland Kitchen

## 2022-09-15 ENCOUNTER — Encounter: Payer: Self-pay | Admitting: Nurse Practitioner

## 2022-09-15 ENCOUNTER — Other Ambulatory Visit: Payer: Self-pay

## 2022-09-15 ENCOUNTER — Inpatient Hospital Stay: Payer: PPO | Attending: Hematology | Admitting: Nurse Practitioner

## 2022-09-15 VITALS — BP 113/75 | HR 86 | Temp 98.2°F | Resp 16 | Wt 160.7 lb

## 2022-09-15 DIAGNOSIS — R530 Neoplastic (malignant) related fatigue: Secondary | ICD-10-CM

## 2022-09-15 DIAGNOSIS — C259 Malignant neoplasm of pancreas, unspecified: Secondary | ICD-10-CM

## 2022-09-15 DIAGNOSIS — G893 Neoplasm related pain (acute) (chronic): Secondary | ICD-10-CM | POA: Diagnosis not present

## 2022-09-15 DIAGNOSIS — Z923 Personal history of irradiation: Secondary | ICD-10-CM | POA: Insufficient documentation

## 2022-09-15 DIAGNOSIS — Z66 Do not resuscitate: Secondary | ICD-10-CM | POA: Insufficient documentation

## 2022-09-15 DIAGNOSIS — R110 Nausea: Secondary | ICD-10-CM

## 2022-09-15 DIAGNOSIS — D0512 Intraductal carcinoma in situ of left breast: Secondary | ICD-10-CM | POA: Insufficient documentation

## 2022-09-15 DIAGNOSIS — Z79899 Other long term (current) drug therapy: Secondary | ICD-10-CM | POA: Insufficient documentation

## 2022-09-15 DIAGNOSIS — Z515 Encounter for palliative care: Secondary | ICD-10-CM | POA: Diagnosis not present

## 2022-09-15 DIAGNOSIS — Z170 Estrogen receptor positive status [ER+]: Secondary | ICD-10-CM | POA: Insufficient documentation

## 2022-09-15 DIAGNOSIS — C250 Malignant neoplasm of head of pancreas: Secondary | ICD-10-CM | POA: Insufficient documentation

## 2022-09-15 DIAGNOSIS — R918 Other nonspecific abnormal finding of lung field: Secondary | ICD-10-CM | POA: Insufficient documentation

## 2022-09-15 DIAGNOSIS — Z86000 Personal history of in-situ neoplasm of breast: Secondary | ICD-10-CM | POA: Insufficient documentation

## 2022-09-15 MED ORDER — XTAMPZA ER 9 MG PO C12A: 9.0000 mg | EXTENDED_RELEASE_CAPSULE | Freq: Two times a day (BID) | ORAL | 0 refills | Status: DC

## 2022-09-15 MED ORDER — PROCHLORPERAZINE MALEATE 10 MG PO TABS: 10.0000 mg | ORAL_TABLET | Freq: Four times a day (QID) | ORAL | 2 refills | Status: DC | PRN

## 2022-09-15 NOTE — Progress Notes (Signed)
[facility]  [phone] [phone]   Name: Jessica Livingston Date: 09/15/2022 MRN: [id number]  DOB: [birth year]  Patient Care Team: Deland Pretty, MD as PCP - General (Internal Medicine) Mauro Kaufmann, RN as Oncology Nurse Navigator Rockwell Germany, RN as Oncology Nurse Navigator Jovita Kussmaul, MD as Consulting Physician (General Surgery) Nicholas Lose, MD as Consulting Physician (Hematology and Oncology) Gery Pray, MD as Consulting Physician (Radiation Oncology) Delice Bison, Charlestine Massed, NP as Nurse Practitioner (Hematology and Oncology) Harmon Pier, RN as Registered Nurse Truitt Merle, MD as Consulting Physician (Oncology) Royston Bake, RN as Oncology Nurse Navigator (Oncology)    REASON FOR CONSULTATION: Jessica Livingston is a 82 y.o. 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 S/p lumpectomy 04/16/21, path showed DCIS. Treated with adjuvant radiation, she declined antiestrogen therapy. Abraxane/gemzar for her pancreatic cancer (last dose 06/21/22), she started consolidation SBRT 08/13/2022. Palliative ask to see for symptom and pain management and goals of care.    SOCIAL HISTORY:     reports that she has never smoked. She has never used smokeless tobacco. She reports current alcohol use. She reports that she does not use drugs.  ADVANCE DIRECTIVES:  None on file  CODE STATUS:   PAST MEDICAL HISTORY: Past Medical History:  Diagnosis Date   breast cancer 2022   Left   CAD (coronary artery disease)    Cataract 2018   had sx   Depression    Family history of breast cancer    Heart disease '   History of radiation therapy    Left breast 05/21/21-06/18/21- Dr. Gery Pray   HLD (hyperlipidemia)    Obesity, unspecified    Other abnormal glucose    Other B-complex deficiencies    Other seborrheic keratosis    Pancreatic cancer ([place])  02/2022   Postmenopausal     PAST SURGICAL HISTORY:  Past Surgical History:  Procedure Laterality Date   BIOPSY  07/26/2022   Procedure: BIOPSY;  Surgeon: Irving Copas., MD;  Location: [facility];  Service: Gastroenterology;;   BREAST LUMPECTOMY WITH RADIOACTIVE SEED LOCALIZATION Left 04/16/2021   Procedure: LEFT BREAST LUMPECTOMY WITH RADIOACTIVE SEED LOCALIZATION;  Surgeon: Jovita Kussmaul, MD;  Location: [facility];  Service: General;  Laterality: Left;   CARDIAC CATHETERIZATION  5/03   mild to mod single vessel disease   CHOLECYSTECTOMY     COLONOSCOPY  2018   HPP/TA   ESOPHAGOGASTRODUODENOSCOPY (EGD) WITH PROPOFOL N/A 03/18/2022   Procedure: ESOPHAGOGASTRODUODENOSCOPY (EGD) WITH PROPOFOL;  Surgeon: [name], MD;  Location: [facility];  Service: Gastroenterology;  Laterality: N/A;   ESOPHAGOGASTRODUODENOSCOPY (EGD) WITH PROPOFOL N/A 07/26/2022   Procedure: ESOPHAGOGASTRODUODENOSCOPY (EGD) WITH PROPOFOL;  Surgeon: Rush Landmark Telford Nab., MD;  Location: [facility];  Service: Gastroenterology;  Laterality: N/A;   EUS N/A 03/18/2022   Procedure: ESOPHAGEAL ENDOSCOPIC ULTRASOUND (EUS) RADIAL;  Surgeon: [name], MD;  Location: [facility];  Service: Gastroenterology;  Laterality: N/A;   EUS N/A 07/26/2022   Procedure: UPPER ENDOSCOPIC ULTRASOUND (EUS) LINEAR;  Surgeon: Irving Copas., MD;  Location[facility];  Service: Gastroenterology;  Laterality: N/A;   FIDUCIAL MARKER PLACEMENT N/A 07/26/2022   Procedure: FIDUCIAL MARKER PLACEMENT;  Surgeon: Irving Copas., MD;  Location: [facility];  Service: Gastroenterology;  Laterality: N/A;   FINE NEEDLE ASPIRATION N/A 03/18/2022   Procedure: FINE NEEDLE ASPIRATION (FNA) LINEAR;  Surgeon: [name],  MD;  [facility];  Service: Gastroenterology;  Laterality: N/A;   IR IMAGING GUIDED PORT INSERTION  03/31/2022   LAPAROSCOPIC GASTRIC BANDING  01/13/2010   NECK SURGERY     POLYPECTOMY   2018   TONSILLECTOMY     TUBAL LIGATION Bilateral     HEMATOLOGY/ONCOLOGY HISTORY:  Oncology History  Ductal carcinoma in situ (DCIS) of left breast  02/17/2021 Initial Diagnosis   Screening mammogram detected left breast calcifications lower inner quadrant 0.9 cm: Biopsy revealed high-grade DCIS with necrosis and calcifications, ER 30%, PR 50% weak   02/25/2021 Cancer Staging   Staging form: Breast, AJCC 8th Edition - Clinical stage from 02/25/2021: Stage 0 (cTis (DCIS), cN0, cM0, G3, ER+, PR+, HER2: Not Assessed) - Signed by Nicholas Lose, MD on 02/25/2021 Stage prefix: Initial diagnosis Histologic grading system: 3 grade system   04/16/2021 Cancer Staging   Staging form: Breast, AJCC 8th Edition - Pathologic stage from 04/16/2021: Stage 0 (pTis (DCIS), pN0, cM0, ER+, PR+) - Signed by Gardenia Phlegm, NP on 09/04/2021 Stage prefix: Initial diagnosis Nuclear grade: G3   04/16/2021 Surgery   Left breast lumpectomy with radioactive seed localization   05/21/2021 - 06/18/2021 Radiation Therapy    Radiation Treatment Dates: 05/21/2021 through 06/18/2021 Site Technique Total Dose (Gy) Dose per Fx (Gy) Completed Fx Beam Energies  Breast, Left: Breast_Lt 3D 40.05/40.05 2.67 15/15 6X, 10X  Breast, Left: Breast_Lt_Bst 3D 10/10 2 5/5 6X    Pancreatic cancer ([place])  03/04/2022 Imaging   CT ABDOMEN W CONTRAST   IMPRESSION: 1. Findings highly suspicious for adenocarcinoma in the pancreatic head with resultant upstream atrophy and duct dilatation. Venous encasement as detailed above. No arterial involvement. 2. Suspect cirrhosis and mild steatosis. 3. Coronary artery atherosclerosis. Aortic Atherosclerosis (ICD10-I70.0).   03/09/2022 Tumor Marker   Patient's tumor was tested for the following markers: CA-19.9. Results of the tumor marker test revealed 1715.   03/18/2022 Procedure   EUS-Dr. Ardis Hughs  1. Irregularly shaped, indistinctly bordered, heterogeneous, hypoechoic mass that  measures 3.4 cm maximally in the uncinate/head of pancreas. The mass clearly surrounds and attenuates the portal vein near the splenic vein, SMV confluence. The mass is causing mild biliary duct obstruction with the common bile duct measuring 8.6 mm. The mass is also obstructing and dilating the main pancreatic duct which measures up to 9 millimeters in the body and tail. I used a 25-gauge EUS FNB needle to sample the lesion with a transduodenal approach, 2 passes. 2. No peripancreatic adenopathy 3. Limited views of the liver, spleen were normal   03/18/2022 Pathology Results   CYTOLOGY - NON PAP  CASE: [id number]  PATIENT: [name]  Non-Gynecological Cytology Report   Clinical History: Cirrhosis, pancreatic mass  Specimen Submitted:  A. PANCREAS, HEAD, FINE NEEDLE ASPIRATION:    FINAL MICROSCOPIC DIAGNOSIS:  - Malignant cells consistent with adenocarcinoma   SPECIMEN ADEQUACY:  Satisfactory for evaluation   IMMEDIATE EVALUATION:  SUFFICIENT, POSITIVE FOR MALIGNANCY CONSISTENT WITH ADENO CA. (MPL)    03/18/2022 Cancer Staging   Staging form: Exocrine Pancreas, AJCC 8th Edition - Clinical stage from 03/18/2022: Stage IV (cT2, cN1, cM1) - Signed by Truitt Merle, MD on 04/02/2022   03/23/2022 Initial Diagnosis   Pancreatic cancer ([place])   04/02/2022 - 04/16/2022 Chemotherapy   Patient is on Treatment Plan : PANCREATIC Abraxane / Gemcitabine D1,15 q28d      Genetic Testing   [facility] CancerNext-Expanded Panel was Negative. Report date is 04/03/2022.  The CancerNext-Expanded gene panel  offered by [employer] and includes sequencing, rearrangement, and RNA analysis for the following 77 genes: AIP, ALK, APC, ATM, AXIN2, BAP1, BARD1, BLM, BMPR1A, BRCA1, BRCA2, BRIP1, CDC73, CDH1, CDK4, CDKN1B, CDKN2A, CHEK2, CTNNA1, DICER1, FANCC, FH, FLCN, GALNT12, KIF1B, LZTR1, MAX, MEN1, MET, MLH1, MSH2, MSH3, MSH6, MUTYH, NBN, NF1, NF2, NTHL1, PALB2, PHOX2B, PMS2, POT1, PRKAR1A, PTCH1, PTEN,  RAD51C, RAD51D, RB1, RECQL, RET, SDHA, SDHAF2, SDHB, SDHC, SDHD, SMAD4, SMARCA4, SMARCB1, SMARCE1, STK11, SUFU, TMEM127, TP53, TSC1, TSC2, VHL and XRCC2 (sequencing and deletion/duplication); EGFR, EGLN1, HOXB13, KIT, MITF, PDGFRA, POLD1, and POLE (sequencing only); EPCAM and GREM1 (deletion/duplication only).    04/02/2022 -  Chemotherapy   Patient is on Treatment Plan : PANCREATIC Abraxane D1,8,15 + Gemcitabine D1,8,15 q28d       ALLERGIES:  is allergic to atorvastatin and rosuvastatin.  MEDICATIONS:  Current Outpatient Medications  Medication Sig Dispense Refill   Besifloxacin HCl (BESIVANCE) 0.6 % SUSP Place 1 drop into both eyes See admin instructions. Instill 1 drop into both eyes 3 times daily once a month on the day of monthly eye injections     Cholecalciferol (VITAMIN D) 50 MCG (2000 UT) CAPS Take 50 capsules by mouth once.     cyanocobalamin (VITAMIN B12) 1000 MCG tablet Take 1,000 mcg by mouth daily.     HYDROcodone-acetaminophen (NORCO) 10-325 MG tablet TAKE 1 TABLET BY MOUTH EVERY 6 HOURS AS NEEDED. 30 tablet 0   levothyroxine (SYNTHROID) 50 MCG tablet Take 50 mcg by mouth daily before breakfast.     lipase/protease/amylase (CREON) 36000 UNITS CPEP capsule Take 1 capsule (36,000 Units total) by mouth 3 (three) times daily with meals AND 1 capsule (36,000 Units total) 3 (three) times daily before meals. (Patient not taking: Reported on 07/28/2022) 60 capsule 0   omeprazole (PRILOSEC) 20 MG capsule Take 20 mg by mouth daily.     ondansetron (ZOFRAN) 8 MG tablet Take 1 tablet (8 mg total) by mouth every 8 (eight) hours as needed for nausea or vomiting. 30 tablet 1   ondansetron (ZOFRAN-ODT) 4 MG disintegrating tablet Take 1 tablet (4 mg total) by mouth every 8 (eight) hours as needed for nausea or vomiting. 20 tablet 0   prochlorperazine (COMPAZINE) 10 MG tablet Take 1 tablet (10 mg total) by mouth every 6 (six) hours as needed. (Patient not taking: Reported on 07/19/2022) 30 tablet 2    No current facility-administered medications for this visit.    VITAL SIGNS: There were no vitals taken for this visit. There were no vitals filed for this visit.  Estimated body mass index is 29.71 kg/m as calculated from the following:   Height as of 07/28/22: '5\' 3"'$  (1.6 m).   Weight as of 08/20/22: 167 lb 11.2 oz (76.1 kg).  LABS: CBC:    Component Value Date/Time   WBC 5.7 08/20/2022 1307   WBC 5.2 03/11/2022 0857   HGB 14.5 08/20/2022 1307   HCT 43.3 08/20/2022 1307   PLT 197 08/20/2022 1307   MCV 97.1 08/20/2022 1307   NEUTROABS 3.9 08/20/2022 1307   LYMPHSABS 1.1 08/20/2022 1307   MONOABS 0.6 08/20/2022 1307   EOSABS 0.1 08/20/2022 1307   BASOSABS 0.0 08/20/2022 1307   Comprehensive Metabolic Panel:    Component Value Date/Time   NA 138 08/20/2022 1307   K 4.1 08/20/2022 1307   CL 101 08/20/2022 1307   CO2 31 08/20/2022 1307   BUN 13 08/20/2022 1307   CREATININE 0.63 08/20/2022 1307   GLUCOSE 133 (H) 08/20/2022 1307  CALCIUM 9.3 08/20/2022 1307   AST 27 08/20/2022 1307   ALT 12 08/20/2022 1307   ALKPHOS 99 08/20/2022 1307   BILITOT 0.7 08/20/2022 1307   PROT 7.0 08/20/2022 1307   ALBUMIN 3.9 08/20/2022 1307    RADIOGRAPHIC STUDIES: No results found.  PERFORMANCE STATUS (ECOG) : 1 - Symptomatic but completely ambulatory  Review of Systems  Constitutional:  Positive for appetite change and fatigue.  Musculoskeletal:  Positive for arthralgias.  Unless otherwise noted, a complete review of systems is negative.  Physical Exam General: NAD Cardiovascular: regular rate and rhythm Pulmonary: clear ant fields Abdomen: soft, nontender, + bowel sounds Extremities: no edema, no joint deformities Skin: no rashes Neurological:AAO x3, mood appropriate   IMPRESSION: This is my initial visit with [name]. Her close friend, Danton Clap is present with her today. No acute distress noted. Ambulatory without assistive devices. Patient is alert and able to engage  appropriately in discussions.   I introduced myself, Maygan RN, and Palliative's role in collaboration with the oncology team. Concept of Palliative Care was introduced as specialized medical care for people and their families living with serious illness.  It focuses on providing relief from the symptoms and stress of a serious illness.  The goal is to improve quality of life for both the patient and the family. Values and goals of care important to patient and family were attempted to be elicited.   Ms. Racette [place] alone. She has several dogs and cows. Is widowed. Was married to her husband for more than 60 years. She has 2 children who are both in [place]. Retired [employer].   Ms. Silos is independent of all ADLs with limitations due to pain and some hand shakes. She shares her right hand will begin to tremor causing it difficult to perform task with that hand and also drop things. Appetite is minimal. Some days are better than others.   Neoplasm related pain Cambreigh complains of abdominal and back pain. Describes pain as intermittent, ache, throb. Pain is increased with anxiety and later in the day. Pain is somewhat improved with heat compress and use of Norco. She shares pain returns within 2 hours of taking medication. She is taking Norco around the clock every 4-5 hours.   We discussed at length pain regimen and goal. She is hopeful pain can be better controlled allowing her an improvement in her quality of life. Education provided on the use of long-acting medications in addition to having medication available for breakthrough pain. Education provided on potential side-effects, risk for constipation with importance on bowel regimen, and medication use/frequency. Patient verbalizes understanding and expresses agreement.   Nausea  Ms. Gutt endorses ongoing nausea. This also contributes to her decreased appetite at times. She is taking ondansetron with some relief. Education provided on  use of compazine in addition to zofran on days that she is not achieving full relief.   Goals of Care  We discussed her current illness and what it means in the larger context of Her on-going co-morbidities. Natural disease trajectory and expectations were discussed.   Daianna is realistic in her understanding of current illness. She has recently completed radiation therapy. Her chemotherapy was discontinued as requested. Patient is clear in expressed wishes to treat the treatable with a main focus on her symptoms and quality of life. She wishes to take things one day at a time.   I discussed the importance of continued conversation with family and their medical providers regarding  overall plan of care and treatment options, ensuring decisions are within the context of the patients values and GOCs.  PLAN: Established therapeutic relationship. Education provided on palliative's role in collaboration with their Oncology/Radiation team. Xtampza 9 mg every 12 hours  Hydrocodone every 4-6 hours as needed for pain Zofran and compazine as needed for pain  Miralax daily for bowel regimen  I will plan to see patient back in 2-4 weeks in collaboration to other oncology appointments.    Patient expressed understanding and was in agreement with this plan. She also understands that She can call the clinic at any time with any questions, concerns, or complaints.   Thank you for your referral and allowing Palliative to assist in Mr./Mrs. Tonye Pearson Bartko's care.   Number and complexity of problems addressed: HIGH - 1 or more chronic illnesses with SEVERE exacerbation, progression, or side effects of treatment - advanced cancer, pain. Any controlled substances utilized were prescribed in the context of palliative care.  Time Total: 50 min   Visit consisted of counseling and education dealing with the complex and emotionally intense issues of symptom management and palliative care in the setting of serious and  potentially life-threatening illness.Greater than 50%  of this time was spent counseling and coordinating care related to the above assessment and plan.  Signed by: Alda Lea, AGPCNP-BC Palliative Medicine Team/[facility]ona Watchung

## 2022-09-15 NOTE — Patient Instructions (Addendum)
-  continue with your omeprazole (Prilosec) every day to help with nausea - take ondansetron (Zofran) every 6-8 hours for nausea - pick up xtampza (long acting oxycodone) and take every 12 hours (8am and 8pm or 9am and 9pm, etc.) - take daily stool softeners to prevent constipation and to have a bowel movement every day - call for a refill 2-3 days BEFORE running out so you can pick the medications up in a timely manner - call emergency services if you are having chest pain or new sudden shortness of breath - call the office [phone] if you develop a rash or are overly drowsy with this new medication   - we will call and check in with you next week to see how everything is going

## 2022-09-16 ENCOUNTER — Other Ambulatory Visit: Payer: Self-pay | Admitting: Hematology

## 2022-09-16 ENCOUNTER — Encounter: Payer: Self-pay | Admitting: Hematology

## 2022-09-17 ENCOUNTER — Other Ambulatory Visit: Payer: Self-pay

## 2022-09-17 MED ORDER — HYDROCODONE-ACETAMINOPHEN 10-325 MG PO TABS: 1.0000 | ORAL_TABLET | Freq: Four times a day (QID) | ORAL | 0 refills | Status: DC | PRN

## 2022-09-17 NOTE — Telephone Encounter (Signed)
Pt called for refill, see new orders

## 2022-09-20 NOTE — Progress Notes (Signed)
Jessica Livingston is here today for follow up post radiation to the pancreas  They completed their radiation on: 08-16-22 to 08-26-22   Does the patient complain of any of the following:  Pain:Patient reports pain being under control with pain medications.  Diarrhea/Constipation: Yes, both.  Nausea/Vomiting: Nausea, relieved with Zofran and Compazine.  Urinary Issues (dysuria/incomplete emptying/ incontinence/ increased frequency/urgency: No Post radiation skin changes: No   Additional comments if applicable:  BP 697/94   Pulse 87   Temp 98 F (36.7 C)   Resp 18   Wt 157 lb (71.2 kg)   SpO2 98%   BMI 27.81 kg/m

## 2022-09-21 ENCOUNTER — Encounter: Payer: Self-pay | Admitting: Radiation Oncology

## 2022-09-23 ENCOUNTER — Telehealth: Payer: Self-pay

## 2022-09-23 NOTE — Telephone Encounter (Signed)
RN returned a VM reporting "symptoms" of the pt. Pt called and pt friend Danton Clap was also on the call. Pt reported having constipation, feeling weak and at times dizzy, as well as abdominal pain. Pt had not had a bowel movement in 2 days, the last bowel movement she had was reportedly very hard. Pt also reports she is eating well but "not drinking enough". Pt educated on drink more fluids as well as to take sennakot every day, twice a day until she has a bowel movement to help with constipation and abdominal pain. Pt also reported taking her norco every 4 hours regardless of pain in fear of her pain becoming uncontrolled, RN educated on causes of constipation as well as how to take pain medications as little as possible without being in pain. Pt voiced understanding of all education. RN to call tomorrow to check in. Pt will call with any changes.

## 2022-09-24 ENCOUNTER — Inpatient Hospital Stay: Payer: PPO | Admitting: Nurse Practitioner

## 2022-09-24 NOTE — Progress Notes (Signed)
Radiation Oncology         [phone] ________________________________  Name: RASHEMA SEAWRIGHT MRN: [id number]  Date: 09/27/2022  DOB: [birth year]  Follow-Up Visit Note  CC: Deland Pretty, MD  Truitt Merle, MD    ICD-10-CM   1. 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  The patient tolerated radiation therapy relatively well. During her final weekly treatment check on 08/24/22, the patient endorsed mild abdominal pain (relieved with medication), fatigue, and nausea (also well managed with medications). She denied any other symptoms or concerns.      In the midst of radiation treatment, the patient followed up with Dr. Burr Medico on 08/20/22. During which time, the patient reported having episodes nausea with emesis from radiation therapy. She otherwise denied any other concerns. Of note: Dr. Burr Medico refilled her Hydrocodone on the date of this visit.   The patient was also referred to palliative medicine on 09/15/22 to discuss symptom and pain management. For her disease related pain, the patient will continue with Hydrocodone q4-6 hours as needed and Xtampza. She was also prescribed Zofran and Compazine for nausea.                      ***         Allergies:  is allergic to atorvastatin and rosuvastatin.  Meds: Current Outpatient Medications  Medication Sig Dispense Refill    Besifloxacin HCl (BESIVANCE) 0.6 % SUSP Place 1 drop into both eyes See admin instructions. Instill 1 drop into both eyes 3 times daily once a month on the day of monthly eye injections     Cholecalciferol (VITAMIN D) 50 MCG (2000 UT) CAPS Take 50 capsules by mouth once.     cyanocobalamin (VITAMIN B12) 1000 MCG tablet Take 1,000 mcg by mouth daily.     HYDROcodone-acetaminophen (NORCO) 10-325 MG tablet Take 1 tablet by mouth every 6 (six) hours as needed. 60 tablet 0   levothyroxine (SYNTHROID) 50 MCG tablet Take 50 mcg by mouth daily before breakfast.     lipase/protease/amylase (CREON) 36000 UNITS CPEP capsule Take 1 capsule (36,000 Units total) by mouth 3 (three) times daily with meals AND 1 capsule (36,000 Units total) 3 (three) times daily before meals. (Patient not taking: Reported on 07/28/2022) 60 capsule 0   omeprazole (PRILOSEC) 20 MG capsule Take 20 mg by mouth daily.     ondansetron (ZOFRAN) 8 MG tablet Take 1 tablet (8 mg total) by mouth every 8 (eight) hours as needed for nausea or vomiting. 30 tablet 1   ondansetron (ZOFRAN-ODT) 4 MG disintegrating tablet Take 1 tablet (4 mg total) by mouth every 8 (eight) hours as needed for nausea or vomiting. 20 tablet 0   oxyCODONE ER (XTAMPZA ER) 9 MG C12A Take 9 mg by mouth every 12 (twelve) hours. 30 capsule 0   prochlorperazine (COMPAZINE) 10 MG tablet Take 1  tablet (10 mg total) by mouth every 6 (six) hours as needed. 30 tablet 2   No current facility-administered medications for this encounter.    Physical Findings: The patient is in no acute distress. Patient is alert and oriented.  vitals were not taken for this visit. .  No significant changes. Lungs are clear to auscultation bilaterally. Heart has regular rate and rhythm. No palpable cervical, supraclavicular, or axillary adenopathy. Abdomen soft, non-tender, normal bowel sounds.   Lab Findings: Lab Results  Component Value Date   WBC 5.7 08/20/2022   HGB 14.5 08/20/2022   HCT  43.3 08/20/2022   MCV 97.1 08/20/2022   PLT 197 08/20/2022    Radiographic Findings: No results found.  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  ***  Plan:  ***   *** minutes of total time was spent for this patient encounter, including preparation, face-to-face counseling with the patient and coordination of care, physical exam, and documentation of the encounter. ____________________________________  Blair Promise, PhD, MD  This document serves as a record of services personally performed by Gery Pray, MD. It was created on his behalf by Roney Mans, a trained medical scribe. The creation of this record is based on the scribe's personal observations and the provider's statements to them. This document has been checked and approved by the attending provider.

## 2022-09-24 NOTE — Progress Notes (Signed)
This palliative RN called to check in with pt regarding symptoms she was experiencing yesterday. Pt reports feeling much better today and also reports have a good bowel movement today as well. Pt reports no further concerns at this time, pt aware to call back should any arise.

## 2022-09-24 NOTE — Progress Notes (Signed)
  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 During her final weekly treatment check on 08/24/22, the patient endorsed mild abdominal pain (relieved with medication), fatigue, and nausea (also well managed with medications). She denied any other symptoms or concerns.   Plan: The patient will follow-up with radiation oncology in one month .  ________________________________________________ -----------------------------------  Blair Promise, PhD, MD  This document serves as a record of services personally performed by Gery Pray, MD. It was created on his behalf by Roney Mans, a trained medical scribe. The creation of this record is based on the scribe's personal observations and the provider's statements to them. This document has been checked and approved by the attending provider.

## 2022-09-26 NOTE — Assessment & Plan Note (Signed)
-  diagnosed in 01/2021. S/p lumpectomy 04/16/21, path showed DCIS. Treated with adjuvant radiation 05/22/21 - 06/18/21. She declined antiestrogen therapy.

## 2022-09-26 NOTE — Assessment & Plan Note (Signed)
cT2N1Mx, with indeterminate lung nodules -diagnosed 02/2022 by EUS/FNA. Staging CT showed sub-cm lung nodules. 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 -she began abraxane/gemcitabine every 2 weeks on 04/02/22. She has had good clinical response. -restaging CT CAP 06/24/22 showed overall good response to therapy.  -her accumulating side effects from chemo are becoming hard for her, and she would like to stop chemo.  -she started consolidation SBRT 08/13/2022 and completed on 08/26/22 -

## 2022-09-27 ENCOUNTER — Encounter: Payer: Self-pay | Admitting: Radiation Oncology

## 2022-09-27 ENCOUNTER — Inpatient Hospital Stay: Payer: PPO | Admitting: Hematology

## 2022-09-27 ENCOUNTER — Other Ambulatory Visit: Payer: Self-pay

## 2022-09-27 ENCOUNTER — Inpatient Hospital Stay: Payer: PPO

## 2022-09-27 ENCOUNTER — Encounter: Payer: Self-pay | Admitting: Hematology

## 2022-09-27 ENCOUNTER — Ambulatory Visit
Admission: RE | Admit: 2022-09-27 | Discharge: 2022-09-27 | Disposition: A | Discharge status: Home / Self Care | Payer: PPO | Source: Ambulatory Visit | Attending: Radiation Oncology | Admitting: Radiation Oncology

## 2022-09-27 VITALS — BP 125/76 | HR 87 | Temp 98.0°F | Resp 18 | Wt 157.0 lb

## 2022-09-27 VITALS — BP 125/76 | HR 87 | Temp 98.0°F | Resp 19 | Ht 63.0 in | Wt 157.8 lb

## 2022-09-27 DIAGNOSIS — C250 Malignant neoplasm of head of pancreas: Secondary | ICD-10-CM | POA: Diagnosis not present

## 2022-09-27 DIAGNOSIS — R918 Other nonspecific abnormal finding of lung field: Secondary | ICD-10-CM | POA: Diagnosis not present

## 2022-09-27 DIAGNOSIS — Z86000 Personal history of in-situ neoplasm of breast: Secondary | ICD-10-CM | POA: Insufficient documentation

## 2022-09-27 DIAGNOSIS — Z923 Personal history of irradiation: Secondary | ICD-10-CM | POA: Insufficient documentation

## 2022-09-27 DIAGNOSIS — D0512 Intraductal carcinoma in situ of left breast: Secondary | ICD-10-CM | POA: Diagnosis not present

## 2022-09-27 DIAGNOSIS — C259 Malignant neoplasm of pancreas, unspecified: Secondary | ICD-10-CM | POA: Insufficient documentation

## 2022-09-27 DIAGNOSIS — Z79899 Other long term (current) drug therapy: Secondary | ICD-10-CM | POA: Diagnosis not present

## 2022-09-27 DIAGNOSIS — Z170 Estrogen receptor positive status [ER+]: Secondary | ICD-10-CM | POA: Insufficient documentation

## 2022-09-27 DIAGNOSIS — Z95828 Presence of other vascular implants and grafts: Secondary | ICD-10-CM

## 2022-09-27 DIAGNOSIS — Z66 Do not resuscitate: Secondary | ICD-10-CM | POA: Diagnosis not present

## 2022-09-27 LAB — CMP (CANCER CENTER ONLY)
ALT: 10 U/L (ref 0–44)
AST: 23 U/L (ref 15–41)
Albumin: 3.2 g/dL — ABNORMAL LOW (ref 3.5–5.0)
Alkaline Phosphatase: 97 U/L (ref 38–126)
Anion gap: 7 (ref 5–15)
BUN: 11 mg/dL (ref 8–23)
CO2: 31 mmol/L (ref 22–32)
Calcium: 8.7 mg/dL — ABNORMAL LOW (ref 8.9–10.3)
Chloride: 101 mmol/L (ref 98–111)
Creatinine: 0.65 mg/dL (ref 0.44–1.00)
GFR, Estimated: >60 mL/min (ref >60)
Glucose, Bld: 136 mg/dL — ABNORMAL HIGH (ref 70–99)
Potassium: 3.4 mmol/L — ABNORMAL LOW (ref 3.5–5.1)
Sodium: 139 mmol/L (ref 135–145)
Total Bilirubin: 0.6 mg/dL (ref 0.3–1.2)
Total Protein: 6.2 g/dL — ABNORMAL LOW (ref 6.5–8.1)

## 2022-09-27 LAB — CBC WITH DIFFERENTIAL (CANCER CENTER ONLY)
Abs Immature Granulocytes: 0.01 10*3/uL (ref 0.00–0.07)
Basophils Absolute: 0 10*3/uL (ref 0.0–0.1)
Basophils Relative: 1 %
Eosinophils Absolute: 0.1 10*3/uL (ref 0.0–0.5)
Eosinophils Relative: 1 %
HCT: 42.6 % (ref 36.0–46.0)
Hemoglobin: 14.3 g/dL (ref 12.0–15.0)
Immature Granulocytes: 0 %
Lymphocytes Relative: 18 %
Lymphs Abs: 1.1 10*3/uL (ref 0.7–4.0)
MCH: 31.4 pg (ref 26.0–34.0)
MCHC: 33.6 g/dL (ref 30.0–36.0)
MCV: 93.4 fL (ref 80.0–100.0)
Monocytes Absolute: 0.6 10*3/uL (ref 0.1–1.0)
Monocytes Relative: 11 %
Neutro Abs: 3.9 10*3/uL (ref 1.7–7.7)
Neutrophils Relative %: 69 %
Platelet Count: 204 10*3/uL (ref 150–400)
RBC: 4.56 MIL/uL (ref 3.87–5.11)
RDW: 12.5 % (ref 11.5–15.5)
WBC Count: 5.8 10*3/uL (ref 4.0–10.5)
nRBC: 0 % (ref 0.0–0.2)

## 2022-09-27 MED ORDER — HYDROCODONE-ACETAMINOPHEN 5-325 MG PO TABS: 2.0000 | ORAL_TABLET | ORAL | 0 refills | Status: DC | PRN

## 2022-09-27 MED ORDER — SODIUM CHLORIDE 0.9% FLUSH
10.0000 mL | Freq: Once | INTRAVENOUS | Status: AC
  Administered 2022-09-27: 10 mL

## 2022-09-27 MED ORDER — HEPARIN SOD (PORK) LOCK FLUSH 100 UNIT/ML IV SOLN
500.0000 [IU] | Freq: Once | INTRAVENOUS | Status: AC
  Administered 2022-09-27: 500 [IU]

## 2022-09-27 MED ORDER — HYDROCODONE-ACETAMINOPHEN 10-325 MG PO TABS: 1.0000 | ORAL_TABLET | ORAL | 0 refills | Status: DC | PRN

## 2022-09-27 NOTE — Telephone Encounter (Signed)
Pt called for refill, see new orders

## 2022-09-27 NOTE — Telephone Encounter (Signed)
Pt called reporting they were out of stock of the 10-325 norcos, change of dose sent in

## 2022-09-27 NOTE — Progress Notes (Signed)
[place]   [phone] [phone]   Clinic Follow up Note   Patient Care Team: Deland Pretty, MD as PCP - General (Internal Medicine) Mauro Kaufmann, RN as Oncology Nurse Navigator Rockwell Germany, RN as Oncology Nurse Navigator Jovita Kussmaul, MD as Consulting Physician (General Surgery) Nicholas Lose, MD as Consulting Physician (Hematology and Oncology) Gery Pray, MD as Consulting Physician (Radiation Oncology) Delice Bison, Charlestine Massed, NP as Nurse Practitioner (Hematology and Oncology) Harmon Pier, RN as Registered Nurse Truitt Merle, MD as Consulting Physician (Oncology) Royston Bake, RN as Oncology Nurse Navigator (Oncology)  Date of Service:  09/27/2022  CHIEF COMPLAINT: f/u of  pancreatic cancer    CURRENT THERAPY:  Supportive Care   ASSESSMENT:  Jessica Livingston is a 82 y.o. female with  pancreatic cancer     Ductal carcinoma in situ (DCIS) of left breast -diagnosed in 01/2021. S/p lumpectomy 04/16/21, path showed DCIS. Treated with adjuvant radiation 05/22/21 - 06/18/21. She declined antiestrogen therapy.      Pancreatic cancer ([place]) cT2N1Mx, with indeterminate lung nodules -diagnosed 02/2022 by EUS/FNA. Staging CT showed sub-cm lung nodules. 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 -she began abraxane/gemcitabine every 2 weeks on 04/02/22. She has had good clinical response. -restaging CT CAP 06/24/22 showed overall good response to therapy.  -her accumulating side effects from chemo are becoming hard for her, and she would like to stop chemo.  -she started consolidation SBRT 08/13/2022 and completed on 08/26/22 -She still has moderate pain, has not noticed much improvement since radiation. -Patient does not want more chemotherapy. -We discussed supportive care alone, she agrees.  She will continue follow-up with Perative care NP Lexine Baton in our office, I will set up palliative home care also, the open to  hospice when she needs it. -I encouraged her to discuss the above with her children.  -she agrees with DNR, I placed the order in her chart today      PLAN: - referral for Palliative care at home to help manage care - proceed to radiation f/u appointment today - discussed DNR and placed in chart - f/u with Lexine Baton to manage pain in 1 to 2 weeks - include nutritional supplement into diet  -I we will see her as needed.    SUMMARY OF ONCOLOGIC HISTORY: Oncology History  Ductal carcinoma in situ (DCIS) of left breast  02/17/2021 Initial Diagnosis   Screening mammogram detected left breast calcifications lower inner quadrant 0.9 cm: Biopsy revealed high-grade DCIS with necrosis and calcifications, ER 30%, PR 50% weak   02/25/2021 Cancer Staging   Staging form: Breast, AJCC 8th Edition - Clinical stage from 02/25/2021: Stage 0 (cTis (DCIS), cN0, cM0, G3, ER+, PR+, HER2: Not Assessed) - Signed by Nicholas Lose, MD on 02/25/2021 Stage prefix: Initial diagnosis Histologic grading system: 3 grade system   04/16/2021 Cancer Staging   Staging form: Breast, AJCC 8th Edition - Pathologic stage from 04/16/2021: Stage 0 (pTis (DCIS), pN0, cM0, ER+, PR+) - Signed by Gardenia Phlegm, NP on 09/04/2021 Stage prefix: Initial diagnosis Nuclear grade: G3   04/16/2021 Surgery   Left breast lumpectomy with radioactive seed localization   05/21/2021 - 06/18/2021 Radiation Therapy    Radiation Treatment Dates: 05/21/2021 through 06/18/2021 Site Technique Total Dose (Gy) Dose per Fx (Gy) Completed Fx Beam Energies  Breast, Left: Breast_Lt 3D 40.05/40.05 2.67 15/15 6X, 10X  Breast, Left: Breast_Lt_Bst 3D 10/10 2 5/5 6X    Pancreatic cancer (  [place])  03/04/2022 Imaging   CT ABDOMEN W CONTRAST   IMPRESSION: 1. Findings highly suspicious for adenocarcinoma in the pancreatic head with resultant upstream atrophy and duct dilatation. Venous encasement as detailed above. No arterial involvement. 2. Suspect  cirrhosis and mild steatosis. 3. Coronary artery atherosclerosis. Aortic Atherosclerosis (ICD10-I70.0).   03/09/2022 Tumor Marker   Patient's tumor was tested for the following markers: CA-19.9. Results of the tumor marker test revealed 1715.   03/18/2022 Procedure   EUS-Dr. Ardis Hughs  1. Irregularly shaped, indistinctly bordered, heterogeneous, hypoechoic mass that measures 3.4 cm maximally in the uncinate/head of pancreas. The mass clearly surrounds and attenuates the portal vein near the splenic vein, SMV confluence. The mass is causing mild biliary duct obstruction with the common bile duct measuring 8.6 mm. The mass is also obstructing and dilating the main pancreatic duct which measures up to 9 millimeters in the body and tail. I used a 25-gauge EUS FNB needle to sample the lesion with a transduodenal approach, 2 passes. 2. No peripancreatic adenopathy 3. Limited views of the liver, spleen were normal   03/18/2022 Pathology Results   CYTOLOGY - NON PAP  CASE: [id number]  PATIENT: Jessica Livingston  Non-Gynecological Cytology Report   Clinical History: Cirrhosis, pancreatic mass  Specimen Submitted:  A. PANCREAS, HEAD, FINE NEEDLE ASPIRATION:    FINAL MICROSCOPIC DIAGNOSIS:  - Malignant cells consistent with adenocarcinoma   SPECIMEN ADEQUACY:  Satisfactory for evaluation   IMMEDIATE EVALUATION:  SUFFICIENT, POSITIVE FOR MALIGNANCY CONSISTENT WITH ADENO CA. (MPL)    03/18/2022 Cancer Staging   Staging form: Exocrine Pancreas, AJCC 8th Edition - Clinical stage from 03/18/2022: Stage IV (cT2, cN1, cM1) - Signed by Truitt Merle, MD on 04/02/2022   03/23/2022 Initial Diagnosis   Pancreatic cancer ([place])   04/02/2022 - 04/16/2022 Chemotherapy   Patient is on Treatment Plan : PANCREATIC Abraxane / Gemcitabine D1,15 q28d      Genetic Testing   [employer] CancerNext-Expanded Panel was Negative. Report date is 04/03/2022.  The CancerNext-Expanded gene panel offered by [facility] and  includes sequencing, rearrangement, and RNA analysis for the following 77 genes: AIP, ALK, APC, ATM, AXIN2, BAP1, BARD1, BLM, BMPR1A, BRCA1, BRCA2, BRIP1, CDC73, CDH1, CDK4, CDKN1B, CDKN2A, CHEK2, CTNNA1, DICER1, FANCC, FH, FLCN, GALNT12, KIF1B, LZTR1, MAX, MEN1, MET, MLH1, MSH2, MSH3, MSH6, MUTYH, NBN, NF1, NF2, NTHL1, PALB2, PHOX2B, PMS2, POT1, PRKAR1A, PTCH1, PTEN, RAD51C, RAD51D, RB1, RECQL, RET, SDHA, SDHAF2, SDHB, SDHC, SDHD, SMAD4, SMARCA4, SMARCB1, SMARCE1, STK11, SUFU, TMEM127, TP53, TSC1, TSC2, VHL and XRCC2 (sequencing and deletion/duplication); EGFR, EGLN1, HOXB13, KIT, MITF, PDGFRA, POLD1, and POLE (sequencing only); EPCAM and GREM1 (deletion/duplication only).    04/02/2022 -  Chemotherapy   Patient is on Treatment Plan : PANCREATIC Abraxane D1,8,15 + Gemcitabine D1,8,15 q28d        INTERVAL HISTORY:  Jessica Livingston is here for a follow up of  pancreatic cancer . She was last seen by Dr. Truitt Merle on 08/20/2022. She presents to the clinic with family friend, pt reports low energy level unable to do anything but sleep, pt is taking pain meds then going to bed to sleep it off,  Pt reports having nausea due to cancer and taking meds for it, pt reports feeling betyter since she come off of chemo, she is able to care for herself at home, pt doesn't feel that radiation has helped with the pain,    All other systems were reviewed with the patient and are negative.  MEDICAL HISTORY:  Past  Medical History:  Diagnosis Date   breast cancer 2022   Left   CAD (coronary artery disease)    Cataract 2018   had sx   Depression    Family history of breast cancer    Heart disease '   History of radiation therapy    Left breast 05/21/21-06/18/21- Dr. Gery Pray   History of radiation therapy    Pancreas-08/16/22-08/26/22- Dr. Gery Pray   HLD (hyperlipidemia)    Obesity, unspecified    Other abnormal glucose    Other B-complex deficiencies    Other seborrheic keratosis    Pancreatic  cancer ([place]) 02/2022   Postmenopausal     SURGICAL HISTORY: Past Surgical History:  Procedure Laterality Date   BIOPSY  07/26/2022   Procedure: BIOPSY;  Surgeon: Irving Copas., MD;  Location: [facility];  Service: Gastroenterology;;   BREAST LUMPECTOMY WITH RADIOACTIVE SEED LOCALIZATION Left 04/16/2021   Procedure: LEFT BREAST LUMPECTOMY WITH RADIOACTIVE SEED LOCALIZATION;  Surgeon: Jovita Kussmaul, MD;  Location: [facility];  Service: General;  Laterality: Left;   CARDIAC CATHETERIZATION  5/03   mild to mod single vessel disease   CHOLECYSTECTOMY     COLONOSCOPY  2018   HPP/TA   ESOPHAGOGASTRODUODENOSCOPY (EGD) WITH PROPOFOL N/A 03/18/2022   Procedure: ESOPHAGOGASTRODUODENOSCOPY (EGD) WITH PROPOFOL;  Surgeon: [name], MD;  Location: [facility];  Service: Gastroenterology;  Laterality: N/A;   ESOPHAGOGASTRODUODENOSCOPY (EGD) WITH PROPOFOL N/A 07/26/2022   Procedure: ESOPHAGOGASTRODUODENOSCOPY (EGD) WITH PROPOFOL;  Surgeon: Rush Landmark Telford Nab., MD;  Location: [facility];  Service: Gastroenterology;  Laterality: N/A;   EUS N/A 03/18/2022   Procedure: ESOPHAGEAL ENDOSCOPIC ULTRASOUND (EUS) RADIAL;  Surgeon: [name], MD;  Location: [facility];  Service: Gastroenterology;  Laterality: N/A;   EUS N/A 07/26/2022   Procedure: UPPER ENDOSCOPIC ULTRASOUND (EUS) LINEAR;  Surgeon: Irving Copas., MD;  Location: [facility];  Service: Gastroenterology;  Laterality: N/A;   FIDUCIAL MARKER PLACEMENT N/A 07/26/2022   Procedure: FIDUCIAL MARKER PLACEMENT;  Surgeon: Irving Copas., MD;  Location: [facility];  Service: Gastroenterology;  Laterality: N/A;   FINE NEEDLE ASPIRATION N/A 03/18/2022   Procedure: FINE NEEDLE ASPIRATION (FNA) LINEAR;  Surgeon: [name], MD;  Location: [facility];  Service: Gastroenterology;  Laterality: N/A;   IR IMAGING GUIDED PORT INSERTION  03/31/2022   LAPAROSCOPIC GASTRIC BANDING  01/13/2010   NECK SURGERY      POLYPECTOMY  2018   TONSILLECTOMY     TUBAL LIGATION Bilateral     I have reviewed the social history and family history with the patient and they are unchanged from previous note.  ALLERGIES:  is allergic to atorvastatin and rosuvastatin.  MEDICATIONS:  Current Outpatient Medications  Medication Sig Dispense Refill   Besifloxacin HCl (BESIVANCE) 0.6 % SUSP Place 1 drop into both eyes See admin instructions. Instill 1 drop into both eyes 3 times daily once a month on the day of monthly eye injections     Cholecalciferol (VITAMIN D) 50 MCG (2000 UT) CAPS Take 50 capsules by mouth once. (Patient not taking: Reported on 09/27/2022)     cyanocobalamin (VITAMIN B12) 1000 MCG tablet Take 1,000 mcg by mouth daily.     HYDROcodone-acetaminophen (NORCO) 5-325 MG tablet Take 2 tablets by mouth every 4 (four) hours as needed for moderate pain. 180 tablet 0   levothyroxine (SYNTHROID) 50 MCG tablet Take 50 mcg by mouth daily before breakfast. (Patient not taking: Reported on 09/27/2022)     lipase/protease/amylase (CREON) 36000  UNITS CPEP capsule Take 1 capsule (36,000 Units total) by mouth 3 (three) times daily with meals AND 1 capsule (36,000 Units total) 3 (three) times daily before meals. (Patient not taking: Reported on 07/28/2022) 60 capsule 0   omeprazole (PRILOSEC) 20 MG capsule Take 20 mg by mouth daily. (Patient not taking: Reported on 09/27/2022)     ondansetron (ZOFRAN) 8 MG tablet Take 1 tablet (8 mg total) by mouth every 8 (eight) hours as needed for nausea or vomiting. 30 tablet 1   ondansetron (ZOFRAN-ODT) 4 MG disintegrating tablet Take 1 tablet (4 mg total) by mouth every 8 (eight) hours as needed for nausea or vomiting. (Patient not taking: Reported on 09/27/2022) 20 tablet 0   oxyCODONE ER (XTAMPZA ER) 9 MG C12A Take 9 mg by mouth every 12 (twelve) hours. 30 capsule 0   prochlorperazine (COMPAZINE) 10 MG tablet Take 1 tablet (10 mg total) by mouth every 6 (six) hours as needed. 30 tablet 2    No current facility-administered medications for this visit.    PHYSICAL EXAMINATION: ECOG PERFORMANCE STATUS: 2 - Symptomatic, <50% confined to bed  Vitals:   09/27/22 1112  BP: 125/76  Pulse: 87  Resp: 19  Temp: 98 F (36.7 C)  SpO2: 96%   Wt Readings from Last 3 Encounters:  09/27/22 157 lb (71.2 kg)  09/27/22 157 lb 12.8 oz (71.6 kg)  09/15/22 160 lb 11.2 oz (72.9 kg)     GENERAL:alert, no distress and comfortable SKIN: skin color normal, no rashes or significant lesions EYES: normal, Conjunctiva are pink and non-injected, sclera clear  NEURO: alert & oriented x 3 with fluent speech   LABORATORY DATA:  I have reviewed the data as listed    Latest Ref Rng & Units 09/27/2022   10:54 AM 08/20/2022    1:07 PM 06/25/2022    8:29 AM  CBC  WBC 4.0 - 10.5 K/uL 5.8  5.7  3.7   Hemoglobin 12.0 - 15.0 g/dL 14.3  14.5  11.7   Hematocrit 36.0 - 46.0 % 42.6  43.3  34.9   Platelets 150 - 400 K/uL 204  197  209         Latest Ref Rng & Units 09/27/2022   10:54 AM 08/20/2022    1:07 PM 06/25/2022    8:29 AM  CMP  Glucose 70 - 99 mg/dL 136  133  149   BUN 8 - 23 mg/dL '11  13  7   '$ Creatinine 0.44 - 1.00 mg/dL 0.65  0.63  0.54   Sodium 135 - 145 mmol/L 139  138  138   Potassium 3.5 - 5.1 mmol/L 3.4  4.1  4.1   Chloride 98 - 111 mmol/L 101  101  104   CO2 22 - 32 mmol/L '31  31  30   '$ Calcium 8.9 - 10.3 mg/dL 8.7  9.3  8.5   Total Protein 6.5 - 8.1 g/dL 6.2  7.0  6.4   Total Bilirubin 0.3 - 1.2 mg/dL 0.6  0.7  0.6   Alkaline Phos 38 - 126 U/L 97  99  90   AST 15 - 41 U/L '23  27  29   '$ ALT 0 - 44 U/L '10  12  21       '$ RADIOGRAPHIC STUDIES: I have personally reviewed the radiological images as listed and agreed with the findings in the report. No results found.    Orders Placed This Encounter  Procedures   Do not  attempt resuscitation (DNR)    Order Specific Question:   If patient has no pulse and is not breathing    Answer:   Do Not Attempt Resuscitation     Order Specific Question:   If patient has a pulse and/or is breathing: Medical Treatment Goals    Answer:   LIMITED ADDITIONAL INTERVENTIONS: Use medication/IV fluids and cardiac monitoring as indicated; Do not use intubation or mechanical ventilation (DNI), also provide comfort medications.  Transfer to Progressive/Stepdown as indicated, avoid Intensive Care.    Order Specific Question:   Consent:    Answer:   Discussion documented in EHR or advanced directives reviewed   All questions were answered. The patient knows to call the clinic with any problems, questions or concerns. No barriers to learning was detected. The total time spent in the appointment was 30 minutes.     Truitt Merle, MD 09/27/2022   I, Maurine Simmering, CMA, am acting as scribe for Truitt Merle, MD.   I have reviewed the above documentation for accuracy and completeness, and I agree with the above.

## 2022-09-28 ENCOUNTER — Encounter: Payer: Self-pay | Admitting: Nurse Practitioner

## 2022-09-28 ENCOUNTER — Other Ambulatory Visit: Payer: Self-pay | Admitting: Nurse Practitioner

## 2022-09-28 DIAGNOSIS — G893 Neoplasm related pain (acute) (chronic): Secondary | ICD-10-CM

## 2022-09-28 DIAGNOSIS — C259 Malignant neoplasm of pancreas, unspecified: Secondary | ICD-10-CM

## 2022-09-28 MED ORDER — XTAMPZA ER 9 MG PO C12A: 9.0000 mg | EXTENDED_RELEASE_CAPSULE | Freq: Two times a day (BID) | ORAL | 0 refills | Status: DC

## 2022-09-28 NOTE — Telephone Encounter (Signed)
From: Alycia Rossetti To: Jobe Gibbon, NP Sent: 09/28/2022 8:12 AM EST Subject: Medication Renewal Request  Refills have been requested for the following medications:   oxyCODONE ER (XTAMPZA ER) 9 MG C12A [facility]]  Preferred pharmacy: [facility], [place] Delivery method: [employer]

## 2022-09-29 LAB — CANCER ANTIGEN 19-9: CA 19-9: 2089 U/mL — ABNORMAL HIGH (ref 0–35)

## 2022-10-01 ENCOUNTER — Encounter (INDEPENDENT_AMBULATORY_CARE_PROVIDER_SITE_OTHER): Payer: PPO | Admitting: Ophthalmology

## 2022-10-04 ENCOUNTER — Inpatient Hospital Stay: Payer: PPO | Attending: Hematology | Admitting: Nurse Practitioner

## 2022-10-04 DIAGNOSIS — C259 Malignant neoplasm of pancreas, unspecified: Secondary | ICD-10-CM

## 2022-10-04 DIAGNOSIS — C250 Malignant neoplasm of head of pancreas: Secondary | ICD-10-CM

## 2022-10-04 DIAGNOSIS — Z515 Encounter for palliative care: Secondary | ICD-10-CM

## 2022-10-04 DIAGNOSIS — G893 Neoplasm related pain (acute) (chronic): Secondary | ICD-10-CM

## 2022-10-04 DIAGNOSIS — D0512 Intraductal carcinoma in situ of left breast: Secondary | ICD-10-CM

## 2022-10-04 DIAGNOSIS — R530 Neoplastic (malignant) related fatigue: Secondary | ICD-10-CM

## 2022-10-04 DIAGNOSIS — Z7189 Other specified counseling: Secondary | ICD-10-CM | POA: Diagnosis not present

## 2022-10-04 DIAGNOSIS — R630 Anorexia: Secondary | ICD-10-CM

## 2022-10-04 DIAGNOSIS — R531 Weakness: Secondary | ICD-10-CM

## 2022-10-05 ENCOUNTER — Encounter: Payer: Self-pay | Admitting: Nurse Practitioner

## 2022-10-05 ENCOUNTER — Inpatient Hospital Stay: Payer: PPO | Admitting: Nurse Practitioner

## 2022-10-05 ENCOUNTER — Encounter: Payer: Self-pay | Admitting: Hematology

## 2022-10-05 ENCOUNTER — Other Ambulatory Visit: Payer: Self-pay

## 2022-10-05 NOTE — Progress Notes (Signed)
[place]  [phone] [phone]   Name: Jessica Livingston Date: 10/05/2022 MRN: [id number]  DOB: 10/19/1940  Patient Care Team: Deland Pretty, MD as PCP - General (Internal Medicine) Mauro Kaufmann, RN as Oncology Nurse Navigator Rockwell Germany, RN as Oncology Nurse Navigator Jovita Kussmaul, MD as Consulting Physician (General Surgery) Nicholas Lose, MD as Consulting Physician (Hematology and Oncology) Gery Pray, MD as Consulting Physician (Radiation Oncology) Delice Bison, Charlestine Massed, NP as Nurse Practitioner (Hematology and Oncology) Harmon Pier, RN as Registered Nurse Truitt Merle, MD as Consulting Physician (Oncology) Royston Bake, RN as Oncology Nurse Navigator (Oncology) Pickenpack-Cousar, Carlena Sax, NP as Nurse Practitioner (Nurse Practitioner)   I connected with Jessica Livingston on 10/04/22 at  2:30 PM EST by phone and verified that I am speaking with the correct person using two identifiers.   I discussed the limitations, risks, security and privacy concerns of performing an evaluation and management service by telemedicine and the availability of in-person appointments. I also discussed with the patient that there may be a patient responsible charge related to this service. The patient expressed understanding and agreed to proceed.   Other persons participating in the visit and their role in the encounter: Maygan, RN    Patient's location: Home   Provider's location: [place]   Chief Complaint: Symptom Management/GOC   INTERVAL HISTORY: Jessica Livingston is a 82 y.o. 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 S/p lumpectomy 04/16/21, path showed DCIS. Treated with adjuvant radiation, she declined antiestrogen therapy. Abraxane/gemzar for her pancreatic cancer (last dose 06/21/22), she started consolidation SBRT 08/13/2022. Palliative  ask to see for symptom and pain management and goals of care.   SOCIAL HISTORY:     reports that she has never smoked. She has never used smokeless tobacco. She reports current alcohol use. She reports that she does not use drugs.  ADVANCE DIRECTIVES:  None on file   CODE STATUS: DNR  PAST MEDICAL HISTORY: Past Medical History:  Diagnosis Date   breast cancer 2022   Left   CAD (coronary artery disease)    Cataract 2018   had sx   Depression    Family history of breast cancer    Heart disease '   History of radiation therapy    Left breast 05/21/21-06/18/21- Dr. Gery Pray   History of radiation therapy    Pancreas-08/16/22-08/26/22- Dr. Gery Pray   HLD (hyperlipidemia)    Obesity, unspecified    Other abnormal glucose    Other B-complex deficiencies    Other seborrheic keratosis    Pancreatic cancer ([place]) 02/2022   Postmenopausal     ALLERGIES:  is allergic to atorvastatin and rosuvastatin.  MEDICATIONS:  Current Outpatient Medications  Medication Sig Dispense Refill   Besifloxacin HCl (BESIVANCE) 0.6 % SUSP Place 1 drop into both eyes See admin instructions. Instill 1 drop into both eyes 3 times daily once a month on the day of monthly eye injections     Cholecalciferol (VITAMIN D) 50 MCG (2000 UT) CAPS Take 50 capsules by mouth once. (Patient not taking: Reported on 09/27/2022)     cyanocobalamin (VITAMIN B12) 1000 MCG tablet Take 1,000 mcg by mouth daily.     HYDROcodone-acetaminophen (NORCO) 5-325 MG tablet Take 2 tablets by mouth every 4 (four) hours as needed for moderate pain. 180 tablet 0   levothyroxine (SYNTHROID) 50 MCG tablet  Take 50 mcg by mouth daily before breakfast. (Patient not taking: Reported on 09/27/2022)     lipase/protease/amylase (CREON) 36000 UNITS CPEP capsule Take 1 capsule (36,000 Units total) by mouth 3 (three) times daily with meals AND 1 capsule (36,000 Units total) 3 (three) times daily before meals. (Patient not taking: Reported on  07/28/2022) 60 capsule 0   omeprazole (PRILOSEC) 20 MG capsule Take 20 mg by mouth daily. (Patient not taking: Reported on 09/27/2022)     ondansetron (ZOFRAN) 8 MG tablet Take 1 tablet (8 mg total) by mouth every 8 (eight) hours as needed for nausea or vomiting. 30 tablet 1   ondansetron (ZOFRAN-ODT) 4 MG disintegrating tablet Take 1 tablet (4 mg total) by mouth every 8 (eight) hours as needed for nausea or vomiting. (Patient not taking: Reported on 09/27/2022) 20 tablet 0   oxyCODONE ER (XTAMPZA ER) 9 MG C12A Take 9 mg by mouth every 12 (twelve) hours. 60 capsule 0   prochlorperazine (COMPAZINE) 10 MG tablet Take 1 tablet (10 mg total) by mouth every 6 (six) hours as needed. 30 tablet 2   No current facility-administered medications for this visit.    VITAL SIGNS: There were no vitals taken for this visit. There were no vitals filed for this visit.  Estimated body mass index is 27.81 kg/m as calculated from the following:   Height as of 09/27/22: '5\' 3"'$  (1.6 m).   Weight as of 09/27/22: 157 lb (71.2 kg).   PERFORMANCE STATUS (ECOG) : 1 - Symptomatic but completely ambulatory   IMPRESSION: I connected with Jessica Livingston by phone for follow-up. No acute distress noted. Patient shares she continues to life one day at a time. Shares her quality of life continues to decline. She does not wish to suffer.   We discussed Her current illness and what it means in the larger context of Her on-going co-morbidities. Natural disease trajectory and expectations were discussed.  Jessica Livingston is realistic in her understanding of poor prognosis and expectations in relation to her disease trajectory. She wishes to focus solely on her comfort managing symptoms aggressively. States her body is continuing to grow weaker. She is no longer undergoing any form of cancer treatments. She and her family are at peace with her decisions.   Jessica Livingston shares her daughter is a Emergency planning/management officer. They have began discussions with [facility] as they have a relationship with Jessica Breech, RN [facility]ia[facility]). Patient expresses wishes to enroll with their hospice program.   Extensive education provided on hospice's goals, philosophy of care, expectations in the home, referral process, and symptom management. Patient verbalized understanding again confirming wishes to enroll in hospice. She understands the medical team is available for support if needed.   Pain is well controlled on current regimen. Denies nausea, vomiting, constipation, or diarrhea. Endorses ongoing fatigue and decreased appetite.   I discussed the importance of continued conversation with family and their medical providers regarding overall plan of care and treatment options, ensuring decisions are within the context of the patients values and GOCs.  PLAN: Extensive goals of care discussion. Patient is clear in expressed wishes to focus on her comfort at home. Request for hospice referral to [facility] per personal choice.  DNR/DNI  Aggressive symptom management Referral sent to [facility] home hospice division. Maygan, RN has spoken with Jessica Breech, RN.  Patient aware team is available as needed. No future appointments set.    Patient expressed understanding and was in agreement with this plan. She  also understands that She can call the clinic at any time with any questions, concerns, or complaints.   Any controlled substances utilized were prescribed in the context of palliative care. PDMP has been reviewed.   Time Total: 50 min   Visit consisted of counseling and education dealing with the complex and emotionally intense issues of symptom management and palliative care in the setting of serious and potentially life-threatening illness.Greater than 50%  of this time was spent counseling and coordinating care related to the above assessment and plan.  Alda Lea, AGPCNP-BC  Palliative Medicine Team/[facility]

## 2022-11-01 ENCOUNTER — Other Ambulatory Visit: Payer: Self-pay

## 2022-11-19 ENCOUNTER — Other Ambulatory Visit (HOSPITAL_COMMUNITY): Payer: PPO

## 2022-12-29 DEATH — deceased
